# Patient Record
Sex: Female | Born: 1965 | Race: White | Hispanic: No | Marital: Married | Smoking: Current every day smoker
Health system: Southern US, Community
[De-identification: ages and names within clinical notes are randomized; demographics above are authoritative.]

## PROBLEM LIST (undated history)

## (undated) DIAGNOSIS — S42009A Fracture of unspecified part of unspecified clavicle, initial encounter for closed fracture: Secondary | ICD-10-CM

## (undated) DIAGNOSIS — I10 Essential (primary) hypertension: Secondary | ICD-10-CM

## (undated) DIAGNOSIS — Z8701 Personal history of pneumonia (recurrent): Secondary | ICD-10-CM

## (undated) DIAGNOSIS — F411 Generalized anxiety disorder: Secondary | ICD-10-CM

## (undated) DIAGNOSIS — F329 Major depressive disorder, single episode, unspecified: Secondary | ICD-10-CM

## (undated) DIAGNOSIS — F32A Depression, unspecified: Secondary | ICD-10-CM

## (undated) HISTORY — DX: Depression, unspecified: F32.A

## (undated) HISTORY — DX: Essential (primary) hypertension: I10

## (undated) HISTORY — DX: Major depressive disorder, single episode, unspecified: F32.9

## (undated) HISTORY — DX: Generalized anxiety disorder: F41.1

## (undated) HISTORY — DX: Personal history of pneumonia (recurrent): Z87.01

## (undated) HISTORY — PX: EYE SURGERY: SHX253

## (undated) HISTORY — PX: BREAST SURGERY: SHX581

---

## 2001-08-23 ENCOUNTER — Emergency Department (HOSPITAL_COMMUNITY): Admission: EM | Admit: 2001-08-23 | Discharge: 2001-08-23 | Payer: Self-pay | Admitting: *Deleted

## 2012-03-25 ENCOUNTER — Encounter: Payer: Self-pay | Admitting: Gastroenterology

## 2012-03-25 ENCOUNTER — Other Ambulatory Visit: Payer: Self-pay

## 2012-03-25 ENCOUNTER — Ambulatory Visit (INDEPENDENT_AMBULATORY_CARE_PROVIDER_SITE_OTHER): Payer: Self-pay | Admitting: Gastroenterology

## 2012-03-25 VITALS — BP 164/100 | HR 64 | Ht 65.0 in | Wt 189.4 lb

## 2012-03-25 DIAGNOSIS — I10 Essential (primary) hypertension: Secondary | ICD-10-CM

## 2012-03-25 DIAGNOSIS — R197 Diarrhea, unspecified: Secondary | ICD-10-CM

## 2012-03-25 DIAGNOSIS — R109 Unspecified abdominal pain: Secondary | ICD-10-CM

## 2012-03-25 MED ORDER — MOVIPREP 100 G PO SOLR: 1.0000 | ORAL | Status: DC

## 2012-03-25 NOTE — Patient Instructions (Addendum)
One of your biggest health concerns is your smoking.  This increases your risk for most cancers and serious cardiovascular diseases such as strokes, heart attacks.  You should try your best to stop.  If you need assistance, please contact your PCP or Smoking Cessation Class at [facility] [phone]) or [facility] Quit-Line (1-800-QUIT-NOW). WE will get records from Dr. Mellody Life office (labs recently). You will be set up for a colonoscopy (LEC) for diarrhea, minor rectal bleeding. You will have labs checked today in the basement lab.  Please head down after you check out with the front desk  (celiac panel), if this is positive then you will need EGD at same time as colonoscopy. Start taking imodium (1-2 pills) every morning shortly after waking up.  Don't wait for diarrhea to happen.

## 2012-03-25 NOTE — Progress Notes (Signed)
HPI: This is a   very pleasant 46 year old woman whom I am meeting for the first time today.  She has had contstant diarrhea, 8-10 times a day.  4-5 days in a week since xmas.   Prior to xmas she had diarrhea about once a week.  Never nocturnal symptoms.  Mornings are the worst.  She has been taking some of her mother pills (they were very tiny white pills, not imodium).  She takes imodium 4-5 times a week, 4 pills at that time.  She has seen blood in her stool.  Never colonoscopy.  Over a year since any antibiotics.  She had blood work earlier this week by her primary care office and tells me her potassium level was low, her thyroid was normal. I do not have those records.  Pains, post prandial pains.   Lower abd pains.  Not a cramping, but  A bloating.  Sort of a muscular soreness.   Not related to moving her bowels.  Gaining weight.  Her grandfather had colon cancer.  Drinks infrequent caffeine.  Drinks one glass of wine at dinner.      Review of systems: Pertinent positive and negative review of systems were noted in the above HPI section. Complete review of systems was performed and was otherwise normal.    Past Medical History  Diagnosis Date  . Anxiety state, unspecified   . Essential hypertension, benign   . Depression   . History of pneumonia     Past Surgical History  Procedure Date  . Eye surgery     left    Current Outpatient Prescriptions  Medication Sig Dispense Refill  . ALPRAZolam (XANAX) 0.5 MG tablet Take 0.5 mg by mouth at bedtime as needed.      Marland Kitchen atenolol (TENORMIN) 50 MG tablet Take by mouth daily.      . Loperamide HCl (IMODIUM A-D PO) Take by mouth as needed.      Marland Kitchen PARoxetine (PAXIL) 20 MG tablet Take 20 mg by mouth 2 (two) times daily.        Allergies as of 03/25/2012 - Review Complete 03/25/2012  Allergen Reaction Noted  . Codeine  03/25/2012    Family History  Problem Relation Age of Onset  . Breast cancer Paternal Grandmother   .  Breast cancer Mother   . Ovarian cancer Maternal Grandmother   . Prostate cancer Father   . Stomach cancer Cousin   . Colon cancer Paternal Grandfather   . Diabetes Mother     pre-diabetes  . Stroke Maternal Grandfather     History   Social History  . Marital Status: Divorced    Spouse Name: N/A    Number of Children: 0  . Years of Education: N/A   Occupational History  . student    Social History Main Topics  . Smoking status: Current Everyday Smoker    Types: Cigarettes  . Smokeless tobacco: Never Used  . Alcohol Use: Yes     1 per day  . Drug Use: No  . Sexually Active: Not on file   Other Topics Concern  . Not on file   Social History Narrative  . No narrative on file       Physical Exam: BP 164/100  Pulse 64  Ht 5\' 5"  (1.651 m)  Wt 189 lb 6 oz (85.9 kg)  BMI 31.51 kg/m2  LMP 08/26/2011 Constitutional: generally well-appearing Psychiatric: alert and oriented x3 Eyes: extraocular movements intact Mouth: oral pharynx moist, no  lesions Neck: supple no lymphadenopathy Cardiovascular: heart regular rate and rhythm Lungs: clear to auscultation bilaterally Abdomen: soft, nontender, nondistended, no obvious ascites, no peritoneal signs, normal bowel sounds Extremities: no lower extremity edema bilaterally Skin: no lesions on visible extremities    Assessment and plan: 47 y.o. female with  chronic diarrhea, bloating, abdominal pains  We are going to get records sent over from her primary care office. She needs a colonoscopy for her diarrhea, intermittent rectal bleeding. My testing for celiac sprue as well by blood work and if the blood work is positive then she will require EGD at the same time as her colonoscopy. I recommended she start taking Imodium on a daily schedule basis rather than waiting for the diarrhea to happen.

## 2012-03-28 ENCOUNTER — Other Ambulatory Visit: Payer: Self-pay | Admitting: Gastroenterology

## 2012-03-28 ENCOUNTER — Encounter: Payer: Self-pay | Admitting: Gastroenterology

## 2012-03-28 ENCOUNTER — Ambulatory Visit (AMBULATORY_SURGERY_CENTER): Payer: Self-pay | Admitting: Gastroenterology

## 2012-03-28 VITALS — BP 154/101 | HR 90 | Temp 99.0°F | Resp 20 | Ht 65.0 in | Wt 189.0 lb

## 2012-03-28 DIAGNOSIS — D126 Benign neoplasm of colon, unspecified: Secondary | ICD-10-CM

## 2012-03-28 DIAGNOSIS — R197 Diarrhea, unspecified: Secondary | ICD-10-CM

## 2012-03-28 DIAGNOSIS — R109 Unspecified abdominal pain: Secondary | ICD-10-CM

## 2012-03-28 LAB — CELIAC PANEL 10
Gliadin IgA: 8.2 U/mL (ref <20)
Gliadin IgG: 14.1 U/mL (ref <20)
IgA: 160 mg/dL (ref 69–380)

## 2012-03-28 MED ORDER — SODIUM CHLORIDE 0.9 % IV SOLN: 500.0000 mL | INTRAVENOUS | Status: DC

## 2012-03-28 NOTE — Op Note (Signed)
[facility] [address] [place], [place]  [address]  COLONOSCOPY PROCEDURE REPORT  PATIENT:  [name], Tara  MR#:  [id number] BIRTHDATE:  01/09/1966, 45 yrs. old  GENDER:  female ENDOSCOPIST:  Rachael Fee, MD REF. BY:  Marjory Lies, M.D. PROCEDURE DATE:  03/28/2012 PROCEDURE:  Colonoscopy with biopsy ASA CLASS:  Class II INDICATIONS:  chronic diarrhea MEDICATIONS:   Fentanyl 100 mcg IV, These medications were titrated to patient response per physician's verbal order, Versed 10 mg IV  DESCRIPTION OF PROCEDURE:   After the risks benefits and alternatives of the procedure were thoroughly explained, informed consent was obtained.  Digital rectal exam was performed and revealed no rectal masses.   The LB [id number] endoscope was introduced through the anus and advanced to the terminal ileum which was intubated for a short distance, without limitations. The quality of the prep was good..  The instrument was then slowly withdrawn as the colon was fully examined. <<PROCEDUREIMAGES>> FINDINGS:  The terminal ileum appeared normal (see image3).  A normal appearing cecum, ileocecal valve, and appendiceal orifice were identified. The ascending, hepatic flexure, transverse, splenic flexure, descending, sigmoid colon, and rectum appeared unremarkable. The colon was randomly biopsied to check for microsopic colitis, sent to pathology (jar 1) (see image2, image5, and image4).   Retroflexed views in the rectum revealed no abnormalities. COMPLICATIONS:  None  ENDOSCOPIC IMPRESSION: 1) Normal terminal ileum 2) Normal colon, randomly biopsied to check for microscopic colitis  RECOMMENDATIONS: Await final pathology and celiac sprue panel (drawn this past Friday)  ______________________________ Rachael Fee, MD  n. eSIGNED:   Rachael Fee at 03/28/2012 01:53 PM  [name], Tara, [id number]

## 2012-03-28 NOTE — Progress Notes (Signed)
Patient did not experience any of the following events: a burn prior to discharge; a fall within the facility; wrong site/side/patient/procedure/implant event; or a hospital transfer or hospital admission upon discharge from the facility. (G8907) Patient did not have preoperative order for IV antibiotic SSI prophylaxis. (G8918)  

## 2012-03-28 NOTE — Patient Instructions (Addendum)
One of your biggest health concerns is your smoking.  This increases your risk for most cancers and serious cardiovascular diseases such as strokes, heart attacks.  You should try your best to stop.  If you need assistance, please contact your PCP or Smoking Cessation Class at [place] ([phone]) or [place] Quit-Line (1-800-QUIT-NOW).  Discharge instructions given with verbal understanding. Biopsies taken. Resume previous medications. YOU HAD AN ENDOSCOPIC PROCEDURE TODAY AT THE [facility]: Refer to the procedure report that was given to you for any specific questions about what was found during the examination.  If the procedure report does not answer your questions, please call your gastroenterologist to clarify.  If you requested that your care partner not be given the details of your procedure findings, then the procedure report has been included in a sealed envelope for you to review at your convenience later.  YOU SHOULD EXPECT: Some feelings of bloating in the abdomen. Passage of more gas than usual.  Walking can help get rid of the air that was put into your GI tract during the procedure and reduce the bloating. If you had a lower endoscopy (such as a colonoscopy or flexible sigmoidoscopy) you may notice spotting of blood in your stool or on the toilet paper. If you underwent a bowel prep for your procedure, then you may not have a normal bowel movement for a few days.  DIET: Your first meal following the procedure should be a light meal and then it is ok to progress to your normal diet.  A half-sandwich or bowl of soup is an example of a good first meal.  Heavy or fried foods are harder to digest and may make you feel nauseous or bloated.  Likewise meals heavy in dairy and vegetables can cause extra gas to form and this can also increase the bloating.  Drink plenty of fluids but you should avoid alcoholic beverages for 24 hours.  ACTIVITY: Your care partner should take  you home directly after the procedure.  You should plan to take it easy, moving slowly for the rest of the day.  You can resume normal activity the day after the procedure however you should NOT DRIVE or use heavy machinery for 24 hours (because of the sedation medicines used during the test).    SYMPTOMS TO REPORT IMMEDIATELY: A gastroenterologist can be reached at any hour.  During normal business hours, 8:30 AM to 5:00 PM Monday through Friday, call [phone].  After hours and on weekends, please call the GI answering service at [phone] who will take a message and have the physician on call contact you.   Following lower endoscopy (colonoscopy or flexible sigmoidoscopy):  Excessive amounts of blood in the stool  Significant tenderness or worsening of abdominal pains  Swelling of the abdomen that is new, acute  Fever of 100F or higher  FOLLOW UP: If any biopsies were taken you will be contacted by phone or by letter within the next 1-3 weeks.  Call your gastroenterologist if you have not heard about the biopsies in 3 weeks.  Our staff will call the home number listed on your records the next business day following your procedure to check on you and address any questions or concerns that you may have at that time regarding the information given to you following your procedure. This is a courtesy call and so if there is no answer at the home number and we have not heard from you through the emergency   physician on call, we will assume that you have returned to your regular daily activities without incident.  SIGNATURES/CONFIDENTIALITY: You and/or your care partner have signed paperwork which will be entered into your electronic medical record.  These signatures attest to the fact that that the information above on your After Visit Summary has been reviewed and is understood.  Full responsibility of the confidentiality of this discharge information lies with you and/or your  care-partner. 

## 2012-03-29 ENCOUNTER — Telehealth: Payer: Self-pay

## 2012-03-29 NOTE — Telephone Encounter (Signed)
Left message

## 2012-04-01 ENCOUNTER — Telehealth: Payer: Self-pay | Admitting: Gastroenterology

## 2012-04-01 NOTE — Telephone Encounter (Signed)
Cbc, cmet from late June were normal except K 3.0 and alt was 41 (39 is ULN), alphos 128 (104 ULN)  Awaiting biopsies from last week colonoscopy

## 2012-04-05 ENCOUNTER — Encounter: Payer: Self-pay | Admitting: Gastroenterology

## 2012-07-04 ENCOUNTER — Other Ambulatory Visit: Payer: Self-pay | Admitting: Family Medicine

## 2012-07-07 ENCOUNTER — Telehealth (HOSPITAL_COMMUNITY): Payer: Self-pay | Admitting: *Deleted

## 2012-07-07 NOTE — Telephone Encounter (Signed)
Telephoned home # and left message to return call to [facility] 

## 2012-07-08 ENCOUNTER — Other Ambulatory Visit: Payer: Self-pay | Admitting: Family Medicine

## 2012-08-02 ENCOUNTER — Encounter (HOSPITAL_COMMUNITY): Payer: Self-pay | Admitting: *Deleted

## 2012-08-09 ENCOUNTER — Ambulatory Visit (HOSPITAL_COMMUNITY)
Admission: RE | Admit: 2012-08-09 | Discharge: 2012-08-09 | Disposition: A | Discharge status: Home / Self Care | Payer: Self-pay | Source: Ambulatory Visit | Attending: Obstetrics and Gynecology | Admitting: Obstetrics and Gynecology

## 2012-08-09 ENCOUNTER — Other Ambulatory Visit: Payer: Self-pay | Admitting: Obstetrics and Gynecology

## 2012-08-09 ENCOUNTER — Encounter (HOSPITAL_COMMUNITY): Payer: Self-pay

## 2012-08-09 ENCOUNTER — Other Ambulatory Visit: Payer: Self-pay | Admitting: *Deleted

## 2012-08-09 VITALS — BP 128/82 | Temp 98.2°F | Ht 65.0 in | Wt 186.8 lb

## 2012-08-09 DIAGNOSIS — N898 Other specified noninflammatory disorders of vagina: Secondary | ICD-10-CM

## 2012-08-09 DIAGNOSIS — Z01419 Encounter for gynecological examination (general) (routine) without abnormal findings: Secondary | ICD-10-CM

## 2012-08-09 DIAGNOSIS — N63 Unspecified lump in unspecified breast: Secondary | ICD-10-CM

## 2012-08-09 DIAGNOSIS — N6325 Unspecified lump in the left breast, overlapping quadrants: Secondary | ICD-10-CM | POA: Insufficient documentation

## 2012-08-09 DIAGNOSIS — N6311 Unspecified lump in the right breast, upper outer quadrant: Secondary | ICD-10-CM | POA: Insufficient documentation

## 2012-08-09 NOTE — Assessment & Plan Note (Addendum)
Left breast lump that is moveable at 3 o'clock 4 cm from the nipple. Patient referred to the [facility] for bilateral diagnostic mammogram and possible ultrasound. Appointment scheduled for Tuesday, August 16, 2012 at 1350.

## 2012-08-09 NOTE — Addendum Note (Signed)
Encounter addended by: Lurlean Horns, LPN on: 78/29/5621  2:41 PM<BR>     Documentation filed: Orders

## 2012-08-09 NOTE — Addendum Note (Signed)
Encounter addended by: Lurlean Horns, LPN on: 16/06/9603  2:44 PM<BR>     Documentation filed: Orders

## 2012-08-09 NOTE — Patient Instructions (Signed)
Taught patient how to perform BSE and gave educational materials to take home.  Let her know she will need a Pap smear in 1 year if today's Pap smear is normal since she has never had a Pap smear before. Let her know after 3 normal Pap smears [facility] will cover Pap smears every 3 years unless has a history of abnormal Pap smears. Let her know about the free cervical cancer screenings offered by the Cancer Center. Patient referred to the [facility] for bilateral diagnostic mammogram and possible ultrasound. Appointment scheduled for Tuesday, August 16, 2012 at 1350. Patient aware of appointment and will be there. Let patient know will follow up with her within the next couple weeks with results by letter or phone. Discussed smoking cessation with patient. Let her know about the free smoking cessation classes offered by the Cancer Center. Told patient about the Quit line and patient completed a referral form. Patient verbalized understanding.

## 2012-08-09 NOTE — Assessment & Plan Note (Signed)
Wet prep completed.

## 2012-08-09 NOTE — Progress Notes (Signed)
Complaints of left breast lump x 4 months. Per patient she has never had a mammogram.  Pap Smear:    Pap smear completed today. Per patient she has never had a Pap smear.   Physical exam: Breasts Breasts symmetrical. Bilateral breasts under nipple area yeast like appearance. No nipple retraction bilateral breasts. No nipple discharge bilateral breasts. No lymphadenopathy. Palpated moveable lump left breast at 3 o'clock 4 cm from the nipple. Palpated right breast lump at 10 o'clock 3 cm from the nipple. No complaints of pain or tenderness on exam. Patient referred to the [facility] of [facility] for bilateral diagnostic mammogram and possible ultrasound. Appointment scheduled for Tuesday, August 16, 2012 at 1350.          Pelvic/Bimanual   Ext Genitalia No lesions, no swelling and no discharge observed on external genitalia.         Vagina Vagina reddish colored and normal texture. No lesions and thick white discharge observed in vagina. Wet prep completed.          Cervix Cervix is present. Cervix pink and of normal texture. Thick white discharge observed on cervix.     Uterus Uterus is present and palpable. Uterus in normal position and normal size.        Adnexae Bilateral ovaries present and palpable. No tenderness on palpation.          Rectovaginal No rectal exam completed today since patient had no rectal complaints. Skin in buttocks and inner thighs had yeast like appearance.

## 2012-08-09 NOTE — Assessment & Plan Note (Signed)
Right breast lump at 10 o'clock 3 cm from the nipple. Patient referred to the [facility] for bilateral diagnostic mammogram and possible ultrasound. Appointment scheduled for Tuesday, August 16, 2012 at 1350.

## 2012-08-10 ENCOUNTER — Telehealth (HOSPITAL_COMMUNITY): Payer: Self-pay | Admitting: *Deleted

## 2012-08-10 ENCOUNTER — Other Ambulatory Visit: Payer: Self-pay | Admitting: Obstetrics and Gynecology

## 2012-08-10 MED ORDER — NYSTATIN 100000 UNIT/GM EX CREA: TOPICAL_CREAM | Freq: Two times a day (BID) | CUTANEOUS | Status: DC

## 2012-08-10 MED ORDER — FLUCONAZOLE 150 MG PO TABS: 150.0000 mg | ORAL_TABLET | Freq: Once | ORAL | Status: DC

## 2012-08-10 NOTE — Telephone Encounter (Signed)
Telephoned [facility] and called in Rx Nystatin cream and diflucan. Telephoned patient at home # and left message to return call to [facility]

## 2012-08-10 NOTE — Telephone Encounter (Signed)
Patient returned call to [facility] and advised meds were called in to [facility]. Also advised per physician patient would need to be tested for diabetes. Patient voiced understanding.

## 2012-08-11 LAB — WET PREP, GENITAL
Clue Cells Wet Prep HPF POC: NONE SEEN
Trich, Wet Prep: NONE SEEN
Yeast Wet Prep HPF POC: NONE SEEN

## 2012-08-16 ENCOUNTER — Ambulatory Visit
Admission: RE | Admit: 2012-08-16 | Discharge: 2012-08-16 | Disposition: A | Discharge status: Home / Self Care | Payer: No Typology Code available for payment source | Source: Ambulatory Visit | Attending: Obstetrics and Gynecology | Admitting: Obstetrics and Gynecology

## 2012-08-16 DIAGNOSIS — N63 Unspecified lump in unspecified breast: Secondary | ICD-10-CM

## 2012-08-16 IMAGING — MG MM DIGITAL DIAGNOSTIC BILAT
5 series · 5 of 5 positions shown · non-contrast
Comparison: None

CLINICAL DATA: 46-year-old female with palpable lump in the upper
outer left breast discovered on self examination and palpable
thickening in the upper outer right breast discovered on clinical
examination.

DIGITAL DIAGNOSTIC BILATERAL MAMMOGRAM WITH CAD AND BILATERAL
BREAST ULTRASOUND:

[R CC]
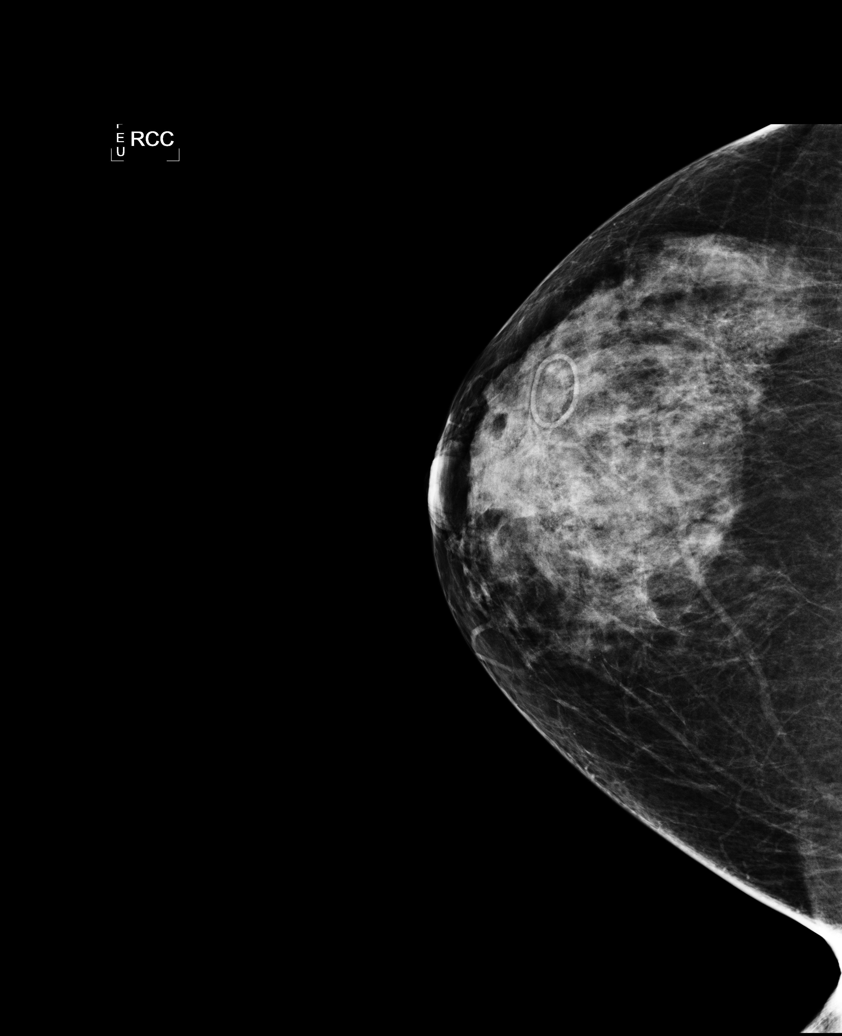

[L CC]
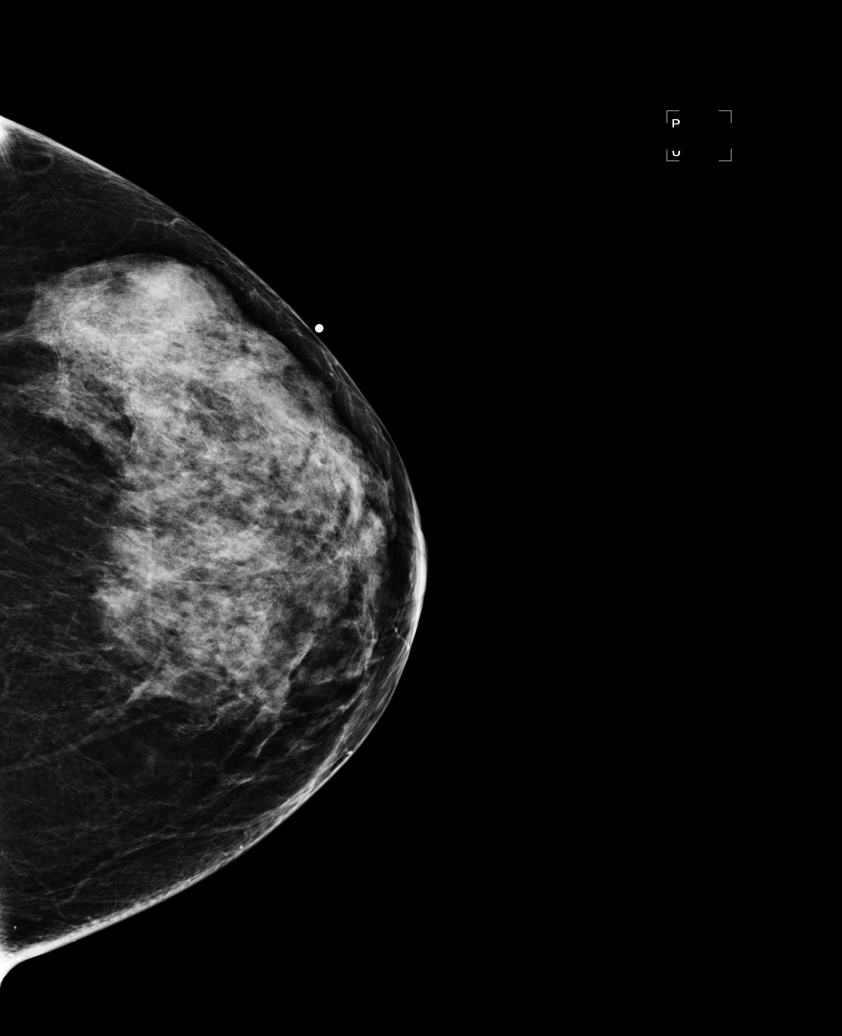

[L MLO]
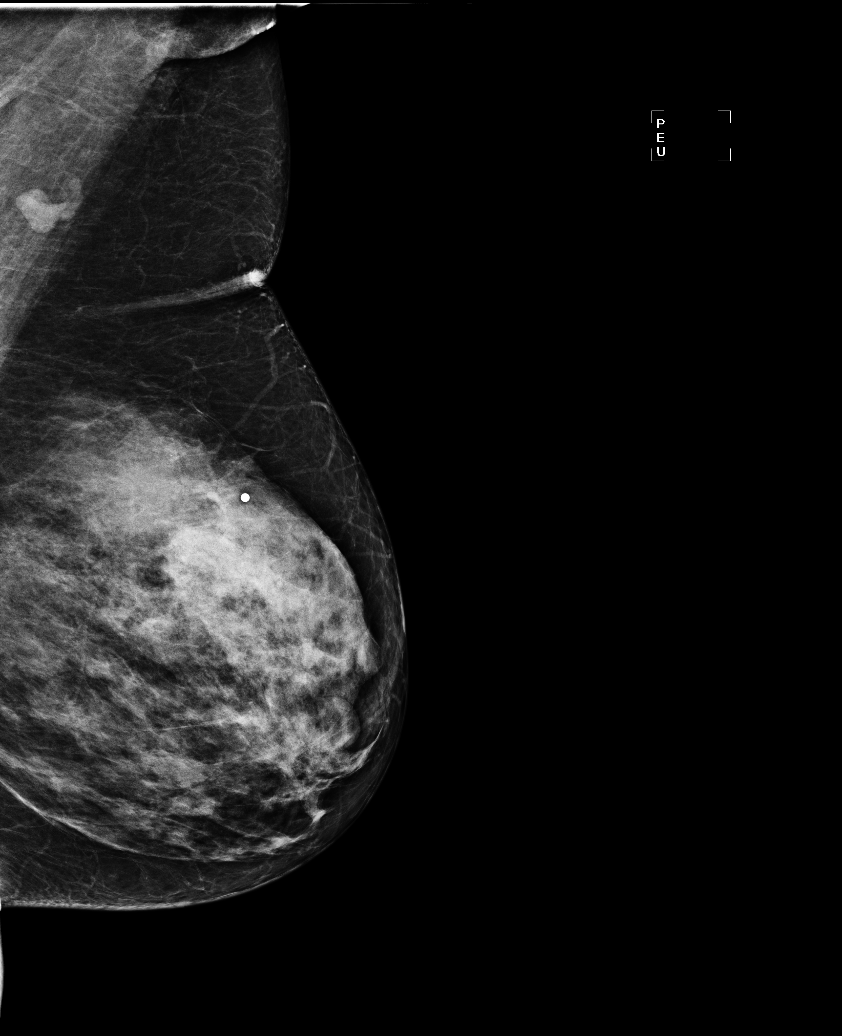

[R MLO]
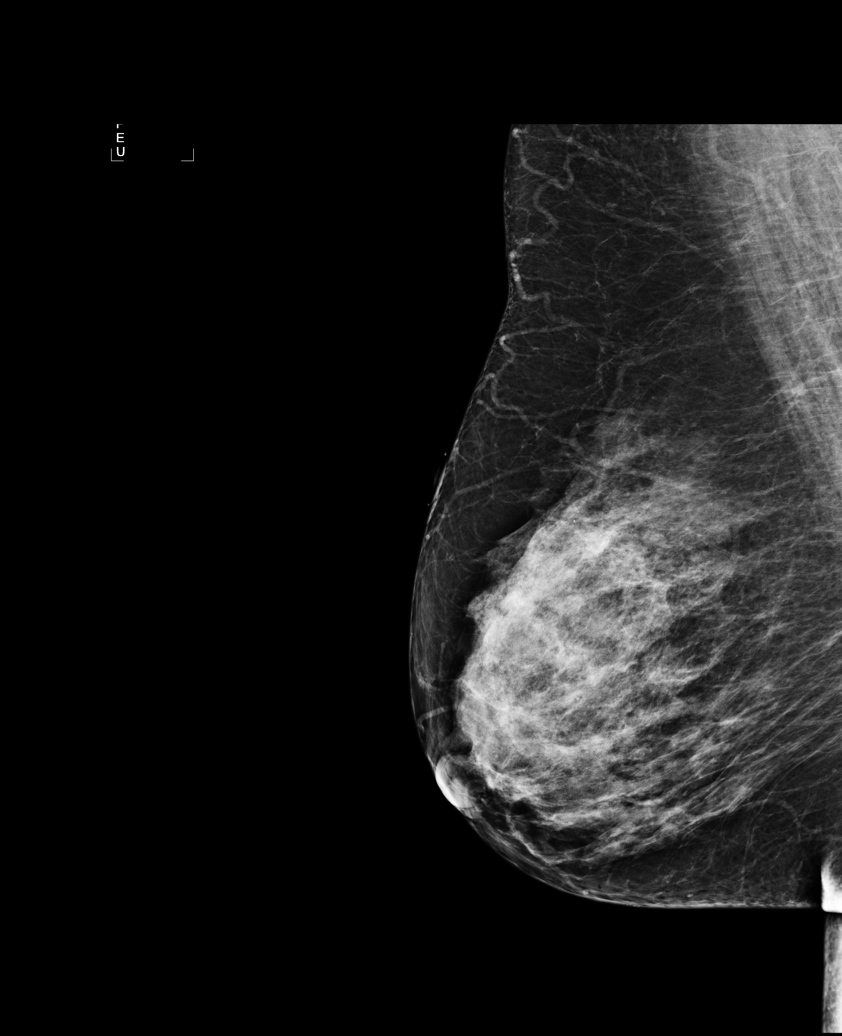

[L TAN]
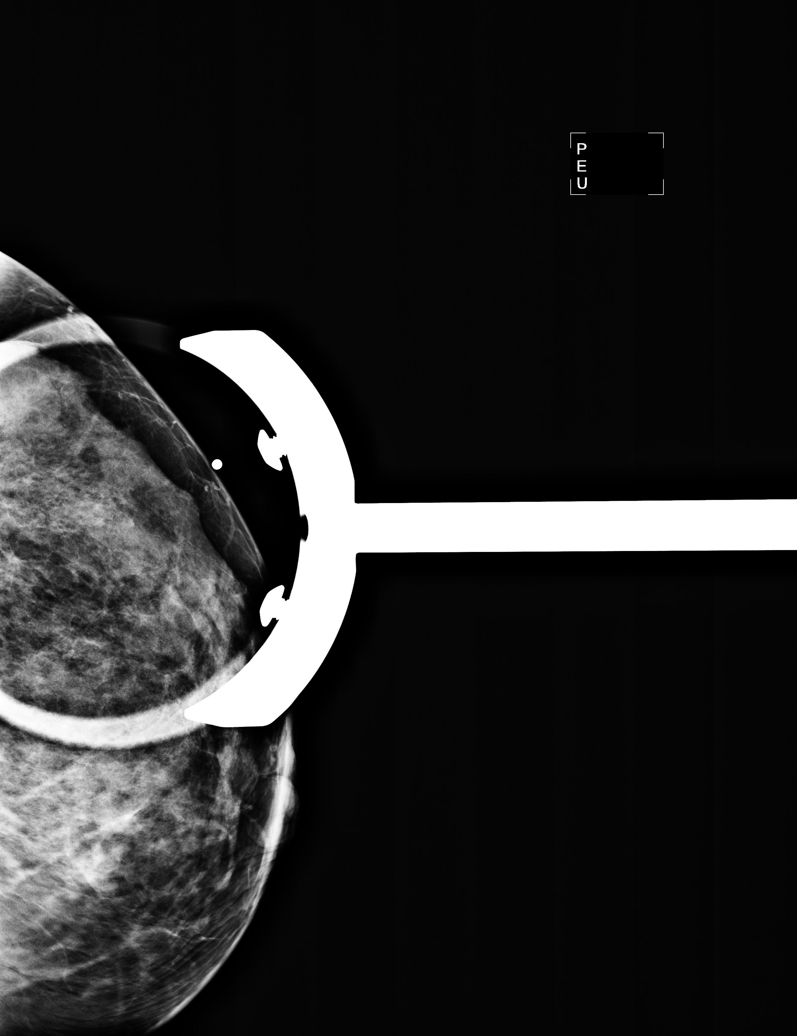

[5 of 5 positions shown; findings below may reference images not displayed]

FINDINGS: A spot compression view of the left breast and routine
views of both breasts demonstrate heterogeneously dense breast
tissue.
A faint circumscribed mass in the upper outer left breast is
present.
No other mass, distortion or suspicious calcifications are noted
bilaterally.
Mammographic images were processed with CAD.

On physical exam, a firm palpable mobile mass is identified at the
2 o'clock position of the left breast 5 cm from the nipple.  There
is no evidence of palpable abnormality within the upper outer right
breast.

Ultrasound is performed, showing a 1.3 x 1 x 1.7 cm complicated
cyst with mobile internal echoes at the 2 o'clock position of the
left breast 5 cm from the nipple.
There is no evidence of solid or cystic mass, distortion or
abnormal areas of shadowing within the upper outer right breast, in
the area of palpable concern.
IMPRESSION: 1.3 x 1 x 1.7 cm benign complicated cyst in the upper outer left
breast, corresponding to the patient's palpable abnormality.

No mammographic, palpable or sonographic abnormality identified
within the upper outer right breast, in the area of palpable
concern.

BI-RADS CATEGORY 2:  Benign finding(s).

RECOMMENDATION:
Bilateral screening mammograms in 1 year

I discussed the findings and recommendations with the patient and
her questions answered.  She was encouraged to begin/continue
monthly self exams and to contact her primary physician if any
changes noted. A written report was given to the patient.

## 2012-09-29 NOTE — Addendum Note (Signed)
Encounter addended by: Saintclair Halsted, RN on: 09/29/2012  4:22 PM<BR>     Documentation filed: Charges VN

## 2014-06-14 ENCOUNTER — Emergency Department (HOSPITAL_COMMUNITY): Payer: Self-pay

## 2014-06-14 ENCOUNTER — Inpatient Hospital Stay (HOSPITAL_COMMUNITY)
Admission: EM | Admit: 2014-06-14 | Discharge: 2014-06-16 | DRG: 641 | Discharge status: Against Medical Advice | Payer: Self-pay | Attending: Internal Medicine | Admitting: Internal Medicine

## 2014-06-14 ENCOUNTER — Encounter (HOSPITAL_COMMUNITY): Payer: Self-pay | Admitting: Emergency Medicine

## 2014-06-14 DIAGNOSIS — E876 Hypokalemia: Principal | ICD-10-CM

## 2014-06-14 DIAGNOSIS — I7389 Other specified peripheral vascular diseases: Secondary | ICD-10-CM | POA: Diagnosis present

## 2014-06-14 DIAGNOSIS — Z79899 Other long term (current) drug therapy: Secondary | ICD-10-CM

## 2014-06-14 DIAGNOSIS — D696 Thrombocytopenia, unspecified: Secondary | ICD-10-CM | POA: Diagnosis present

## 2014-06-14 DIAGNOSIS — I10 Essential (primary) hypertension: Secondary | ICD-10-CM

## 2014-06-14 DIAGNOSIS — F419 Anxiety disorder, unspecified: Secondary | ICD-10-CM

## 2014-06-14 DIAGNOSIS — F1020 Alcohol dependence, uncomplicated: Secondary | ICD-10-CM | POA: Diagnosis present

## 2014-06-14 DIAGNOSIS — R209 Unspecified disturbances of skin sensation: Secondary | ICD-10-CM

## 2014-06-14 DIAGNOSIS — R9431 Abnormal electrocardiogram [ECG] [EKG]: Secondary | ICD-10-CM

## 2014-06-14 DIAGNOSIS — F10239 Alcohol dependence with withdrawal, unspecified: Secondary | ICD-10-CM | POA: Diagnosis present

## 2014-06-14 DIAGNOSIS — F411 Generalized anxiety disorder: Secondary | ICD-10-CM | POA: Diagnosis present

## 2014-06-14 DIAGNOSIS — Z720 Tobacco use: Secondary | ICD-10-CM

## 2014-06-14 DIAGNOSIS — F17200 Nicotine dependence, unspecified, uncomplicated: Secondary | ICD-10-CM | POA: Diagnosis present

## 2014-06-14 DIAGNOSIS — F10939 Alcohol use, unspecified with withdrawal, unspecified: Secondary | ICD-10-CM

## 2014-06-14 LAB — CBC WITH DIFFERENTIAL/PLATELET
Basophils Absolute: 0 10*3/uL (ref 0.0–0.1)
Basophils Relative: 1 % (ref 0–1)
Eosinophils Absolute: 0 10*3/uL (ref 0.0–0.7)
Eosinophils Relative: 1 % (ref 0–5)
HEMATOCRIT: 46.9 % — AB (ref 36.0–46.0)
HEMOGLOBIN: 16.4 g/dL — AB (ref 12.0–15.0)
LYMPHS PCT: 28 % (ref 12–46)
Lymphs Abs: 1.9 10*3/uL (ref 0.7–4.0)
MCH: 37.1 pg — ABNORMAL HIGH (ref 26.0–34.0)
MCHC: 35 g/dL (ref 30.0–36.0)
MCV: 106.1 fL — AB (ref 78.0–100.0)
MONO ABS: 0.5 10*3/uL (ref 0.1–1.0)
MONOS PCT: 7 % (ref 3–12)
NEUTROS ABS: 4.5 10*3/uL (ref 1.7–7.7)
Neutrophils Relative %: 64 % (ref 43–77)
Platelets: 111 10*3/uL — ABNORMAL LOW (ref 150–400)
RBC: 4.42 MIL/uL (ref 3.87–5.11)
RDW: 14.7 % (ref 11.5–15.5)
WBC: 7 10*3/uL (ref 4.0–10.5)

## 2014-06-14 LAB — MAGNESIUM: Magnesium: 0.6 mg/dL — CL (ref 1.5–2.5)

## 2014-06-14 LAB — COMPREHENSIVE METABOLIC PANEL
ALBUMIN: 3.6 g/dL (ref 3.5–5.2)
ALK PHOS: 82 U/L (ref 39–117)
ALT: 13 U/L (ref 0–35)
AST: 24 U/L (ref 0–37)
Anion gap: 14 (ref 5–15)
BILIRUBIN TOTAL: 1.9 mg/dL — AB (ref 0.3–1.2)
BUN: 20 mg/dL (ref 6–23)
CHLORIDE: 97 meq/L (ref 96–112)
CO2: 29 meq/L (ref 19–32)
Calcium: 8.5 mg/dL (ref 8.4–10.5)
Creatinine, Ser: 0.95 mg/dL (ref 0.50–1.10)
GFR calc Af Amer: 83 mL/min — ABNORMAL LOW (ref >90)
GFR, EST NON AFRICAN AMERICAN: 71 mL/min — AB (ref >90)
Glucose, Bld: 82 mg/dL (ref 70–99)
POTASSIUM: 3 meq/L — AB (ref 3.7–5.3)
SODIUM: 140 meq/L (ref 137–147)
Total Protein: 6.3 g/dL (ref 6.0–8.3)

## 2014-06-14 LAB — URINE MICROSCOPIC-ADD ON

## 2014-06-14 LAB — RAPID URINE DRUG SCREEN, HOSP PERFORMED
AMPHETAMINES: NOT DETECTED
BARBITURATES: NOT DETECTED
BENZODIAZEPINES: NOT DETECTED
COCAINE: NOT DETECTED
Opiates: NOT DETECTED
TETRAHYDROCANNABINOL: NOT DETECTED

## 2014-06-14 LAB — URINALYSIS, ROUTINE W REFLEX MICROSCOPIC
GLUCOSE, UA: NEGATIVE mg/dL
HGB URINE DIPSTICK: NEGATIVE
KETONES UR: NEGATIVE mg/dL
Leukocytes, UA: NEGATIVE
Nitrite: NEGATIVE
PH: 5 (ref 5.0–8.0)
PROTEIN: 30 mg/dL — AB
Specific Gravity, Urine: >1.046 — ABNORMAL HIGH (ref 1.005–1.030)
Urobilinogen, UA: 1 mg/dL (ref 0.0–1.0)

## 2014-06-14 LAB — I-STAT CHEM 8, ED
BUN: 19 mg/dL (ref 6–23)
CALCIUM ION: 0.98 mmol/L — AB (ref 1.12–1.23)
CHLORIDE: 91 meq/L — AB (ref 96–112)
Creatinine, Ser: 0.9 mg/dL (ref 0.50–1.10)
GLUCOSE: 96 mg/dL (ref 70–99)
HCT: 54 % — ABNORMAL HIGH (ref 36.0–46.0)
Hemoglobin: 18.4 g/dL — ABNORMAL HIGH (ref 12.0–15.0)
Potassium: 2.5 mEq/L — CL (ref 3.7–5.3)
Sodium: 135 mEq/L — ABNORMAL LOW (ref 137–147)
TCO2: 32 mmol/L (ref 0–100)

## 2014-06-14 LAB — POTASSIUM: Potassium: 2.7 mEq/L — CL (ref 3.7–5.3)

## 2014-06-14 LAB — TSH: TSH: 1.05 u[IU]/mL (ref 0.350–4.500)

## 2014-06-14 LAB — TROPONIN I: Troponin I: <0.3 ng/mL (ref <0.30)

## 2014-06-14 LAB — ETHANOL: Alcohol, Ethyl (B): <11 mg/dL (ref 0–11)

## 2014-06-14 LAB — RPR

## 2014-06-14 IMAGING — CT CT ANGIO CHEST
2 of 6 series · 19 of 36 positions shown · IV contrast (OMNIPAQUE 350)
Comparison: Chest radiograph dated [DATE]

CLINICAL DATA: Shortness of breath, tachycardia

EXAM:
CT ANGIOGRAPHY CHEST WITH CONTRAST
TECHNIQUE: Multidetector CT imaging of the chest was performed using the
standard protocol during bolus administration of intravenous
contrast. Multiplanar CT image reconstructions and MIPs were
obtained to evaluate the vascular anatomy.
CONTRAST:  100mL OMNIPAQUE IOHEXOL 350 MG/ML SOLN

[Series 6: thins for pacs · axial · 0.72mm/px · z∈[-178,+30]mm · 18 of 231 slices shown]
[im 12/231  lung]
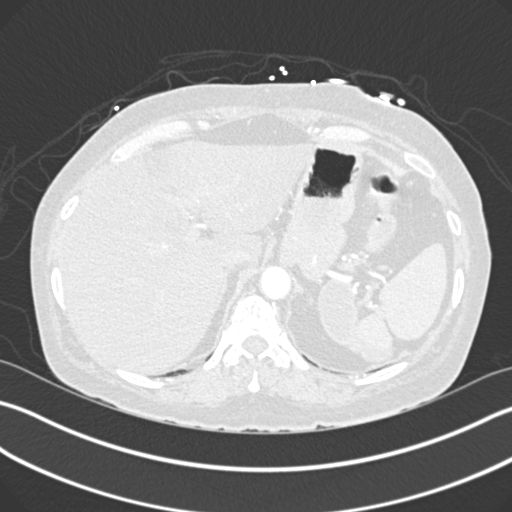
[im 24/231  mediastinal]
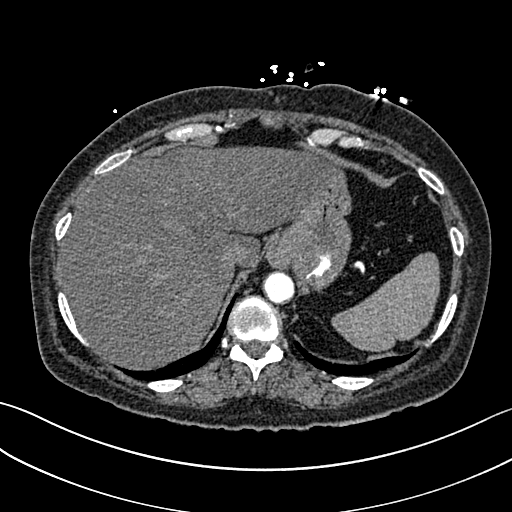
[im 35/231  lung]
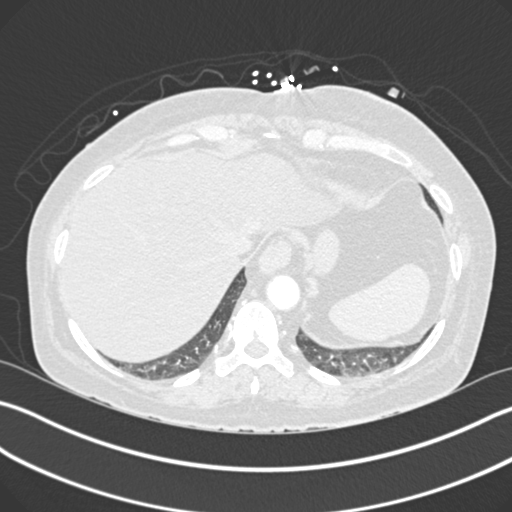
[im 47/231  mediastinal]
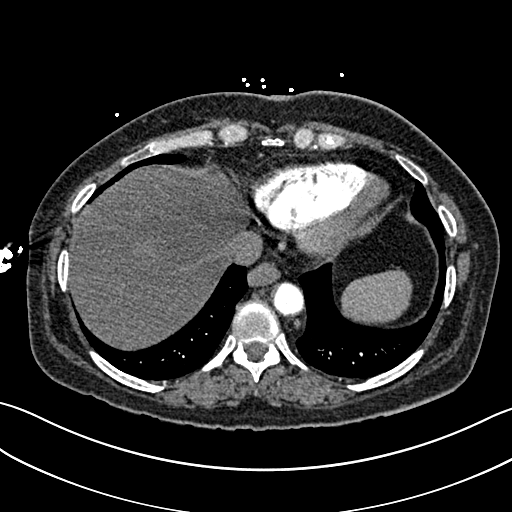
[im 58/231  lung]
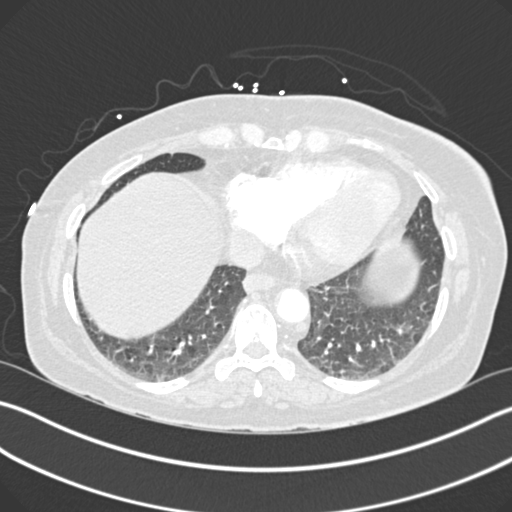
[im 70/231  mediastinal]
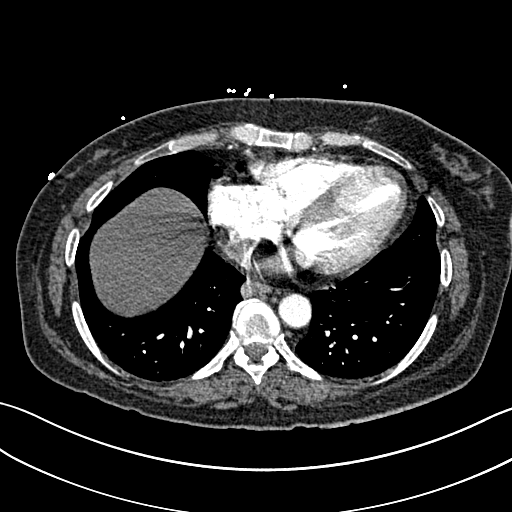
[im 81/231  lung]
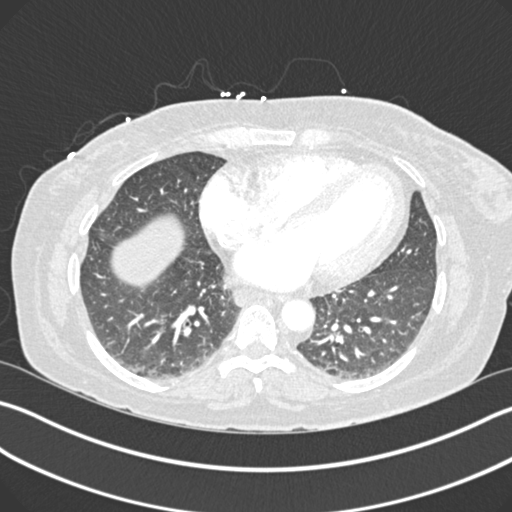
[im 93/231  mediastinal]
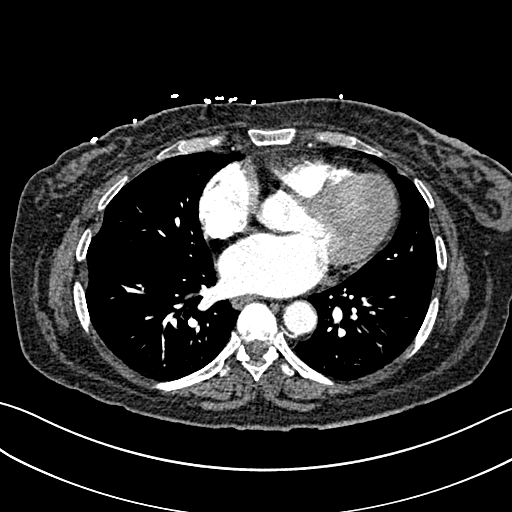
[im 104/231  lung]
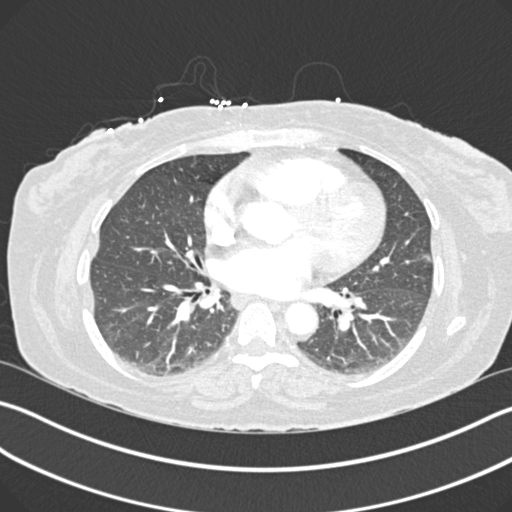
[im 127/231  mediastinal]
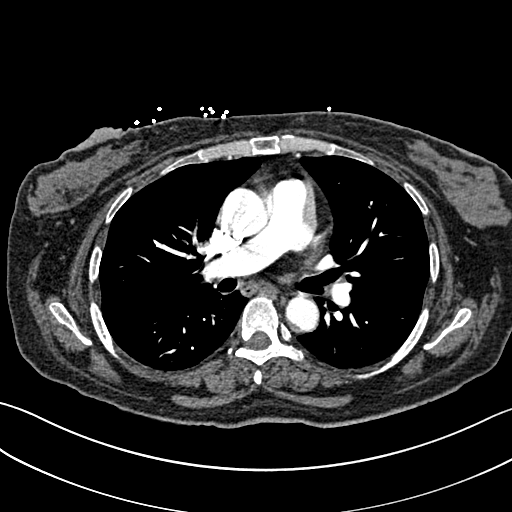
[im 139/231  lung]
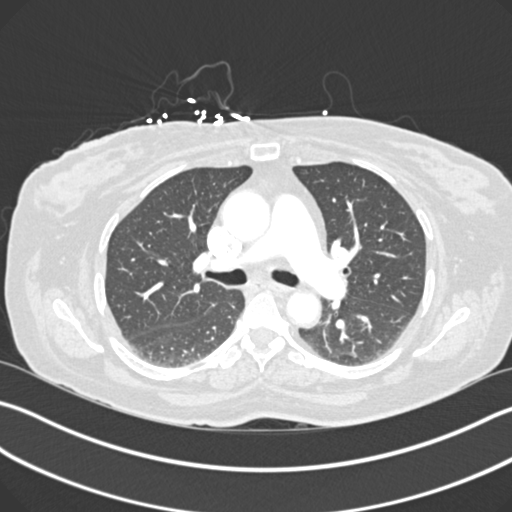
[im 150/231  mediastinal]
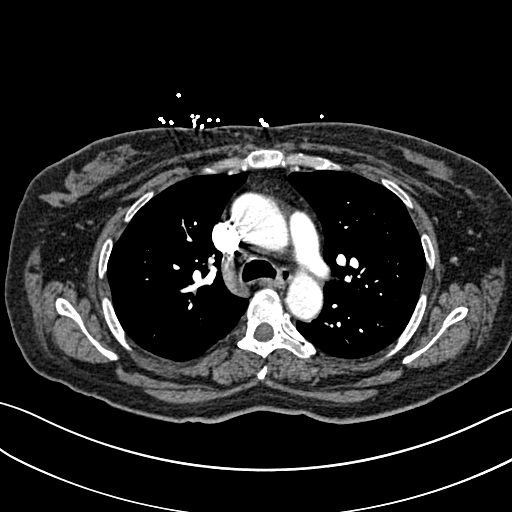
[im 162/231  lung]
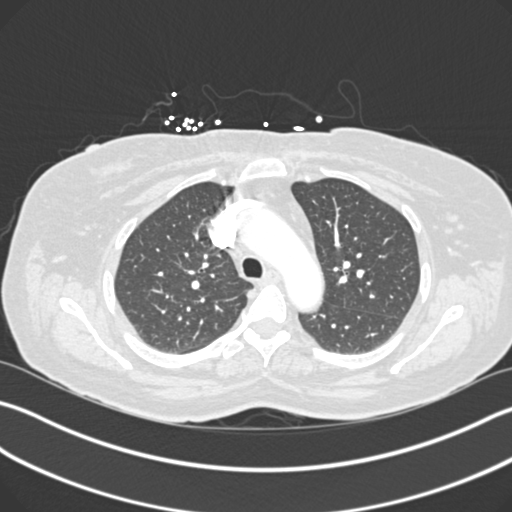
[im 173/231  mediastinal]
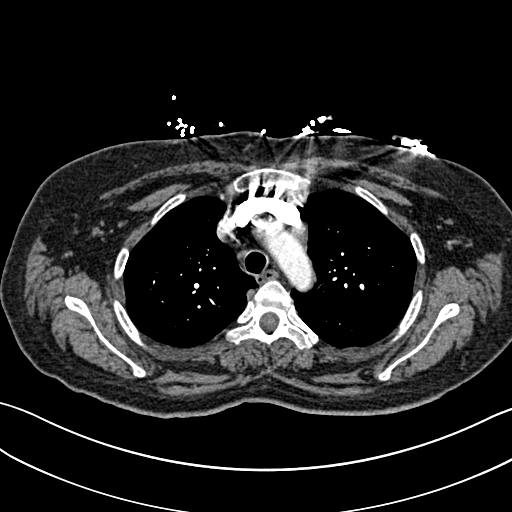
[im 185/231  lung]
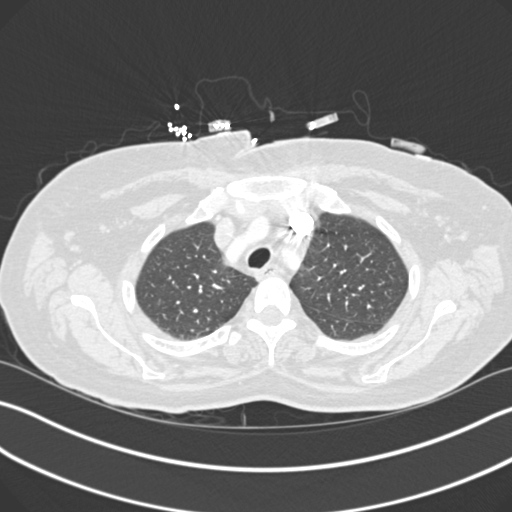
[im 196/231  mediastinal]
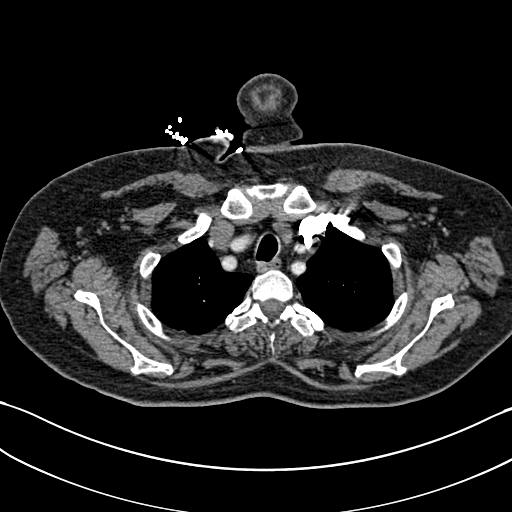
[im 208/231  lung]
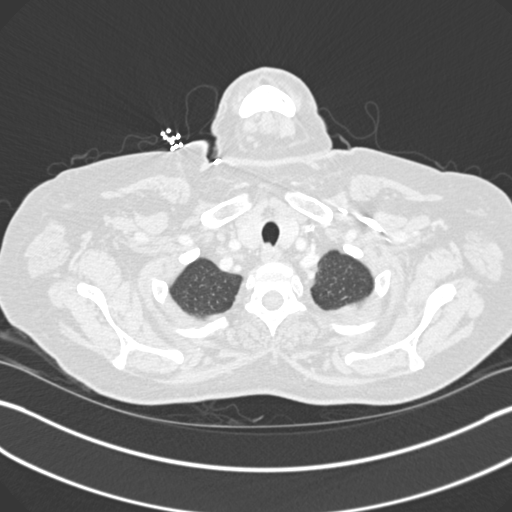
[im 219/231  mediastinal]
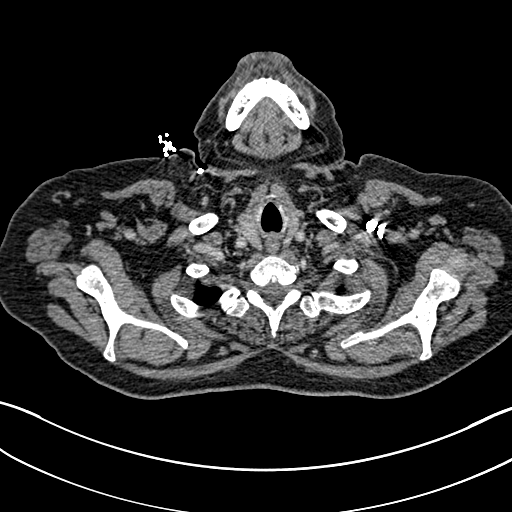

[Series 8: coronal mpr · coronal · 0.45mm/px · 1 of 140 slices shown]
[im 70/140  mediastinal]
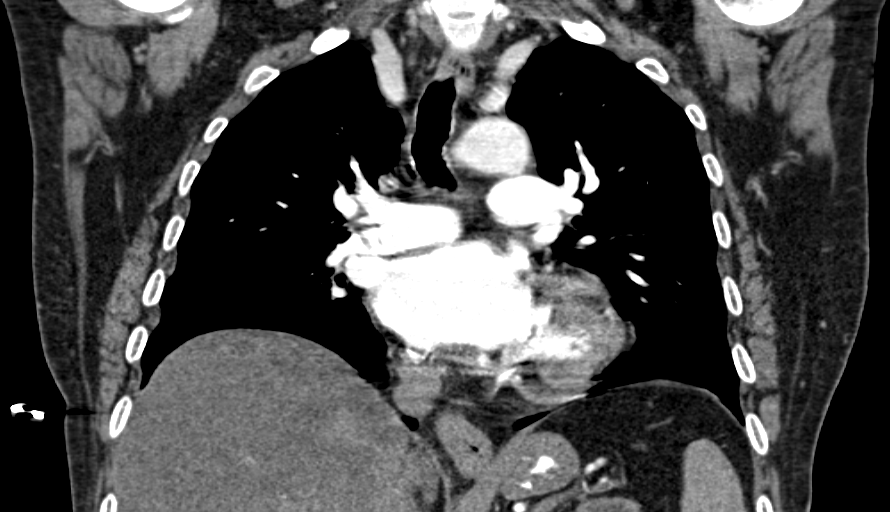

[19 of 36 positions shown; findings below may reference images not displayed]

FINDINGS: No evidence of pulmonary embolism.

Lungs are essentially clear. Minimal dependent atelectasis. No
suspicious pulmonary nodules. No pleural effusion or pneumothorax.

The heart is top-normal in size. No pericardial effusion. Minimal
Coronary atherosclerosis in the LAD.

No suspicious mediastinal, hilar, or axillary lymphadenopathy.

Visualized upper abdomen is unremarkable.

Mild degenerative changes of the mid/lower thoracic spine.

Review of the MIP images confirms the above findings.
IMPRESSION: No evidence of pulmonary embolism.

No evidence of acute cardiopulmonary disease.

## 2014-06-14 IMAGING — CR DG CHEST 1V
1 series · 1 of 1 positions shown · non-contrast
Comparison: None.

CLINICAL DATA: Shortness of breath, PAC intact, history of tobacco
use

EXAM:
CHEST - 1 VIEW

[w chest pa]
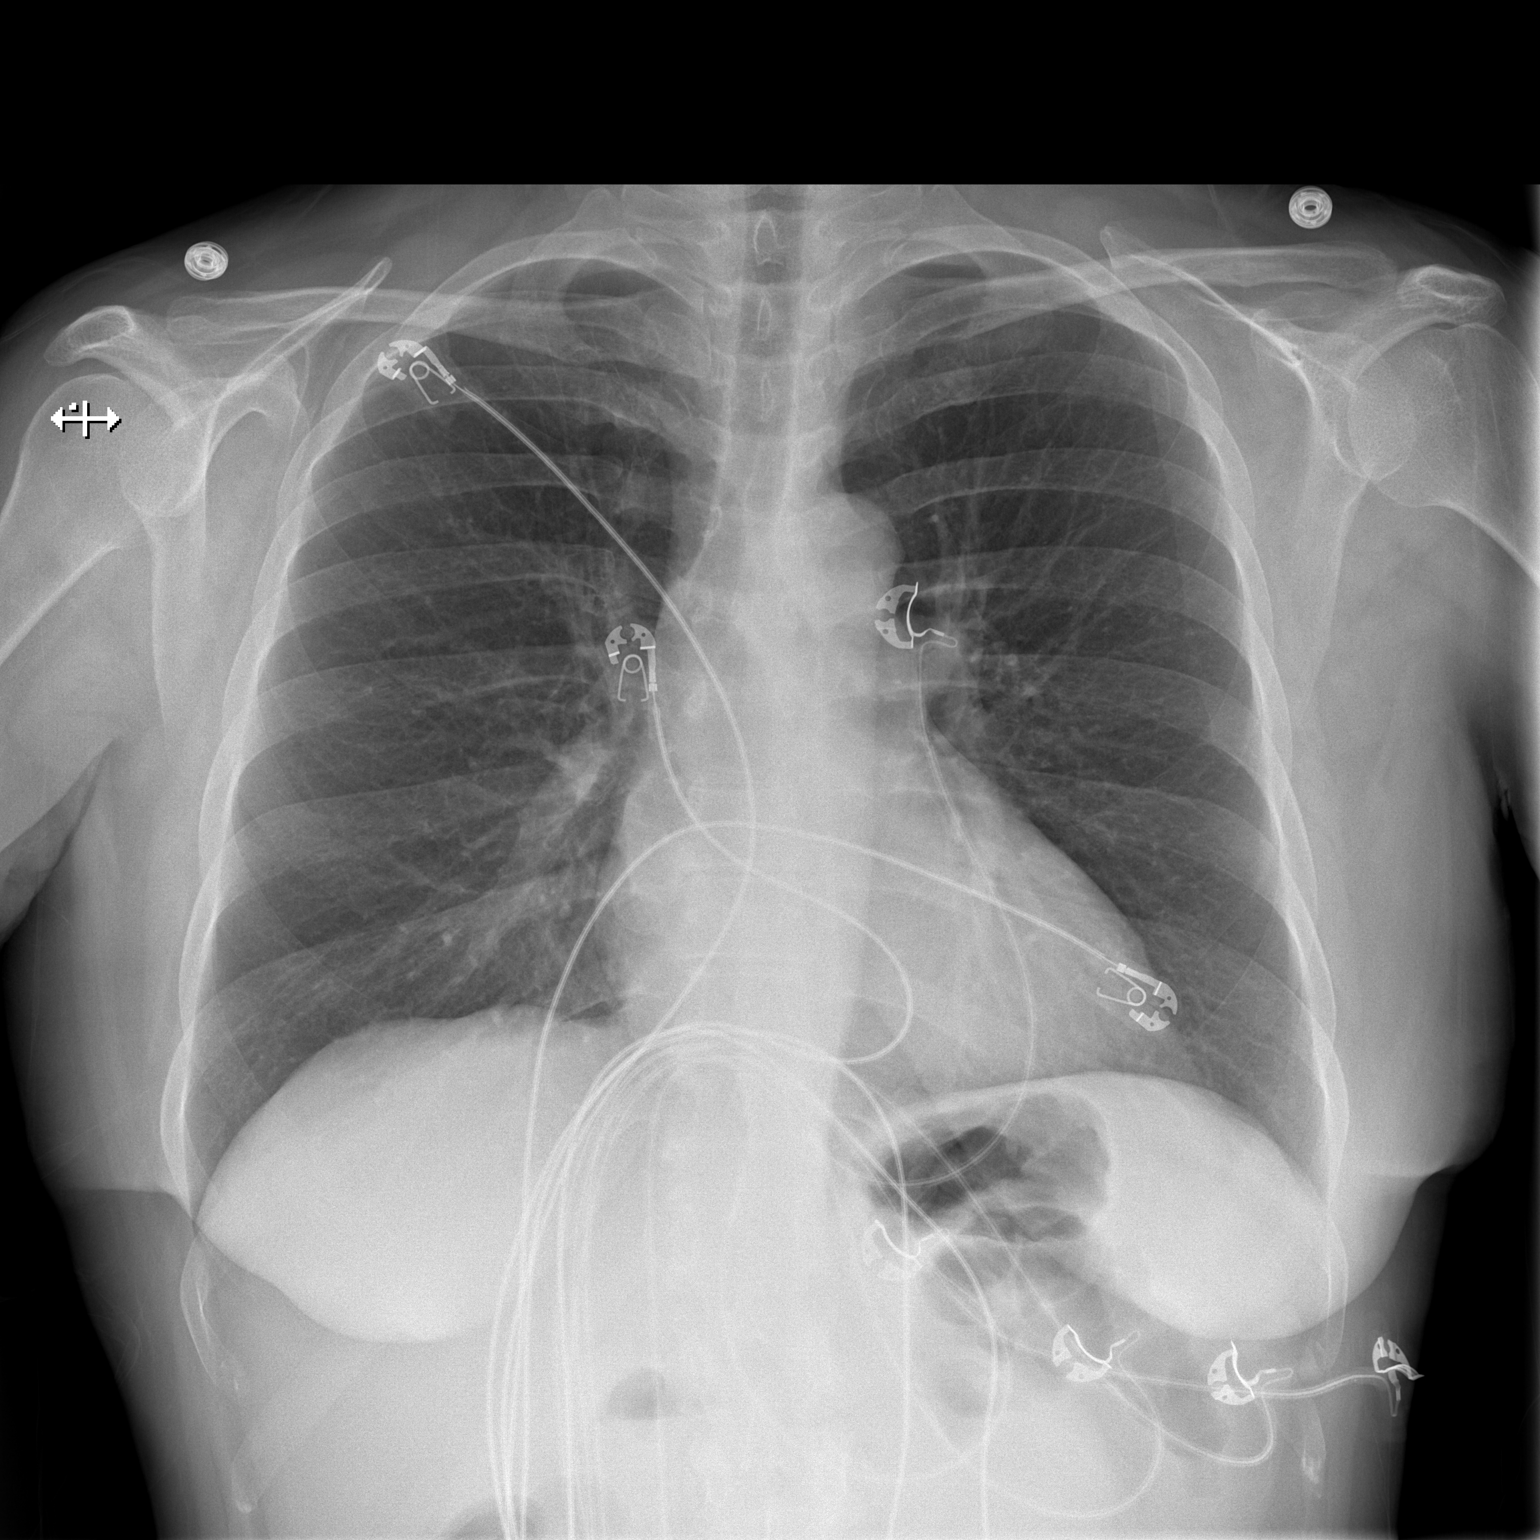

[1 of 1 positions shown; findings below may reference images not displayed]

FINDINGS: The lungs are well-expanded and clear. The heart and mediastinal
structures are normal. There is no pleural effusion or pneumothorax.
The bony thorax is unremarkable.
IMPRESSION: There is no acute cardiopulmonary abnormality.

## 2014-06-14 MED ORDER — ACETAMINOPHEN 325 MG PO TABS: 650.0000 mg | ORAL_TABLET | Freq: Four times a day (QID) | ORAL | Status: DC | PRN

## 2014-06-14 MED ORDER — LORAZEPAM 2 MG/ML IJ SOLN: 0.0000 mg | Freq: Two times a day (BID) | INTRAMUSCULAR | Status: DC

## 2014-06-14 MED ORDER — LORAZEPAM 2 MG/ML IJ SOLN: 0.0000 mg | Freq: Four times a day (QID) | INTRAMUSCULAR | Status: DC

## 2014-06-14 MED ORDER — LORAZEPAM 2 MG/ML IJ SOLN
0.0000 mg | Freq: Four times a day (QID) | INTRAMUSCULAR | Status: DC
  Administered 2014-06-15 (×2): 1 mg via INTRAVENOUS
  Administered 2014-06-16: 2 mg via INTRAVENOUS
  Filled 2014-06-14 (×3): qty 1

## 2014-06-14 MED ORDER — SODIUM CHLORIDE 0.9 % IJ SOLN
3.0000 mL | Freq: Two times a day (BID) | INTRAMUSCULAR | Status: DC
Start: 2014-06-14 — End: 2014-06-16
  Administered 2014-06-14: 3 mL via INTRAVENOUS

## 2014-06-14 MED ORDER — ONDANSETRON HCL 4 MG/2ML IJ SOLN
4.0000 mg | Freq: Four times a day (QID) | INTRAMUSCULAR | Status: DC | PRN
  Administered 2014-06-16: 4 mg via INTRAVENOUS
  Filled 2014-06-14: qty 2

## 2014-06-14 MED ORDER — SODIUM CHLORIDE 0.9 % IV BOLUS (SEPSIS)
500.0000 mL | Freq: Once | INTRAVENOUS | Status: AC
  Administered 2014-06-14: 500 mL via INTRAVENOUS

## 2014-06-14 MED ORDER — FOLIC ACID 1 MG PO TABS
1.0000 mg | ORAL_TABLET | Freq: Every day | ORAL | Status: DC
  Administered 2014-06-14 – 2014-06-16 (×3): 1 mg via ORAL
  Filled 2014-06-14 (×3): qty 1

## 2014-06-14 MED ORDER — SODIUM CHLORIDE 0.9 % IV SOLN
Freq: Once | INTRAVENOUS | Status: AC
  Administered 2014-06-14: 11:00:00 via INTRAVENOUS

## 2014-06-14 MED ORDER — ADULT MULTIVITAMIN W/MINERALS CH
1.0000 | ORAL_TABLET | Freq: Every day | ORAL | Status: DC
  Administered 2014-06-14 – 2014-06-16 (×3): 1 via ORAL
  Filled 2014-06-14 (×3): qty 1

## 2014-06-14 MED ORDER — LORAZEPAM 1 MG PO TABS: 0.0000 mg | ORAL_TABLET | Freq: Two times a day (BID) | ORAL | Status: DC

## 2014-06-14 MED ORDER — LORAZEPAM 2 MG/ML IJ SOLN
INTRAMUSCULAR | Status: AC
  Filled 2014-06-14: qty 1

## 2014-06-14 MED ORDER — SODIUM CHLORIDE 0.9 % IV SOLN
INTRAVENOUS | Status: DC
  Administered 2014-06-14: 08:00:00 via INTRAVENOUS

## 2014-06-14 MED ORDER — THIAMINE HCL 100 MG/ML IJ SOLN
100.0000 mg | Freq: Every day | INTRAMUSCULAR | Status: DC
Start: 2014-06-14 — End: 2014-06-14
  Administered 2014-06-14: 100 mg via INTRAVENOUS
  Filled 2014-06-14: qty 2

## 2014-06-14 MED ORDER — PAROXETINE HCL 20 MG PO TABS
20.0000 mg | ORAL_TABLET | Freq: Two times a day (BID) | ORAL | Status: DC
  Administered 2014-06-14 – 2014-06-16 (×4): 20 mg via ORAL
  Filled 2014-06-14 (×5): qty 1

## 2014-06-14 MED ORDER — VITAMIN B-1 100 MG PO TABS: 100.0000 mg | ORAL_TABLET | Freq: Every day | ORAL | Status: DC

## 2014-06-14 MED ORDER — IOHEXOL 350 MG/ML SOLN
100.0000 mL | Freq: Once | INTRAVENOUS | Status: AC | PRN
  Administered 2014-06-14: 100 mL via INTRAVENOUS

## 2014-06-14 MED ORDER — LORAZEPAM 2 MG/ML IJ SOLN
1.0000 mg | Freq: Once | INTRAMUSCULAR | Status: AC
  Administered 2014-06-14: 1 mg via INTRAVENOUS

## 2014-06-14 MED ORDER — LORAZEPAM 2 MG/ML IJ SOLN: 1.0000 mg | Freq: Four times a day (QID) | INTRAMUSCULAR | Status: DC | PRN

## 2014-06-14 MED ORDER — MAGNESIUM SULFATE 40 MG/ML IJ SOLN
2.0000 g | Freq: Once | INTRAMUSCULAR | Status: AC
  Filled 2014-06-14: qty 50

## 2014-06-14 MED ORDER — ENOXAPARIN SODIUM 40 MG/0.4ML ~~LOC~~ SOLN
40.0000 mg | SUBCUTANEOUS | Status: DC
  Administered 2014-06-14 – 2014-06-15 (×2): 40 mg via SUBCUTANEOUS
  Filled 2014-06-14 (×3): qty 0.4

## 2014-06-14 MED ORDER — MAGNESIUM SULFATE 40 MG/ML IJ SOLN
2.0000 g | Freq: Once | INTRAMUSCULAR | Status: AC
  Administered 2014-06-14: 2 g via INTRAVENOUS
  Filled 2014-06-14: qty 50

## 2014-06-14 MED ORDER — LORAZEPAM 1 MG PO TABS: 1.0000 mg | ORAL_TABLET | Freq: Four times a day (QID) | ORAL | Status: DC | PRN

## 2014-06-14 MED ORDER — ACETAMINOPHEN 650 MG RE SUPP: 650.0000 mg | Freq: Four times a day (QID) | RECTAL | Status: DC | PRN

## 2014-06-14 MED ORDER — VITAMIN B-1 100 MG PO TABS
100.0000 mg | ORAL_TABLET | Freq: Every day | ORAL | Status: DC
  Administered 2014-06-15: 100 mg via ORAL
  Filled 2014-06-14 (×2): qty 1

## 2014-06-14 MED ORDER — POTASSIUM CHLORIDE CRYS ER 20 MEQ PO TBCR
40.0000 meq | EXTENDED_RELEASE_TABLET | Freq: Once | ORAL | Status: AC
  Administered 2014-06-14: 40 meq via ORAL
  Filled 2014-06-14: qty 2

## 2014-06-14 MED ORDER — SODIUM CHLORIDE 0.9 % IV SOLN
INTRAVENOUS | Status: DC
  Administered 2014-06-14 – 2014-06-16 (×4): via INTRAVENOUS
  Filled 2014-06-14 (×7): qty 1000

## 2014-06-14 MED ORDER — ONDANSETRON HCL 4 MG PO TABS: 4.0000 mg | ORAL_TABLET | Freq: Four times a day (QID) | ORAL | Status: DC | PRN

## 2014-06-14 MED ORDER — LORAZEPAM 2 MG/ML IJ SOLN
1.0000 mg | INTRAMUSCULAR | Status: DC | PRN
  Administered 2014-06-14 – 2014-06-16 (×5): 1 mg via INTRAVENOUS
  Filled 2014-06-14 (×5): qty 1

## 2014-06-14 MED ORDER — SODIUM CHLORIDE 0.9 % IV BOLUS (SEPSIS)
1000.0000 mL | Freq: Once | INTRAVENOUS | Status: AC
  Administered 2014-06-14: 1000 mL via INTRAVENOUS

## 2014-06-14 MED ORDER — MAGNESIUM SULFATE 40 MG/ML IJ SOLN
2.0000 g | Freq: Once | INTRAMUSCULAR | Status: AC
  Administered 2014-06-14: 2 g via INTRAVENOUS
  Filled 2014-06-14 (×2): qty 50

## 2014-06-14 MED ORDER — MAGNESIUM CHLORIDE 64 MG PO TBEC
1.0000 | DELAYED_RELEASE_TABLET | Freq: Two times a day (BID) | ORAL | Status: DC
  Administered 2014-06-14 – 2014-06-16 (×4): 64 mg via ORAL
  Filled 2014-06-14 (×5): qty 1

## 2014-06-14 MED ORDER — POTASSIUM CHLORIDE 10 MEQ/100ML IV SOLN
10.0000 meq | INTRAVENOUS | Status: AC
  Administered 2014-06-14 (×4): 10 meq via INTRAVENOUS
  Filled 2014-06-14 (×4): qty 100

## 2014-06-14 MED ORDER — THIAMINE HCL 100 MG/ML IJ SOLN
100.0000 mg | Freq: Every day | INTRAMUSCULAR | Status: DC
  Administered 2014-06-16: 100 mg via INTRAVENOUS
  Filled 2014-06-14 (×2): qty 1

## 2014-06-14 MED ORDER — LORAZEPAM 1 MG PO TABS
0.0000 mg | ORAL_TABLET | Freq: Four times a day (QID) | ORAL | Status: DC
  Administered 2014-06-14: 2 mg via ORAL
  Administered 2014-06-14: 4 mg via ORAL
  Filled 2014-06-14: qty 2
  Filled 2014-06-14: qty 4

## 2014-06-14 NOTE — ED Notes (Signed)
Report given to Esther, RN.

## 2014-06-14 NOTE — ED Notes (Signed)
Pt given a cup of ice water

## 2014-06-14 NOTE — ED Notes (Signed)
Pt back from CT  Pt alert and oriented x4. Respirations even and unlabored, bilateral symmetrical rise and fall of chest. Skin warm and dry. In no acute distress. Denies needs.

## 2014-06-14 NOTE — ED Notes (Signed)
Pt to CT

## 2014-06-14 NOTE — H&P (Signed)
History and Physical  Dalayna [place] XAJ:[id number] DOB: 1966-06-02 DOA: 06/14/2014   PCP: Stephens Shire, MD   Chief Complaint: Leg weakness and numbness  HPI:  48 year old female with a history of hypertension, anxiety, and alcohol dependence presents with two-day history of bilateral lower extremity weakness and numbness. She also describes a burning sensation in her bilateral feet during this period of time. The patient initially attributed this to panic attacks and anxiety due to her recent trip to the mountains of [place], but when her symptoms persisted, the patient was brought to the emergency department by her fianc. Her symptoms progressed to the point where she has difficulty getting up to walk this morning. The patient denied any headache, visual loss, dysarthria, focal upper extremity weakness, back pain, dysuria, bowel or bladder incontinence. She presently denies any chest discomfort but complains of some dyspnea. She denies any fevers, chills, coughing, hemoptysis, vomiting, abdominal pain, dysuria, hematochezia, melena. She has some nausea. The patient states that she drinks one glass of wine 3 times per week. However the patient's mother stated that the patient drinks on a daily basis although it is unclear how much she drinks. According to the patient, her last drink was 8 days ago. The patient states that she used to drink more heavily, and has been drinking approximately 20 years. She denies any illegal drug use. The patient smokes 3/4 ppd x10 years.  In the emergency department, point of care electrolytes revealed potassium 2.5, magnesium 0.6. CBC was 11.0 with hemoglobin 16.4 and platelets 111,000. The patient was given 2 g of magnesium sulfate IV, 40 mEq of potassium chloride po, 40 mEq of potassium chloride IV. EKG shows sinus rhythm with nonspecific T-wave changes. Chest x-ray was negative for infiltrates or pulmonary edema. CT angiogram of the chest was negative for  pulmonary embolus. Assessment/Plan: Sensory disturbance/lower extremity weakness -Primarily attributable to the patient's hypokalemia and hypomagnesemia -Symptomatically improving but not resolved after some replacement in ED -Cannot rule out degree of alcohol peripheral neuropathy -CT brain without contrast -Urine drug screen -Serum B12, RBC folate, HIV, RPR, TSH Alcohol withdrawal -This is likely the cause of the patient's tremulousness -Mother revealed that pt has been drinking daily -pt only endorsed drinking 1 glass wine 3X/week -Alcohol withdrawal protocol Hypokalemia/hypomagnesemia -Secondary to poor oral intake -Replete Thrombocytopenia -Likely due to chronic alcohol use -Serum B12, RBC folate, HIV Anxiety -Continue Paxil Hypertension -Hold atenolol as the patient's blood pressure is soft -IV fluids Acrocyanosis of toes -DP and PT pulses palapable -improved with socks Dyspnea -Suspect anxiety and alcohol withdrawal -CT angiogram chest negative for pulmonary embolus -Stable on room air -EKG with nonspecific T-wave changes -Cycle troponins      Past Medical History  Diagnosis Date  . Anxiety state, unspecified   . Essential hypertension, benign   . Depression   . History of pneumonia    Past Surgical History  Procedure Laterality Date  . Eye surgery      left  . Breast lumpectomy     Social History:  reports that she has been smoking Cigarettes.  She has a 20 pack-year smoking history. She has never used smokeless tobacco. She reports that she drinks about 4.2 ounces of alcohol per week. She reports that she does not use illicit drugs.   Family History  Problem Relation Age of Onset  . Breast cancer Paternal Grandmother   . Breast cancer Mother   . Diabetes Mother     pre-diabetes  .  Ovarian cancer Maternal Grandmother   . Prostate cancer Father   . Hypertension Father   . Stomach cancer Cousin   . Colon cancer Paternal Grandfather   . Stroke  Maternal Grandfather      Allergies  Allergen Reactions  . Codeine Hives  . Sulfa Antibiotics Hives      Prior to Admission medications   Medication Sig Start Date End Date Taking? Authorizing Provider  atenolol (TENORMIN) 50 MG tablet Take 100 mg by mouth daily.    Yes Historical Provider, MD  Cyanocobalamin (VITAMIN B-12 PO) Take 1 tablet by mouth daily.   Yes Historical Provider, MD  PARoxetine (PAXIL) 20 MG tablet Take 20 mg by mouth 2 (two) times daily.   Yes Historical Provider, MD  VITAMIN D, ERGOCALCIFEROL, PO Take 1 tablet by mouth daily.   Yes Historical Provider, MD    Review of Systems:  Constitutional:  No weight loss, night sweats, Fevers,  fatigue.  Head&Eyes: No headache.  No vision loss.  No eye pain or scotoma ENT:  No Difficulty swallowing,Tooth/dental problems,Sore throat,  No ear ache, post nasal drip,  Cardio-vascular:  No chest pain, Orthopnea, PND, swelling in lower extremities,  dizziness, palpitations  GI:  No  abdominal pain,  vomiting, diarrhea, loss of appetite, hematochezia, melena, heartburn, indigestion, Resp:  . No cough. No coughing up of blood .No wheezing.No chest wall deformity  Skin:  no rash or lesions.  GU:  no dysuria, change in color of urine, no urgency or frequency. No flank pain.  Musculoskeletal:  No joint pain or swelling. No decreased range of motion. No back pain.  Psych:  No change in mood or affect.  Neurologic: No headache, no dysesthesia, no focal weakness, no vision loss. No syncope  Physical Exam: Filed Vitals:   06/14/14 0915 06/14/14 0925 06/14/14 0930 06/14/14 0940  BP: 112/68  118/71   Pulse: 69 70 74 70  Temp:  98.7 F (37.1 C)    TempSrc:  Oral    Resp: 16 18  17   Height:      Weight:      SpO2: 96%   100%   General:  A&O x 3, NAD, pleasant/cooperative, appears tremulous and anxious Head/Eye: No conjunctival hemorrhage, no icterus, Placer/AT, No nystagmus ENT:  No icterus,  No thrush, good dentition,  no pharyngeal exudate Neck:  No masses, no lymphadenpathy, no bruits CV:  RRR, no rub, no gallop, no S3 Lung:  CTAB, good air movement, no wheeze, no rhonchi Abdomen: soft/NT, +BS, nondistended, no peritoneal signs Ext: No cyanosis, No rashes, No petechiae, No lymphangitis, No edema Neuro: CNII-XII intact, strength 4/5 in bilateral upper and lower extremities, no dysmetria  Labs on Admission:  Basic Metabolic Panel:  Recent Labs Lab 06/14/14 0541 06/14/14 0551  NA  --  135*  K 2.7* 2.5*  CL  --  91*  GLUCOSE  --  96  BUN  --  19  CREATININE  --  0.90  MG 0.6*  --    Liver Function Tests: No results found for this basename: AST, ALT, ALKPHOS, BILITOT, PROT, ALBUMIN,  in the last 168 hours No results found for this basename: LIPASE, AMYLASE,  in the last 168 hours No results found for this basename: AMMONIA,  in the last 168 hours CBC:  Recent Labs Lab 06/14/14 0541 06/14/14 0551  WBC 7.0  --   NEUTROABS 4.5  --   HGB 16.4* 18.4*  HCT 46.9* 54.0*  MCV 106.1*  --  PLT 111*  --    Cardiac Enzymes: No results found for this basename: CKTOTAL, CKMB, CKMBINDEX, TROPONINI,  in the last 168 hours BNP: No components found with this basename: POCBNP,  CBG: No results found for this basename: GLUCAP,  in the last 168 hours  Radiological Exams on Admission: Dg Chest 1 View  06/14/2014   CLINICAL DATA:  Shortness of breath, PAC intact, history of tobacco use  EXAM: CHEST - 1 VIEW  COMPARISON:  None.  FINDINGS: The lungs are well-expanded and clear. The heart and mediastinal structures are normal. There is no pleural effusion or pneumothorax. The bony thorax is unremarkable.  IMPRESSION: There is no acute cardiopulmonary abnormality.   Electronically Signed   By: Frankey Botting  [place]   On: 06/14/2014 08:46   Ct Angio Chest Pe W/cm &/or Wo Cm  06/14/2014   CLINICAL DATA:  Shortness of breath, tachycardia  EXAM: CT ANGIOGRAPHY CHEST WITH CONTRAST  TECHNIQUE: Multidetector CT imaging of  the chest was performed using the standard protocol during bolus administration of intravenous contrast. Multiplanar CT image reconstructions and MIPs were obtained to evaluate the vascular anatomy.  CONTRAST:  139mL OMNIPAQUE IOHEXOL 350 MG/ML SOLN  COMPARISON:  Chest radiograph dated 06/14/2014  FINDINGS: No evidence of pulmonary embolism.  Lungs are essentially clear. Minimal dependent atelectasis. No suspicious pulmonary nodules. No pleural effusion or pneumothorax.  The heart is top-normal in size. No pericardial effusion. Minimal Coronary atherosclerosis in the LAD.  No suspicious mediastinal, hilar, or axillary lymphadenopathy.  Visualized upper abdomen is unremarkable.  Mild degenerative changes of the mid/lower thoracic spine.  Review of the MIP images confirms the above findings.  IMPRESSION: No evidence of pulmonary embolism.  No evidence of acute cardiopulmonary disease.   Electronically Signed   By: Julian Hy M.D.   On: 06/14/2014 10:04    EKG: Independently reviewed. Sinus rhythm with nonspecific T-wave change    Time spent:70 minutes Code Status:   FULL Family Communication:   Mother at bedside   Jerimie Mancuso, DO  [facility] Pager [phone]  If 7PM-7AM, please contact night-coverage [email or web address] Password [facility] 06/14/2014, 11:46 AM

## 2014-06-14 NOTE — ED Notes (Signed)
Mother came out and spoke with rn stating that according to pts fiance pt drinks heavily on a daily basis. rn asked pt about ETOH use, she states she only drinks 3-4 glasses of wine per week. Pt reports that 2 years ago pt drank heavily after her father died, but stopped drinking heavily in Feb 2014. Pt reports as a child she had mild tremors.

## 2014-06-14 NOTE — Progress Notes (Signed)
UR completed 

## 2014-06-14 NOTE — ED Provider Notes (Signed)
CSN: [id number]     Arrival date & time 06/14/14  0426 History   First MD Initiated Contact with Patient 06/14/14 [phone]     Chief Complaint  Patient presents with  . Panic Attack   (Consider location/radiation/quality/duration/timing/severity/associated sxs/prior Treatment) HPI Tara [name] is a 48 yo with PMH: presenting to the ED with c/o feeling anxious x 5 days.  Reports was riding in the car and had episode of feeling short of breath, and very nervous.  The next day she felt more tired and would feel very hot and then diaphoretic. That night she felt like her skin was on her legs was burning and her arms and legs would jump involuntarily. This has progressively gotten worse until today when the involuntary muscle jerks became so bothersome she could not sleep and was painful.  She is currently pain free but still has the muscle jerking.  She has had one episode of vomiting 5 days ago, but reports irregular bowel habits, with days of normal bowel movements then several days of diarrhea, with sometimes 6-8x/day.  The last day of diarrhea was last week.  She denies fever, abd pain, and no melena or hematachezia.   Past Medical History  Diagnosis Date  . Anxiety state, unspecified   . Essential hypertension, benign   . Depression   . History of pneumonia    Past Surgical History  Procedure Laterality Date  . Eye surgery      left  . Breast lumpectomy     Family History  Problem Relation Age of Onset  . Breast cancer Paternal Grandmother   . Breast cancer Mother   . Diabetes Mother     pre-diabetes  . Ovarian cancer Maternal Grandmother   . Prostate cancer Father   . Hypertension Father   . Stomach cancer Cousin   . Colon cancer Paternal Grandfather   . Stroke Maternal Grandfather    History  Substance Use Topics  . Smoking status: Current Every Day Smoker -- 1.00 packs/day for 20 years    Types: Cigarettes  . Smokeless tobacco: Never Used  . Alcohol Use: 4.2 oz/week    7  Glasses of wine per week     Comment: 1 per day   OB History   Grav Para Term Preterm Abortions TAB SAB Ect Mult Living   1    1 1     0     Review of Systems  Constitutional: Negative for fever and chills.  HENT: Negative for sore throat.   Eyes: Negative for visual disturbance.  Respiratory: Negative for cough and shortness of breath.   Cardiovascular: Negative for chest pain and leg swelling.  Gastrointestinal: Positive for vomiting and diarrhea. Negative for nausea.  Genitourinary: Negative for dysuria.  Musculoskeletal: Positive for myalgias.  Skin: Negative for rash.  Neurological: Positive for tremors. Negative for weakness, numbness and headaches.  Psychiatric/Behavioral: The patient is nervous/anxious.     Allergies  Codeine and Sulfa antibiotics  Home Medications   Prior to Admission medications   Medication Sig Start Date End Date Taking? Authorizing Provider  atenolol (TENORMIN) 50 MG tablet Take 100 mg by mouth daily.    Yes Historical Provider, MD  Cyanocobalamin (VITAMIN B-12 PO) Take 1 tablet by mouth daily.   Yes Historical Provider, MD  PARoxetine (PAXIL) 20 MG tablet Take 20 mg by mouth 2 (two) times daily.   Yes Historical Provider, MD  VITAMIN D, ERGOCALCIFEROL, PO Take 1 tablet by mouth daily.   Yes  Historical Provider, MD   BP 151/135  Pulse 85  Temp(Src) 97.5 F (36.4 C) (Oral)  Resp 18  Ht 5\' 5"  (1.651 m)  Wt 175 lb (79.379 kg)  BMI 29.12 kg/m2  SpO2 100%  LMP 08/26/2011 Physical Exam  Nursing note and vitals reviewed. Constitutional: She is oriented to person, place, and time. She appears well-developed and well-nourished. No distress.  HENT:  Head: Normocephalic and atraumatic.  Mouth/Throat: Oropharynx is clear and moist. No oropharyngeal exudate.  Eyes: Conjunctivae are normal.  Neck: Neck supple. No thyromegaly present.  Cardiovascular: Normal rate, regular rhythm and intact distal pulses.   Pulmonary/Chest: Effort normal and breath  sounds normal. No respiratory distress. She has no wheezes. She has no rales. She exhibits no tenderness.  Abdominal: Soft. Bowel sounds are normal. There is no tenderness.  Musculoskeletal: She exhibits no tenderness.  Lymphadenopathy:    She has no cervical adenopathy.  Neurological: She is alert and oriented to person, place, and time. She displays tremor. No cranial nerve deficit or sensory deficit. She exhibits normal muscle tone. GCS eye subscore is 4. GCS verbal subscore is 5. GCS motor subscore is 6.  Reflex Scores:      Patellar reflexes are 3+ on the right side and 3+ on the left side. Pt has 5/5 strength in upper extremities, despite muscle tremors.  Pt has 5/5 strength with straight leg raise but having difficulty with dorsi/plantar flexion.  Skin: Skin is warm and dry. No rash noted. She is not diaphoretic.  Psychiatric: Her mood appears anxious.    ED Course  Procedures (including critical care time) Labs Review Labs Reviewed  CBC WITH DIFFERENTIAL - Abnormal; Notable for the following:    Hemoglobin 16.4 (*)    HCT 46.9 (*)    MCV 106.1 (*)    MCH 37.1 (*)    Platelets 111 (*)    All other components within normal limits  POTASSIUM - Abnormal; Notable for the following:    Potassium 2.7 (*)    All other components within normal limits  MAGNESIUM - Abnormal; Notable for the following:    Magnesium 0.6 (*)    All other components within normal limits  I-STAT CHEM 8, ED - Abnormal; Notable for the following:    Sodium 135 (*)    Potassium 2.5 (*)    Chloride 91 (*)    Calcium, Ion 0.98 (*)    Hemoglobin 18.4 (*)    HCT 54.0 (*)    All other components within normal limits  ETHANOL  URINALYSIS, ROUTINE W REFLEX MICROSCOPIC  URINE RAPID DRUG SCREEN (HOSP PERFORMED)    Imaging Review CT Angio Chest PE W/Cm &/Or Wo Cm (Final result)  Result time: 06/14/14 10:04:04    Final result by Rad Results In Interface (06/14/14 10:04:04)    Narrative:   CLINICAL DATA:  Shortness of breath, tachycardia  EXAM: CT ANGIOGRAPHY CHEST WITH CONTRAST  TECHNIQUE: Multidetector CT imaging of the chest was performed using the standard protocol during bolus administration of intravenous contrast. Multiplanar CT image reconstructions and MIPs were obtained to evaluate the vascular anatomy.  CONTRAST: 162mL OMNIPAQUE IOHEXOL 350 MG/ML SOLN  COMPARISON: Chest radiograph dated 06/14/2014  FINDINGS: No evidence of pulmonary embolism.  Lungs are essentially clear. Minimal dependent atelectasis. No suspicious pulmonary nodules. No pleural effusion or pneumothorax.  The heart is top-normal in size. No pericardial effusion. Minimal Coronary atherosclerosis in the LAD.  No suspicious mediastinal, hilar, or axillary lymphadenopathy.  Visualized upper abdomen is  unremarkable.  Mild degenerative changes of the mid/lower thoracic spine.  Review of the MIP images confirms the above findings.  IMPRESSION: No evidence of pulmonary embolism.  No evidence of acute cardiopulmonary disease.   Electronically Signed By: Julian Hy M.D. On: 06/14/2014 10:04             DG Chest 1 View (Final result)  Result time: 06/14/14 08:46:51    Final result by Rad Results In Interface (06/14/14 08:46:51)    Narrative:   CLINICAL DATA: Shortness of breath, PAC intact, history of tobacco use  EXAM: CHEST - 1 VIEW  COMPARISON: None.  FINDINGS: The lungs are well-expanded and clear. The heart and mediastinal structures are normal. There is no pleural effusion or pneumothorax. The bony thorax is unremarkable.  IMPRESSION: There is no acute cardiopulmonary abnormality.      EKG Interpretation   Date/Time:  Thursday June 14 2014 08:59:30 EDT Ventricular Rate:  69 PR Interval:  150 QRS Duration: 105 QT Interval:  474 QTC Calculation: 508 R Axis:   -26 Text Interpretation:  Sinus rhythm Probable left atrial enlargement RSR'  in V1 or V2,  probably normal variant Left ventricular hypertrophy Anterior  Q waves, possibly due to LVH Borderline T abnormalities, inferior leads  Prolonged QT Since last tracing of earlier today no change Confirmed by  [employer]  MD, ELLIOTT ([id number]) on 06/14/2014 12:58:08 PM      MDM   Final diagnoses:  Hypokalemia  Hypomagnesemia  Prolonged Q-T interval on ECG  Anxiety  Tobacco abuse  Alcohol withdrawal, with unspecified complication   48 yo female with tremors, pt denies alcoholism, but mother reports frequent alcohol use.   CBC: shows elevated hgb: possibly hemoconcentration from dehydration, Chem 8: hypokalemia, CXR, ETOH, and UA without significant abnormality. EKG: prolonged QT wave  IV fluid bolus, PO and IV potassium, IV magnesium.  7:51 AM Called to bedside, pt reportedly having panic attack.  Pt laying at bottom stretcher, tachypneic, diaphoretic, tachycardic on monitor, hypertensive, responsive.  Pt repositioned in bed and heart rate decreased from 140s to 70s to 54-58 bpm.  Pt remains alert, respirations slowing and normalizing.  Ativan 1 mg ordered.  CTA chest: neg for PE.  CIWA protocol initiated.  On re-exam, pt reports feeling less tremors and has not had a recurrence of previous episode, continues to be alert and oriented, without distress. Pt will be admitted to hospitalist.  Case discussed with Dr. Eulis Foster.   Filed Vitals:    06/14/14 0615  06/14/14 0750  06/14/14 0925  06/14/14 1200   BP:  175/82  210/150  112/68  131/84   Pulse:  69  140  68  70   Temp:  97.5   98.7  97.2   TempSrc:  oral   oral  oral   Resp:  19  36  20  14   Height:       Weight:       SpO2:  98%  99%  99%  100%    Meds given in ED:  Medications  potassium chloride 10 mEq in 100 mL IVPB (10 mEq Intravenous New Bag/Given 06/14/14 1030)  0.9 %  sodium chloride infusion ( Intravenous New Bag/Given 06/14/14 0755)  LORazepam (ATIVAN) 2 MG/ML injection (  Not Given 06/14/14 1054)  LORazepam (ATIVAN) tablet  0-4 mg (0 mg Oral Not Given 06/14/14 0815)  LORazepam (ATIVAN) tablet 0-4 mg (not administered)  thiamine (VITAMIN B-1) tablet 100 mg (100 mg Oral Not Given  06/14/14 1033)  thiamine (B-1) injection 100 mg (100 mg Intravenous Given 06/14/14 1029)  magnesium sulfate IVPB 2 g 50 mL (not administered)  magnesium sulfate IVPB 2 g 50 mL (0 g Intravenous Stopped 06/14/14 1023)  potassium chloride SA (K-DUR,KLOR-CON) CR tablet 40 mEq (40 mEq Oral Given 06/14/14 0754)  sodium chloride 0.9 % bolus 500 mL (0 mLs Intravenous Stopped 06/14/14 1023)  iohexol (OMNIPAQUE) 350 MG/ML injection 100 mL (100 mLs Intravenous Contrast Given 06/14/14 0949)  LORazepam (ATIVAN) injection 1 mg (1 mg Intravenous Given 06/14/14 0759)  sodium chloride 0.9 % bolus 1,000 mL (1,000 mLs Intravenous New Bag/Given 06/14/14 1128)  0.9 %  sodium chloride infusion ( Intravenous New Bag/Given 06/14/14 1128)    New Prescriptions   No medications on file      Britt Bottom, NP 06/17/14 2228

## 2014-06-14 NOTE — ED Notes (Signed)
Pt states that she hasn't slept in weeks, just an hour here and there

## 2014-06-14 NOTE — ED Notes (Signed)
Upon pt going up to the floor. 4th run of potassium is infusing, 2nd run of magnesium is infusing. Pt was given 1mg  ativan at 0800 and 2mg  ativan at 1200 based on CWA scale of 12. Pt has had 1593ml NS infused. Now has 2 L hung and infusing with medications.

## 2014-06-14 NOTE — Progress Notes (Signed)
[place],   Provided pt with a [employer] application to help patient establish primary care. Also, provided pt with information on [facility] [place].

## 2014-06-14 NOTE — ED Provider Notes (Signed)
Face-to-face evaluation   History: Presents for evaluation of sensation of numbness in his feet, legs, and shaking for several days. She has occasional diarrhea. She denies alcoholism. She smokes cigarettes.    Physical exam: Alert, anxious, tremulous. Toes are cyanotic bilaterally. She does not cyanotic. Heart tachycardic, no murmur. Lungs - decreased air movement bilaterally without rhonchi, scattered wheezes.  07:45- Pt with sudden "panic attack, 1/2 off stretcher, and diaphoretic. HR 140, rapidly dropped to sinus brady at 56, BP elevated, 210/150. Pt alert and lucid. Ordered Ativan, CXR, CTA Chest Chest, and repeat EKG.  10:35- Toes not cyanotic now. Normal palpable pulses in both DP. Pt more calm now.  10:20 AM-Consult complete with Dr. Carles Collet. Patient case explained and discussed. He agrees to admit patient for further evaluation and treatment. Call ended at [place] Performed by: Richarda Blade Total critical care time: 40 minutes Critical care time was exclusive of separately billable procedures and treating other patients. Critical care was necessary to treat or prevent imminent or life-threatening deterioration. Critical care was time spent personally by me on the following activities: development of treatment plan with patient and/or surrogate as well as nursing, discussions with consultants, evaluation of patient's response to treatment, examination of patient, obtaining history from patient or surrogate, ordering and performing treatments and interventions, ordering and review of laboratory studies, ordering and review of radiographic studies, pulse oximetry and re-evaluation of patient's condition.  Results for orders placed during the hospital encounter of 06/14/14  CBC WITH DIFFERENTIAL      Result Value Ref Range   WBC 7.0  4.0 - 10.5 K/uL   RBC 4.42  3.87 - 5.11 MIL/uL   Hemoglobin 16.4 (*) 12.0 - 15.0 g/dL   HCT 46.9 (*) 36.0 - 46.0 %   MCV 106.1 (*) 78.0  - 100.0 fL   MCH 37.1 (*) 26.0 - 34.0 pg   MCHC 35.0  30.0 - 36.0 g/dL   RDW 14.7  11.5 - 15.5 %   Platelets 111 (*) 150 - 400 K/uL   Neutrophils Relative % 64  43 - 77 %   Neutro Abs 4.5  1.7 - 7.7 K/uL   Lymphocytes Relative 28  12 - 46 %   Lymphs Abs 1.9  0.7 - 4.0 K/uL   Monocytes Relative 7  3 - 12 %   Monocytes Absolute 0.5  0.1 - 1.0 K/uL   Eosinophils Relative 1  0 - 5 %   Eosinophils Absolute 0.0  0.0 - 0.7 K/uL   Basophils Relative 1  0 - 1 %   Basophils Absolute 0.0  0.0 - 0.1 K/uL  ETHANOL      Result Value Ref Range   Alcohol, Ethyl (B) <11  0 - 11 mg/dL  POTASSIUM      Result Value Ref Range   Potassium 2.7 (*) 3.7 - 5.3 mEq/L  MAGNESIUM      Result Value Ref Range   Magnesium 0.6 (*) 1.5 - 2.5 mg/dL  I-STAT CHEM 8, ED      Result Value Ref Range   Sodium 135 (*) 137 - 147 mEq/L   Potassium 2.5 (*) 3.7 - 5.3 mEq/L   Chloride 91 (*) 96 - 112 mEq/L   BUN 19  6 - 23 mg/dL   Creatinine, Ser 0.90  0.50 - 1.10 mg/dL   Glucose, Bld 96  70 - 99 mg/dL   Calcium, Ion 0.98 (*) 1.12 - 1.23 mmol/L   TCO2 32  0 - 100 mmol/L   Hemoglobin 18.4 (*) 12.0 - 15.0 g/dL   HCT 54.0 (*) 36.0 - 46.0 %   Comment NOTIFIED PHYSICIAN      EKG Interpretation  Date/Time:  Thursday June 14 2014 06:16:10 EDT Ventricular Rate:  58 PR Interval:  140 QRS Duration: 102 QT Interval:  509 QTC Calculation: 500 R Axis:   -33 Text Interpretation:  Sinus rhythm RSR' in V1 or V2, probably normal variant Left ventricular hypertrophy Borderline T abnormalities, inferior leads Prolonged QT No old tracing to compare Reconfirmed by [facility]  MD, Kenton Fortin [phone]) on 06/14/2014 7:28:08 AM         ICD-9-CM ICD-10-CM  1. Hypokalemia 276.8 E87.6  2. Hypomagnesemia 275.2 E83.42  3. Prolonged Q-T interval on ECG 794.31 I45.81  4. Anxiety 300.00 F41.9  5. Tobacco abuse 305.1 Z72.0  6. Alcohol withdrawal, with unspecified complication 233.61 Q24.497   Nursing Notes Reviewed/ Care  Coordinated Applicable Imaging Reviewed Interpretation of Laboratory Data incorporated into ED treatment  Assessment: Apparent alcohol withdrawal syndrome operative by electrolyte abnormalities, diarrhea, EKG abnormality, and disease denial. She'll need to be admitted for management.  Medical screening examination/treatment/procedure(s) were conducted as a shared visit with non-physician practitioner(s) and myself.  I personally evaluated the patient during the encounter  Richarda Blade, MD 06/14/14 1104

## 2014-06-14 NOTE — ED Notes (Signed)
Pt being assisted to bathroom.

## 2014-06-14 NOTE — ED Notes (Signed)
Pt states that this panic attack started on Tuesday and now her legs are numb and she's shaking all over, nothing specific happened to provoke this attack

## 2014-06-15 ENCOUNTER — Inpatient Hospital Stay (HOSPITAL_COMMUNITY): Payer: Self-pay

## 2014-06-15 LAB — CBC
HCT: 36.3 % (ref 36.0–46.0)
Hemoglobin: 12.6 g/dL (ref 12.0–15.0)
MCH: 36.5 pg — ABNORMAL HIGH (ref 26.0–34.0)
MCHC: 34.7 g/dL (ref 30.0–36.0)
MCV: 105.2 fL — ABNORMAL HIGH (ref 78.0–100.0)
PLATELETS: 93 10*3/uL — AB (ref 150–400)
RBC: 3.45 MIL/uL — AB (ref 3.87–5.11)
RDW: 14.5 % (ref 11.5–15.5)
WBC: 4.2 10*3/uL (ref 4.0–10.5)

## 2014-06-15 LAB — COMPREHENSIVE METABOLIC PANEL
ALT: 12 U/L (ref 0–35)
ANION GAP: 12 (ref 5–15)
AST: 22 U/L (ref 0–37)
Albumin: 3.2 g/dL — ABNORMAL LOW (ref 3.5–5.2)
Alkaline Phosphatase: 74 U/L (ref 39–117)
BUN: 18 mg/dL (ref 6–23)
CALCIUM: 8 mg/dL — AB (ref 8.4–10.5)
CO2: 26 meq/L (ref 19–32)
CREATININE: 0.87 mg/dL (ref 0.50–1.10)
Chloride: 102 mEq/L (ref 96–112)
GFR, EST NON AFRICAN AMERICAN: 79 mL/min — AB (ref >90)
GLUCOSE: 84 mg/dL (ref 70–99)
Potassium: 3.4 mEq/L — ABNORMAL LOW (ref 3.7–5.3)
Sodium: 140 mEq/L (ref 137–147)
TOTAL PROTEIN: 5.7 g/dL — AB (ref 6.0–8.3)
Total Bilirubin: 1.1 mg/dL (ref 0.3–1.2)

## 2014-06-15 LAB — RAPID URINE DRUG SCREEN, HOSP PERFORMED
Amphetamines: NOT DETECTED
BENZODIAZEPINES: NOT DETECTED
Barbiturates: NOT DETECTED
Cocaine: NOT DETECTED
OPIATES: NOT DETECTED
TETRAHYDROCANNABINOL: NOT DETECTED

## 2014-06-15 LAB — VITAMIN B12: Vitamin B-12: 929 pg/mL — ABNORMAL HIGH (ref 211–911)

## 2014-06-15 LAB — TROPONIN I

## 2014-06-15 LAB — GLUCOSE, CAPILLARY: GLUCOSE-CAPILLARY: 83 mg/dL (ref 70–99)

## 2014-06-15 LAB — HIV ANTIBODY (ROUTINE TESTING W REFLEX): HIV: NONREACTIVE

## 2014-06-15 LAB — MAGNESIUM: MAGNESIUM: 2 mg/dL (ref 1.5–2.5)

## 2014-06-15 IMAGING — CT CT HEAD W/O CM
1 of 2 series · 16 of 30 positions shown, 20 images · non-contrast
Comparison: None.

CLINICAL DATA: Alcohol withdrawal, sensory disturbance

EXAM:
CT HEAD WITHOUT CONTRAST
TECHNIQUE: Contiguous axial images were obtained from the base of the skull
through the vertex without intravenous contrast.

[Series 3: headseq 4.8 h45s · axial · 0.50mm/px · z∈[-134,+18]mm · 16 of 36 slices shown, 20 images]
[im 2/36  brain]
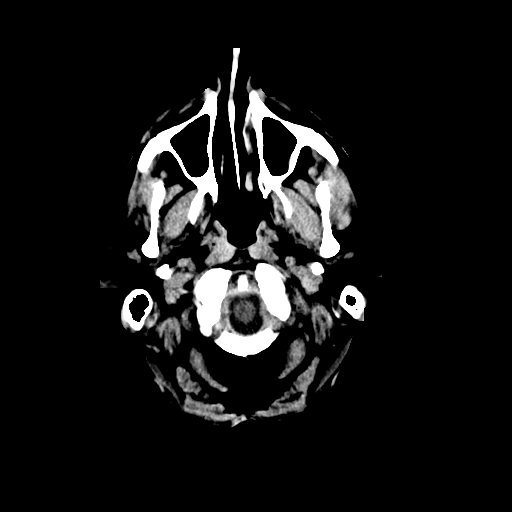
[im 2/36  bone]
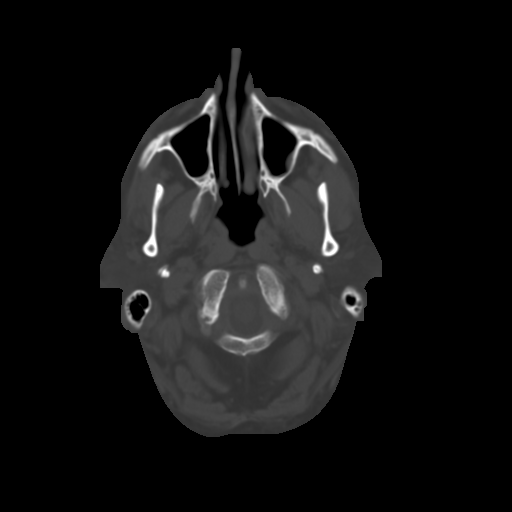
[im 4/36  brain]
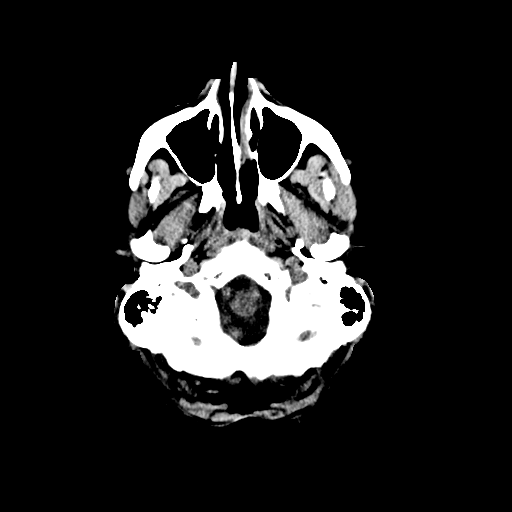
[im 7/36  brain]
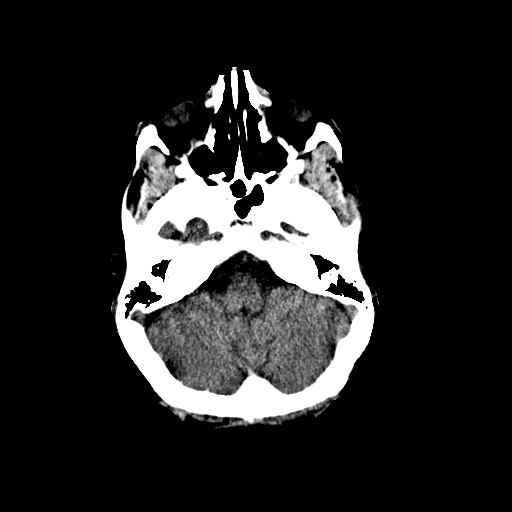
[im 9/36  brain]
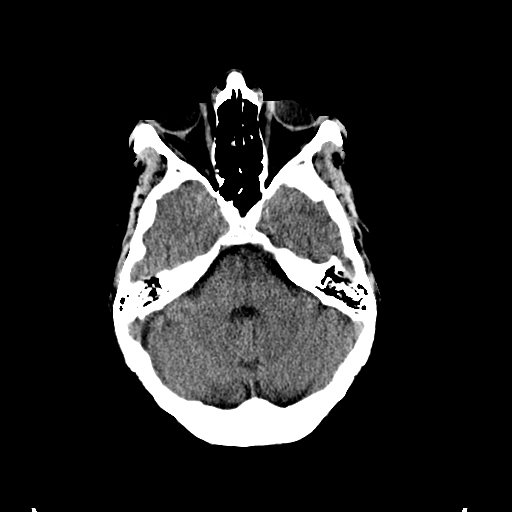
[im 11/36  brain]
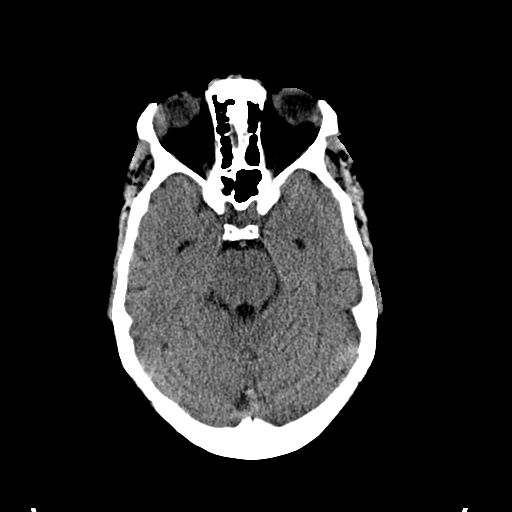
[im 11/36  bone]
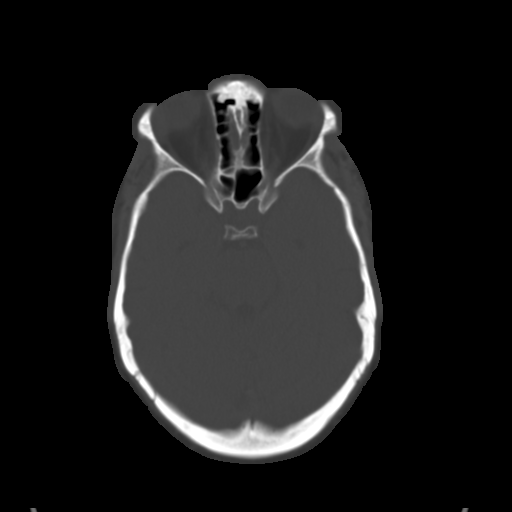
[im 12/36  brain]
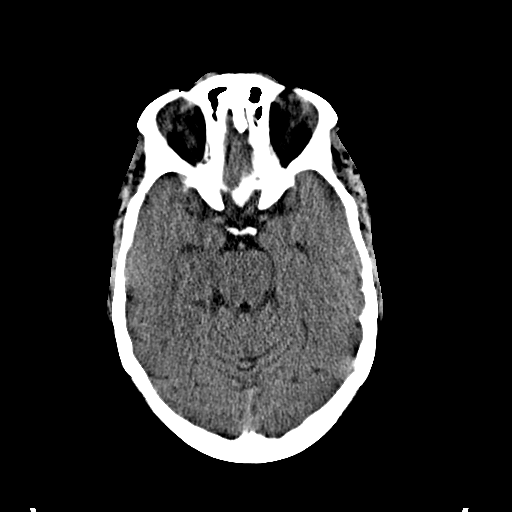
[im 16/36  brain]
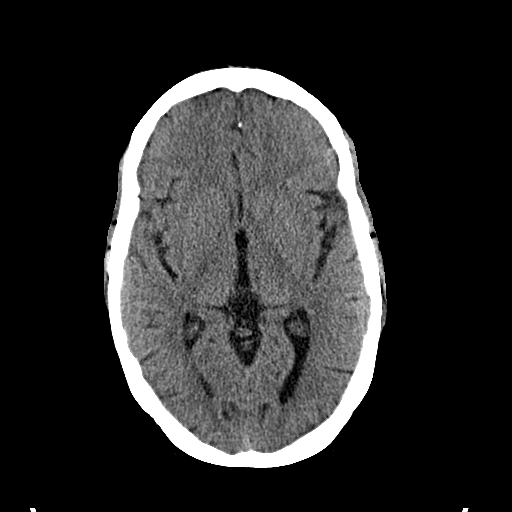
[im 17/36  brain]
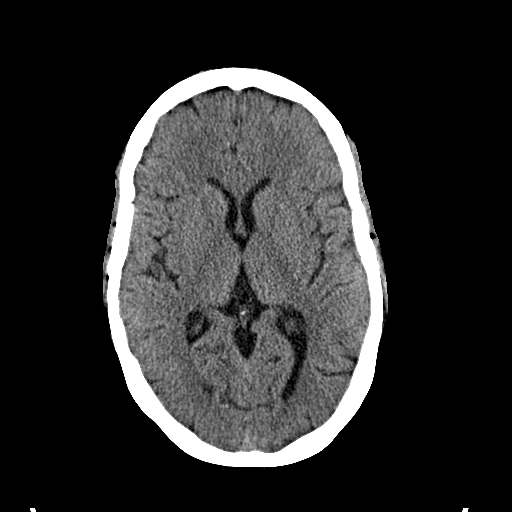
[im 19/36  brain]
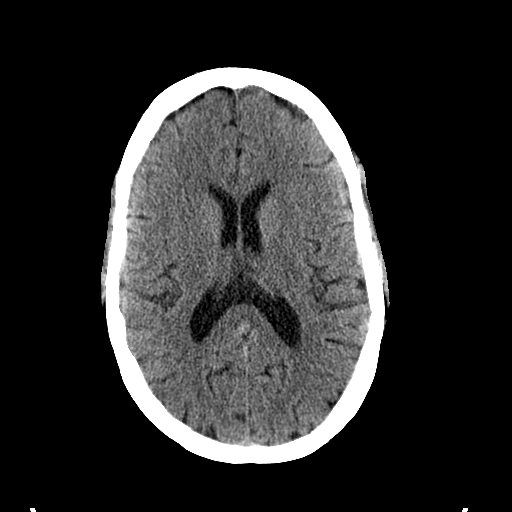
[im 19/36  bone]
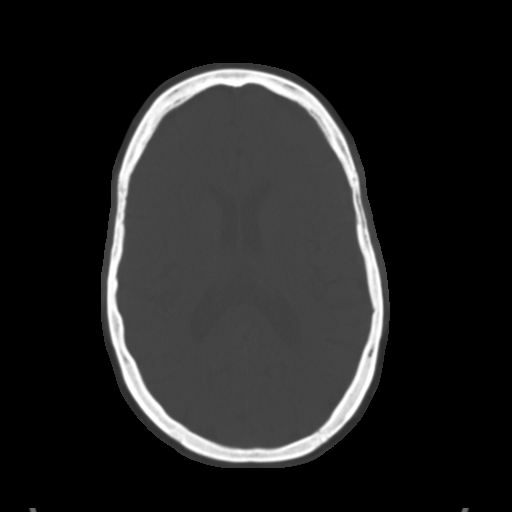
[im 21/36  brain]
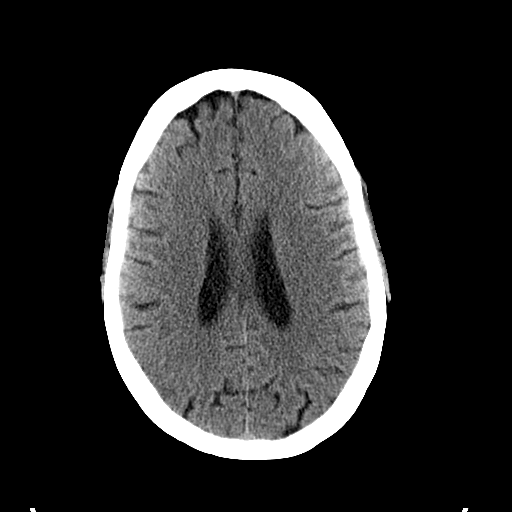
[im 24/36  brain]
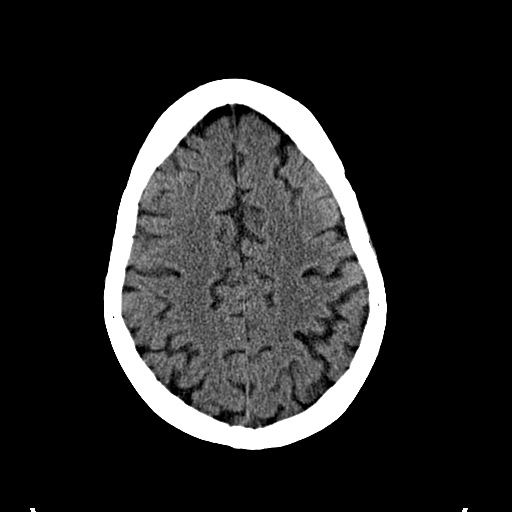
[im 26/36  brain]
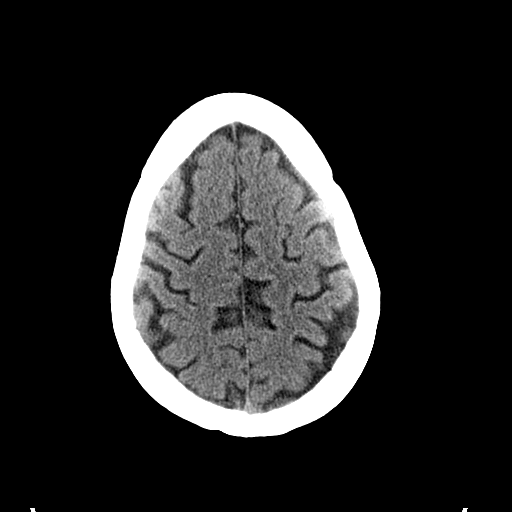
[im 27/36  brain]
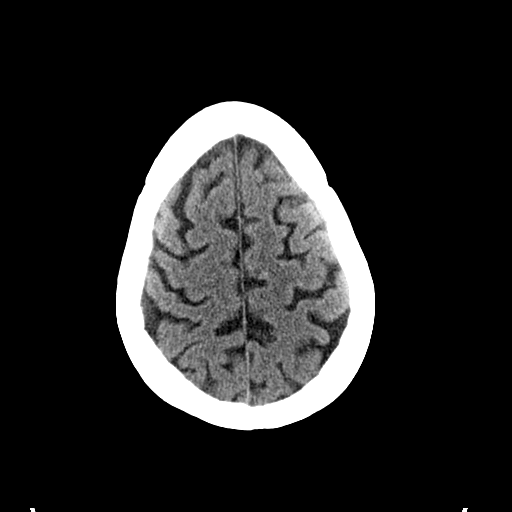
[im 27/36  bone]
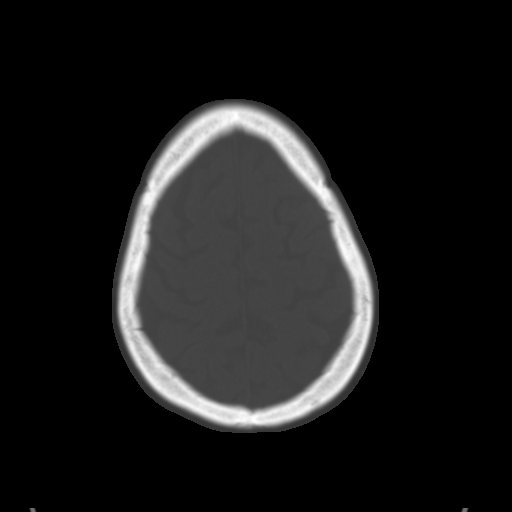
[im 29/36  brain]
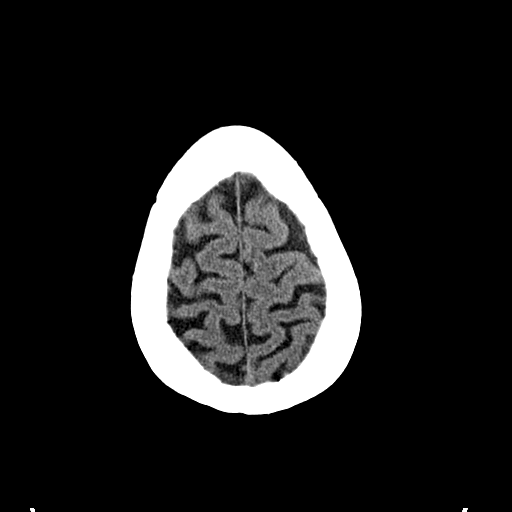
[im 32/36  brain]
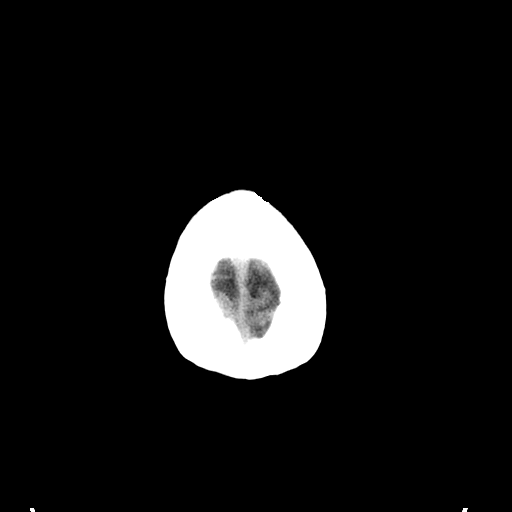
[im 34/36  brain]
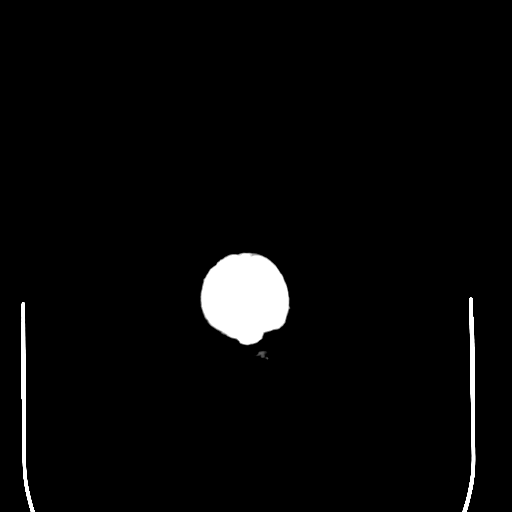

[16 of 30 positions shown; findings below may reference images not displayed]

FINDINGS: Motion artifact is noted at the skullbase. Images were repeated. No
acute hemorrhage, infarct, or mass lesion is identified. No midline
shift. Ventricles are normal in size. Orbits are unremarkable. No
skull fracture. Mild maxillary sinus mucoperiosteal thickening.
IMPRESSION: No acute intracranial finding.

## 2014-06-15 MED ORDER — ATENOLOL 100 MG PO TABS
100.0000 mg | ORAL_TABLET | Freq: Every day | ORAL | Status: DC
  Administered 2014-06-15 – 2014-06-16 (×2): 100 mg via ORAL
  Filled 2014-06-15 (×2): qty 1

## 2014-06-15 MED ORDER — HYDRALAZINE HCL 10 MG PO TABS
10.0000 mg | ORAL_TABLET | Freq: Three times a day (TID) | ORAL | Status: DC
  Administered 2014-06-15 – 2014-06-16 (×3): 10 mg via ORAL
  Filled 2014-06-15 (×6): qty 1

## 2014-06-15 MED ORDER — POTASSIUM CHLORIDE CRYS ER 20 MEQ PO TBCR
40.0000 meq | EXTENDED_RELEASE_TABLET | Freq: Once | ORAL | Status: AC
  Administered 2014-06-15: 40 meq via ORAL
  Filled 2014-06-15: qty 2

## 2014-06-15 NOTE — Progress Notes (Signed)
Patient ID: Tara [name], female   DOB: 03-25-1966, 48 y.o.   MRN: [id number]  [facility] PROGRESS NOTE  Tara [name] DVV:[id number] DOB: 1966/08/05 DOA: 06/14/2014 PCP: Stephens Shire, MD  Brief narrative; 48 year old female with a history of hypertension, anxiety, and alcohol dependence presents with two-day history of bilateral lower extremity weakness and numbness. She also describes a burning sensation in her bilateral feet during this period of time. The patient initially attributed this to panic attacks and anxiety due to her recent trip to the mountains of [place], but when her symptoms persisted, the patient was brought to the emergency department by her fianc. Her symptoms progressed to the point where she has difficulty getting up to walk this morning. The patient denied any headache, visual loss, dysarthria, focal upper extremity weakness, back pain, dysuria, bowel or bladder incontinence. She presently denies any chest discomfort but complains of some dyspnea. She denies any fevers, chills, coughing, hemoptysis, vomiting, abdominal pain, dysuria, hematochezia, melena. She has some nausea. The patient states that she drinks one glass of wine 3 times per week. However the patient's mother stated that the patient drinks on a daily basis although it is unclear how much she drinks. According to the patient, her last drink was 8 days ago. The patient states that she used to drink more heavily, and has been drinking approximately 20 years. She denies any illegal drug use. The patient smokes 3/4 ppd x10 years.   In the emergency department, point of care electrolytes revealed potassium 2.5, magnesium 0.6. CBC was 11.0 with hemoglobin 16.4 and platelets 111,000. The patient was given 2 g of magnesium sulfate IV, 40 mEq of potassium chloride po, 40 mEq of potassium chloride IV. EKG shows sinus rhythm with nonspecific T-wave changes. Chest x-ray was negative for infiltrates or pulmonary  edema. CT angiogram of the chest was negative for pulmonary embolus.   Assessment/Plan:  Sensory disturbance/lower extremity weakness  -Primarily attributable to the patient's hypokalemia and hypomagnesemia  -Symptomatically improving  -Cannot rule out degree of alcohol peripheral neuropathy  -CT brain without contrast pending  -Urine drug screen pending  -Serum B12, RBC folate, HIV, RPR, TSH still pending  Alcohol withdrawal  -This is likely the cause of the patient's tremulousness  -Mother revealed that pt has been drinking daily  -pt only endorsed drinking 1 glass wine 3X/week  -CIWA protocol  Hypokalemia/hypomagnesemia  -Secondary to poor oral intake  -Continue to supplement  Thrombocytopenia  -Likely due to chronic alcohol use  -Serum B12, RBC folate, HIV  Requested  Anxiety  -Continue Paxil  Hypertension  -resume home medical regimen  Acrocyanosis of toes  -DP and PT pulses palapable  -improved with socks  Dyspnea  -Suspect anxiety and alcohol withdrawal  -CT angiogram chest negative for pulmonary embolus  -Stable on room air  -EKG with nonspecific T-wave changes   DVT prophylaxis  Lovenox SQ while pt is in hospital  Code Status: Full Family Communication: Pt at bedside Disposition Plan: Home when medically stable  IV Access:   Peripheral IV Procedures and diagnostic studies:   Dg Chest 1 View  06/14/2014  There is no acute cardiopulmonary abnormality.   Ct Head Wo Contrast  06/15/2014   No acute intracranial finding.   \ Ct Angio Chest Pe W/cm &/or Wo Cm  06/14/2014   No evidence of pulmonary embolism.  No evidence of acute cardiopulmonary disease.     Medical Consultants:   None  Other Consultants:   Physical therapy  Anti-Infectives:  None   Faye Ramsay, MD  [facility] Pager [phone]  If 7PM-7AM, please contact night-coverage [email or web address] Password [facility] 06/15/2014, 7:13 PM   LOS: 1 day   HPI/Subjective: No events overnight.    Objective: Filed Vitals:   06/15/14 0750 06/15/14 1212 06/15/14 1413 06/15/14 1601  BP: 159/108 164/88 157/100 163/87  Pulse: 65 67 62 63  Temp: 97.5 F (36.4 C)  98 F (36.7 C)   TempSrc: Oral  Oral   Resp: 18  18   Height:      Weight:      SpO2: 94%  92%     Intake/Output Summary (Last 24 hours) at 06/15/14 1913 Last data filed at 06/15/14 1812  Gross per 24 hour  Intake   2731 ml  Output      0 ml  Net   2731 ml    Exam:   General:  Pt is alert, follows commands appropriately, not in acute distress  Cardiovascular: Regular rate and rhythm, S1/S2, no murmurs, no rubs, no gallops  Respiratory: Clear to auscultation bilaterally, no wheezing, no crackles, no rhonchi  Abdomen: Soft, non tender, non distended, bowel sounds present, no guarding  Extremities: No edema, pulses DP and PT palpable bilaterally  Data Reviewed: Basic Metabolic Panel:  Recent Labs Lab 06/14/14 0541 06/14/14 0551 06/14/14 1405 06/15/14 0145  NA  --  135* 140 140  K 2.7* 2.5* 3.0* 3.4*  CL  --  91* 97 102  CO2  --   --  29 26  GLUCOSE  --  96 82 84  BUN  --  19 20 18   CREATININE  --  0.90 0.95 0.87  CALCIUM  --   --  8.5 8.0*  MG 0.6*  --   --  2.0   Liver Function Tests:  Recent Labs Lab 06/14/14 1405 06/15/14 0145  AST 24 22  ALT 13 12  ALKPHOS 82 74  BILITOT 1.9* 1.1  PROT 6.3 5.7*  ALBUMIN 3.6 3.2*   CBC:  Recent Labs Lab 06/14/14 0541 06/14/14 0551 06/15/14 0145  WBC 7.0  --  4.2  NEUTROABS 4.5  --   --   HGB 16.4* 18.4* 12.6  HCT 46.9* 54.0* 36.3  MCV 106.1*  --  105.2*  PLT 111*  --  93*   Cardiac Enzymes:  Recent Labs Lab 06/14/14 1405 06/14/14 2007 06/15/14 0145  TROPONINI <0.30 <0.30 <0.30   CBG:  Recent Labs Lab 06/15/14 0739  GLUCAP 83    Scheduled Meds: . atenolol  100 mg Oral Daily  . enoxaparin (LOVENOX) injection  40 mg Subcutaneous Q24H  . folic acid  1 mg Oral Daily  . hydrALAZINE  10 mg Oral 3 times per day  . LORazepam   0-4 mg Intravenous 4 times per day   Followed by  . [START ON 06/17/2014] LORazepam  0-4 mg Intravenous Q12H  . magnesium chloride  1 tablet Oral BID  . multivitamin with minerals  1 tablet Oral Daily  . PARoxetine  20 mg Oral BID  . sodium chloride  3 mL Intravenous Q12H  . thiamine  100 mg Oral Daily   Or  . thiamine  100 mg Intravenous Daily   Continuous Infusions: . sodium chloride 0.9 % 1,000 mL with potassium chloride 40 mEq infusion 100 mL/hr at 06/15/14 1248

## 2014-06-15 NOTE — Progress Notes (Signed)
Spoke with pt and mother together. Both seemed emotional. Offered prayer. PT was very tired and seemed to need alone time, while mother seemed in need of further emotional support. I went to a consult room with mother for further conversation. There are complex family relationships and stresses at play, indicating that pt may find stresses within family dynamics; Further assessment toward this end may be useful. Will follow up on Monday if pt still in hospital.    06/15/14 1500  Clinical Encounter Type  Visited With Family;Patient and family together  Visit Type Spiritual support;Initial  Referral From Nurse  Spiritual Encounters  Spiritual Needs Emotional;Prayer  Stress Factors  Patient Stress Factors Exhausted;Other (Comment) (Possibly: family relationships stress)  Family Stress Factors Health changes;Loss;Major life changes;Family relationships    Vanetta Mulders 06/15/2014 3:57 PM

## 2014-06-16 LAB — COMPREHENSIVE METABOLIC PANEL
ALT: 16 U/L (ref 0–35)
ANION GAP: 12 (ref 5–15)
AST: 43 U/L — ABNORMAL HIGH (ref 0–37)
Albumin: 3.3 g/dL — ABNORMAL LOW (ref 3.5–5.2)
Alkaline Phosphatase: 70 U/L (ref 39–117)
BUN: 13 mg/dL (ref 6–23)
CO2: 23 mEq/L (ref 19–32)
CREATININE: 0.54 mg/dL (ref 0.50–1.10)
Calcium: 7.7 mg/dL — ABNORMAL LOW (ref 8.4–10.5)
Chloride: 106 mEq/L (ref 96–112)
GFR calc Af Amer: >90 mL/min (ref >90)
GFR calc non Af Amer: >90 mL/min (ref >90)
Glucose, Bld: 92 mg/dL (ref 70–99)
Potassium: 4.3 mEq/L (ref 3.7–5.3)
Sodium: 141 mEq/L (ref 137–147)
TOTAL PROTEIN: 5.7 g/dL — AB (ref 6.0–8.3)
Total Bilirubin: 0.7 mg/dL (ref 0.3–1.2)

## 2014-06-16 LAB — CBC
HEMATOCRIT: 35.9 % — AB (ref 36.0–46.0)
HEMOGLOBIN: 12 g/dL (ref 12.0–15.0)
MCH: 36.6 pg — ABNORMAL HIGH (ref 26.0–34.0)
MCHC: 33.4 g/dL (ref 30.0–36.0)
MCV: 109.5 fL — ABNORMAL HIGH (ref 78.0–100.0)
Platelets: 88 10*3/uL — ABNORMAL LOW (ref 150–400)
RBC: 3.28 MIL/uL — ABNORMAL LOW (ref 3.87–5.11)
RDW: 14.4 % (ref 11.5–15.5)
WBC: 4.2 10*3/uL (ref 4.0–10.5)

## 2014-06-16 MED ORDER — HYDRALAZINE HCL 20 MG/ML IJ SOLN: 5.0000 mg | INTRAMUSCULAR | Status: DC | PRN

## 2014-06-16 MED ORDER — HYDRALAZINE HCL 25 MG PO TABS
25.0000 mg | ORAL_TABLET | Freq: Three times a day (TID) | ORAL | Status: DC
  Administered 2014-06-16: 25 mg via ORAL
  Filled 2014-06-16 (×3): qty 1

## 2014-06-16 NOTE — Progress Notes (Signed)
Patient ID: Tara [name], female   DOB: [birth year], 48 y.o.   MRN: [id number] [facility] PROGRESS NOTE  Tara [name] [name] DZH:[id number] DOB: 11-17-1965 DOA: 06/14/2014 PCP: Stephens Shire, MD  Brief narrative;  48 year old female with a history of hypertension, anxiety, and alcohol dependence presents with two-day history of bilateral lower extremity weakness and numbness. She also describes a burning sensation in her bilateral feet during this period of time. The patient initially attributed this to panic attacks and anxiety due to her recent trip to the mountains of [place], but when her symptoms persisted, the patient was brought to the emergency department by her fianc. Her symptoms progressed to the point where she has difficulty getting up to walk this morning. The patient denied any headache, visual loss, dysarthria, focal upper extremity weakness, back pain, dysuria, bowel or bladder incontinence. She presently denies any chest discomfort but complains of some dyspnea. She denies any fevers, chills, coughing, hemoptysis, vomiting, abdominal pain, dysuria, hematochezia, melena. She has some nausea. The patient states that she drinks one glass of wine 3 times per week. However the patient's mother stated that the patient drinks on a daily basis although it is unclear how much she drinks. According to the patient, her last drink was 8 days ago. The patient states that she used to drink more heavily, and has been drinking approximately 20 years. She denies any illegal drug use. The patient smokes 3/4 ppd x10 years.   In the emergency department, point of care electrolytes revealed potassium 2.5, magnesium 0.6. CBC was 11.0 with hemoglobin 16.4 and platelets 111,000. The patient was given 2 g of magnesium sulfate IV, 40 mEq of potassium chloride po, 40 mEq of potassium chloride IV. EKG shows sinus rhythm with nonspecific T-wave changes. Chest x-ray was negative for infiltrates or pulmonary  edema. CT angiogram of the chest was negative for pulmonary embolus.   Assessment/Plan:  Sensory disturbance/lower extremity weakness  -Primarily attributable to the patient's hypokalemia and hypomagnesemia  -Symptomatically improving and reports feeling better this am  -Cannot rule out degree of alcohol peripheral neuropathy  -CT brain without contrast with no acute intracranial abnormalities  -Urine drug screen negative  -Serum B12, RBC folate, HIV, RPR, TSH negative  Alcohol withdrawal  -This is likely the cause of the patient's tremulousness  -Mother revealed that pt has been drinking daily  -pt only endorsed drinking 1 glass wine 3X/week  -CIWA protocol  Hypokalemia/hypomagnesemia  -Secondary to poor oral intake  - supplemented and WNL this AM Thrombocytopenia  -Likely due to chronic alcohol use  - no signs of active bleeding - repeat CBC in AM Anxiety  -Continue Paxil  Hypertension  -resume home medical regimen  - add hydralazine scheduled and as needed  Acrocyanosis of toes  -DP and PT pulses palapable  -improved with socks  Dyspnea  -Suspect anxiety and alcohol withdrawal  -CT angiogram chest negative for pulmonary embolus  -Stable on room air  -EKG with nonspecific T-wave changes   DVT prophylaxis  Lovenox SQ while pt is in hospital  Code Status: Full  Family Communication: Pt at bedside  Disposition Plan: Home when medically stable   IV Access:   Peripheral IV Procedures and diagnostic studies:   Dg Chest 1 View 06/14/2014 There is no acute cardiopulmonary abnormality.  Ct Head Wo Contrast 06/15/2014 No acute intracranial finding. \  Ct Angio Chest Pe W/cm &/or Wo Cm 06/14/2014 No evidence of pulmonary embolism. No evidence of acute cardiopulmonary disease.  Medical Consultants:  None  Other Consultants:   None Anti-Infectives:   None   Faye Ramsay, MD  [facility] Pager [phone]  If 7PM-7AM, please contact night-coverage [email or web address] Password  [facility] 06/16/2014, 12:26 PM   LOS: 2 days   HPI/Subjective: No events overnight.   Objective: Filed Vitals:   06/15/14 1601 06/15/14 2116 06/16/14 0546 06/16/14 0700  BP: 163/87 155/89 186/107 186/97  Pulse: 63 77 61   Temp:  98.4 F (36.9 C) 97.4 F (36.3 C)   TempSrc:  Oral Oral   Resp:  18 18   Height:      Weight:      SpO2:  94% 95%     Intake/Output Summary (Last 24 hours) at 06/16/14 1226 Last data filed at 06/16/14 0532  Gross per 24 hour  Intake 2724.33 ml  Output      0 ml  Net 2724.33 ml    Exam:   General:  Pt is alert, follows commands appropriately, not in acute distress  Cardiovascular: Regular rate and rhythm, S1/S2, no murmurs, no rubs, no gallops  Respiratory: Clear to auscultation bilaterally, no wheezing, no crackles, no rhonchi  Abdomen: Soft, non tender, non distended, bowel sounds present, no guarding  Extremities: No edema, pulses DP and PT palpable bilaterally  Neuro: Grossly nonfocal  Data Reviewed: Basic Metabolic Panel:  Recent Labs Lab 06/14/14 0541 06/14/14 0551 06/14/14 1405 06/15/14 0145 06/16/14 0518  NA  --  135* 140 140 141  K 2.7* 2.5* 3.0* 3.4* 4.3  CL  --  91* 97 102 106  CO2  --   --  29 26 23   GLUCOSE  --  96 82 84 92  BUN  --  19 20 18 13   CREATININE  --  0.90 0.95 0.87 0.54  CALCIUM  --   --  8.5 8.0* 7.7*  MG 0.6*  --   --  2.0  --    Liver Function Tests:  Recent Labs Lab 06/14/14 1405 06/15/14 0145 06/16/14 0518  AST 24 22 43*  ALT 13 12 16   ALKPHOS 82 74 70  BILITOT 1.9* 1.1 0.7  PROT 6.3 5.7* 5.7*  ALBUMIN 3.6 3.2* 3.3*   CBC:  Recent Labs Lab 06/14/14 0541 06/14/14 0551 06/15/14 0145 06/16/14 0518  WBC 7.0  --  4.2 4.2  NEUTROABS 4.5  --   --   --   HGB 16.4* 18.4* 12.6 12.0  HCT 46.9* 54.0* 36.3 35.9*  MCV 106.1*  --  105.2* 109.5*  PLT 111*  --  93* 88*   Cardiac Enzymes:  Recent Labs Lab 06/14/14 1405 06/14/14 2007 06/15/14 0145  TROPONINI <0.30 <0.30 <0.30    BNP: No components found with this basename: POCBNP,  CBG:  Recent Labs Lab 06/15/14 0739  GLUCAP 83    No results found for this or any previous visit (from the past 240 hour(s)).   Scheduled Meds: . atenolol  100 mg Oral Daily  . enoxaparin (LOVENOX) injection  40 mg Subcutaneous Q24H  . folic acid  1 mg Oral Daily  . hydrALAZINE  10 mg Oral 3 times per day  . LORazepam  0-4 mg Intravenous 4 times per day   Followed by  . [START ON 06/17/2014] LORazepam  0-4 mg Intravenous Q12H  . magnesium chloride  1 tablet Oral BID  . multivitamin with minerals  1 tablet Oral Daily  . PARoxetine  20 mg Oral BID  . sodium chloride  3 mL Intravenous Q12H  . thiamine  100 mg Oral Daily   Or  . thiamine  100 mg Intravenous Daily   Continuous Infusions: . sodium chloride 0.9 % 1,000 mL with potassium chloride 40 mEq infusion 100 mL/hr at 06/16/14 0848

## 2014-06-16 NOTE — Progress Notes (Signed)
Pt's mother called unit, states that pt's fiance picked her up from the hospital. States pt is safe. Pt's mother stated that she may try to bring pt back to the hospital. This RN instructed that pt would need to come to the ED first to be readmitted. Noreene Larsson RN, BSN

## 2014-06-16 NOTE — Progress Notes (Addendum)
Pt pulled out IV and walked off unit. Security notified and searching building for patient. MD notified. Noreene Larsson RN, BSN

## 2014-06-17 LAB — FOLATE RBC: RBC Folate: 444 ng/mL (ref >280)

## 2014-06-18 NOTE — Discharge Summary (Addendum)
Physician Discharge Summary  Tara [name] KYH:[id number] DOB: [birth year] DOA: 06/14/2014  PCP: Stephens Shire, MD  Admit date: 06/14/2014 Discharge date: 06/16/2014  Recommendations for Outpatient Follow-up:  1. Pt will need to follow up with PCP in 2-3 weeks post discharge 2. Please obtain BMP to evaluate electrolytes and kidney function 3. Please also check CBC to evaluate Hg and Hct levels 4. Pt left the unit against medical advised   Discharge Diagnoses:  Active Problems:   Sensory disturbance  Discharge Condition: Stable  Diet recommendation: Pt eloped and this was not discussed   Brief narrative;  48 year old female with a history of hypertension, anxiety, and alcohol dependence presents with two-day history of bilateral lower extremity weakness and numbness. She also describes a burning sensation in her bilateral feet during this period of time. The patient initially attributed this to panic attacks and anxiety due to her recent trip to the mountains of [place], but when her symptoms persisted, the patient was brought to the emergency department by her fianc. Her symptoms progressed to the point where she has difficulty getting up to walk this morning. The patient denied any headache, visual loss, dysarthria, focal upper extremity weakness, back pain, dysuria, bowel or bladder incontinence. She presently denies any chest discomfort but complains of some dyspnea. She denies any fevers, chills, coughing, hemoptysis, vomiting, abdominal pain, dysuria, hematochezia, melena. She has some nausea. The patient states that she drinks one glass of wine 3 times per week. However the patient's mother stated that the patient drinks on a daily basis although it is unclear how much she drinks. According to the patient, her last drink was 8 days ago. The patient states that she used to drink more heavily, and has been drinking approximately 20 years. She denies any illegal drug use. The patient  smokes 3/4 ppd x10 years.   In the emergency department, point of care electrolytes revealed potassium 2.5, magnesium 0.6. CBC was 11.0 with hemoglobin 16.4 and platelets 111,000. The patient was given 2 g of magnesium sulfate IV, 40 mEq of potassium chloride po, 40 mEq of potassium chloride IV. EKG shows sinus rhythm with nonspecific T-wave changes. Chest x-ray was negative for infiltrates or pulmonary edema. CT angiogram of the chest was negative for pulmonary embolus.   Assessment/Plan:  Sensory disturbance/lower extremity weakness  -Primarily attributable to the patient's hypokalemia and hypomagnesemia  -Symptomatically improving and reports feeling better this am  - pt left the unit without approval, was supposed to be d/c in AM  -Cannot rule out degree of alcohol peripheral neuropathy  -CT brain without contrast with no acute intracranial abnormalities  -Urine drug screen negative  -Serum B12, RBC folate, HIV, RPR, TSH negative  Alcohol withdrawal  -This is likely the cause of the patient's tremulousness  -Mother revealed that pt has been drinking daily  -pt only endorsed drinking 1 glass wine 3X/week  Hypokalemia/hypomagnesemia  -Secondary to poor oral intake  - supplemented and WNL this AM  Thrombocytopenia  -Likely due to chronic alcohol use  - no signs of active bleeding  Anxiety  -Continue Paxil  Hypertension  -resume home medical regimen  Acrocyanosis of toes  -DP and PT pulses palapable  -improved with socks  Dyspnea  -Suspect anxiety and alcohol withdrawal  -CT angiogram chest negative for pulmonary embolus  -Stable on room air  -EKG with nonspecific T-wave changes   Code Status: Full    IV Access:   Peripheral IV Procedures and diagnostic studies:   Dg Chest  1 View 06/14/2014 There is no acute cardiopulmonary abnormality.  Ct Head Wo Contrast 06/15/2014 No acute intracranial finding. \  Ct Angio Chest Pe W/cm &/or Wo Cm 06/14/2014 No evidence of pulmonary  embolism. No evidence of acute cardiopulmonary disease.  Medical Consultants:   None  Other Consultants:   None Anti-Infectives:   None   Discharge Exam: Filed Vitals:   06/16/14 1826  BP: 179/91  Pulse: 58  Temp: 98.1 F (36.7 C)  Resp: 16   Filed Vitals:   06/16/14 0546 06/16/14 0700 06/16/14 1404 06/16/14 1826  BP: 186/107 186/97 179/107 179/91  Pulse: 61  64 58  Temp: 97.4 F (36.3 C)  97.7 F (36.5 C) 98.1 F (36.7 C)  TempSrc: Oral  Oral Oral  Resp: 18  18 16   Height:      Weight:      SpO2: 95%  95% 96%    General: Pt is alert, follows commands appropriately, not in acute distress Cardiovascular: Regular rate and rhythm, S1/S2 +, no murmurs, no rubs, no gallops Respiratory: Clear to auscultation bilaterally, no wheezing, no crackles, no rhonchi Abdominal: Soft, non tender, non distended, bowel sounds +, no guarding Extremities: no edema, no cyanosis, pulses palpable bilaterally DP and PT Neuro: Grossly nonfocal  Discharge Instructions     Medication List    ASK your doctor about these medications       atenolol 50 MG tablet  Commonly known as:  TENORMIN  Take 100 mg by mouth daily.     PARoxetine 20 MG tablet  Commonly known as:  PAXIL  Take 20 mg by mouth 2 (two) times daily.     VITAMIN B-12 PO  Take 1 tablet by mouth daily.     VITAMIN D (ERGOCALCIFEROL) PO  Take 1 tablet by mouth daily.          The results of significant diagnostics from this hospitalization (including imaging, microbiology, ancillary and laboratory) are listed below for reference.     Microbiology: No results found for this or any previous visit (from the past 240 hour(s)).   Labs: Basic Metabolic Panel:  Recent Labs Lab 06/14/14 0541 06/14/14 0551 06/14/14 1405 06/15/14 0145 06/16/14 0518  NA  --  135* 140 140 141  K 2.7* 2.5* 3.0* 3.4* 4.3  CL  --  91* 97 102 106  CO2  --   --  29 26 23   GLUCOSE  --  96 82 84 92  BUN  --  19 20 18 13   CREATININE   --  0.90 0.95 0.87 0.54  CALCIUM  --   --  8.5 8.0* 7.7*  MG 0.6*  --   --  2.0  --    Liver Function Tests:  Recent Labs Lab 06/14/14 1405 06/15/14 0145 06/16/14 0518  AST 24 22 43*  ALT 13 12 16   ALKPHOS 82 74 70  BILITOT 1.9* 1.1 0.7  PROT 6.3 5.7* 5.7*  ALBUMIN 3.6 3.2* 3.3*   No results found for this basename: LIPASE, AMYLASE,  in the last 168 hours No results found for this basename: AMMONIA,  in the last 168 hours CBC:  Recent Labs Lab 06/14/14 0541 06/14/14 0551 06/15/14 0145 06/16/14 0518  WBC 7.0  --  4.2 4.2  NEUTROABS 4.5  --   --   --   HGB 16.4* 18.4* 12.6 12.0  HCT 46.9* 54.0* 36.3 35.9*  MCV 106.1*  --  105.2* 109.5*  PLT 111*  --  93*  88*   Cardiac Enzymes:  Recent Labs Lab 06/14/14 1405 06/14/14 2007 06/15/14 0145  TROPONINI <0.30 <0.30 <0.30   BNP: BNP (last 3 results) No results found for this basename: PROBNP,  in the last 8760 hours CBG:  Recent Labs Lab 06/15/14 0739  GLUCAP 83     SIGNED: Time coordinating discharge: Over 30 minutes  Faye Ramsay, MD  [facility] 06/18/2014, 9:00 PM Pager [phone]  If 7PM-7AM, please contact night-coverage [email or web address] Password [email or web address]

## 2014-07-30 ENCOUNTER — Encounter (HOSPITAL_COMMUNITY): Payer: Self-pay | Admitting: Emergency Medicine

## 2016-02-28 ENCOUNTER — Emergency Department (HOSPITAL_COMMUNITY): Payer: BLUE CROSS/BLUE SHIELD

## 2016-02-28 ENCOUNTER — Emergency Department (HOSPITAL_COMMUNITY)
Admission: EM | Admit: 2016-02-28 | Discharge: 2016-02-28 | Disposition: A | Discharge status: Home / Self Care | Payer: BLUE CROSS/BLUE SHIELD | Attending: Emergency Medicine | Admitting: Emergency Medicine

## 2016-02-28 ENCOUNTER — Encounter (HOSPITAL_COMMUNITY): Payer: Self-pay | Admitting: *Deleted

## 2016-02-28 DIAGNOSIS — Y929 Unspecified place or not applicable: Secondary | ICD-10-CM | POA: Diagnosis not present

## 2016-02-28 DIAGNOSIS — I10 Essential (primary) hypertension: Secondary | ICD-10-CM | POA: Insufficient documentation

## 2016-02-28 DIAGNOSIS — S0101XA Laceration without foreign body of scalp, initial encounter: Secondary | ICD-10-CM | POA: Diagnosis not present

## 2016-02-28 DIAGNOSIS — Y9301 Activity, walking, marching and hiking: Secondary | ICD-10-CM | POA: Diagnosis not present

## 2016-02-28 DIAGNOSIS — S42001A Fracture of unspecified part of right clavicle, initial encounter for closed fracture: Secondary | ICD-10-CM | POA: Insufficient documentation

## 2016-02-28 DIAGNOSIS — S0990XA Unspecified injury of head, initial encounter: Secondary | ICD-10-CM | POA: Insufficient documentation

## 2016-02-28 DIAGNOSIS — S0181XA Laceration without foreign body of other part of head, initial encounter: Secondary | ICD-10-CM | POA: Diagnosis present

## 2016-02-28 DIAGNOSIS — Y999 Unspecified external cause status: Secondary | ICD-10-CM | POA: Insufficient documentation

## 2016-02-28 DIAGNOSIS — W19XXXA Unspecified fall, initial encounter: Secondary | ICD-10-CM

## 2016-02-28 DIAGNOSIS — F17210 Nicotine dependence, cigarettes, uncomplicated: Secondary | ICD-10-CM | POA: Insufficient documentation

## 2016-02-28 DIAGNOSIS — Z79899 Other long term (current) drug therapy: Secondary | ICD-10-CM | POA: Diagnosis not present

## 2016-02-28 DIAGNOSIS — W109XXA Fall (on) (from) unspecified stairs and steps, initial encounter: Secondary | ICD-10-CM | POA: Insufficient documentation

## 2016-02-28 IMAGING — CR DG SHOULDER 2+V*R*
3 series · 3 of 3 positions shown · non-contrast
Comparison: None.

CLINICAL DATA: Fall down steps this morning, probably landing on
shoulder. Anterior shoulder pain and limited range of motion.

EXAM:
RIGHT SHOULDER - 2+ VIEW

[x shoulder ap right (1 of 3)]
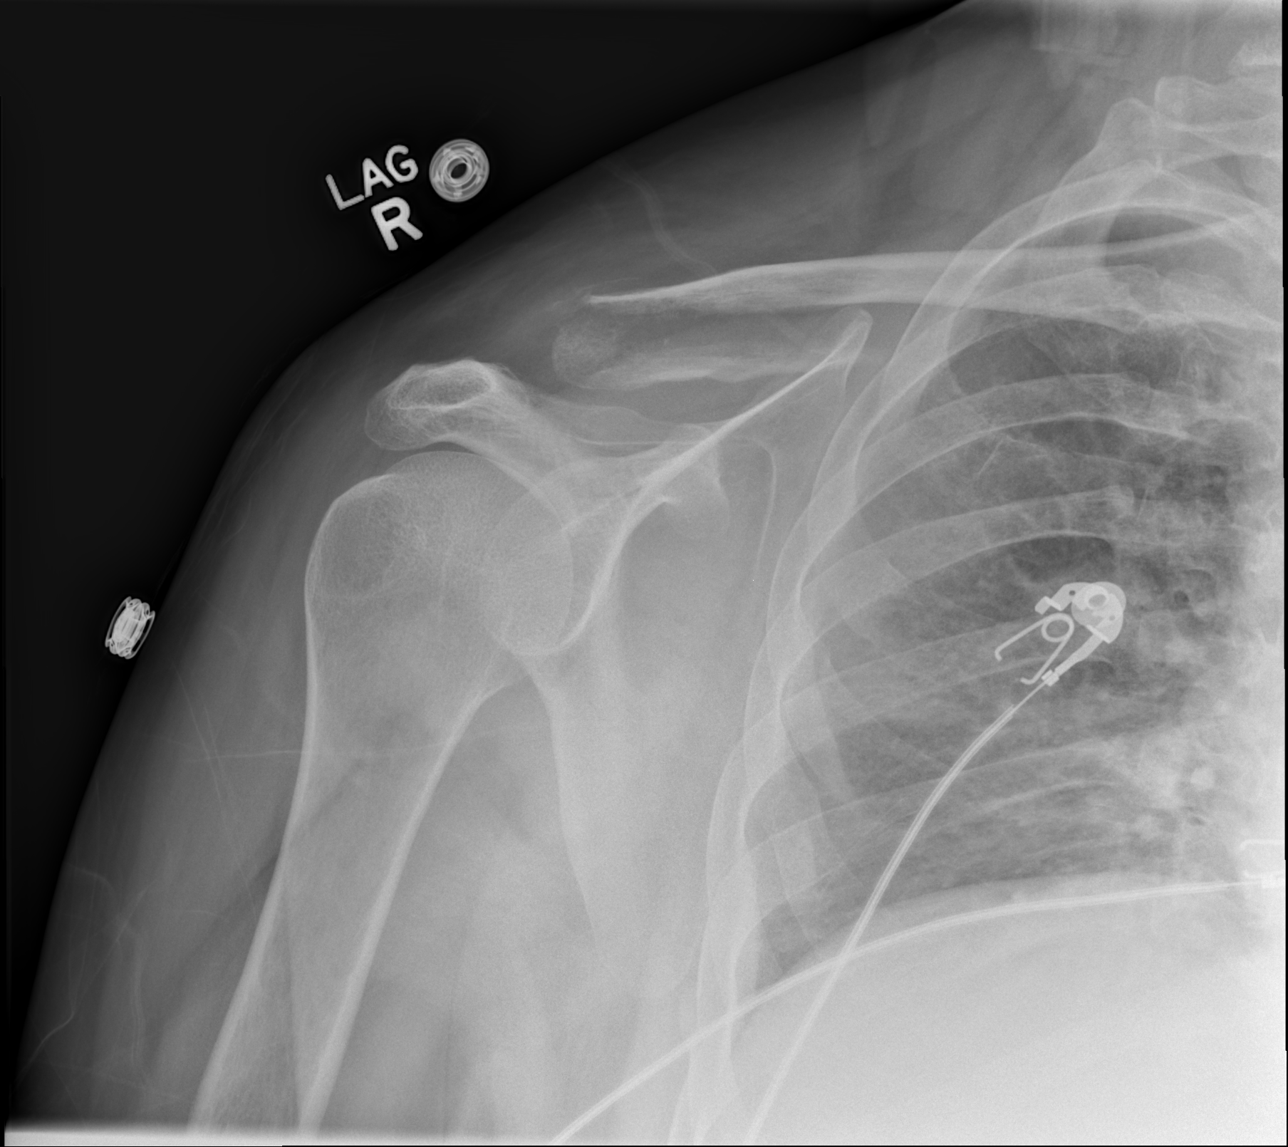

[x shoulder ap right (2 of 3)]
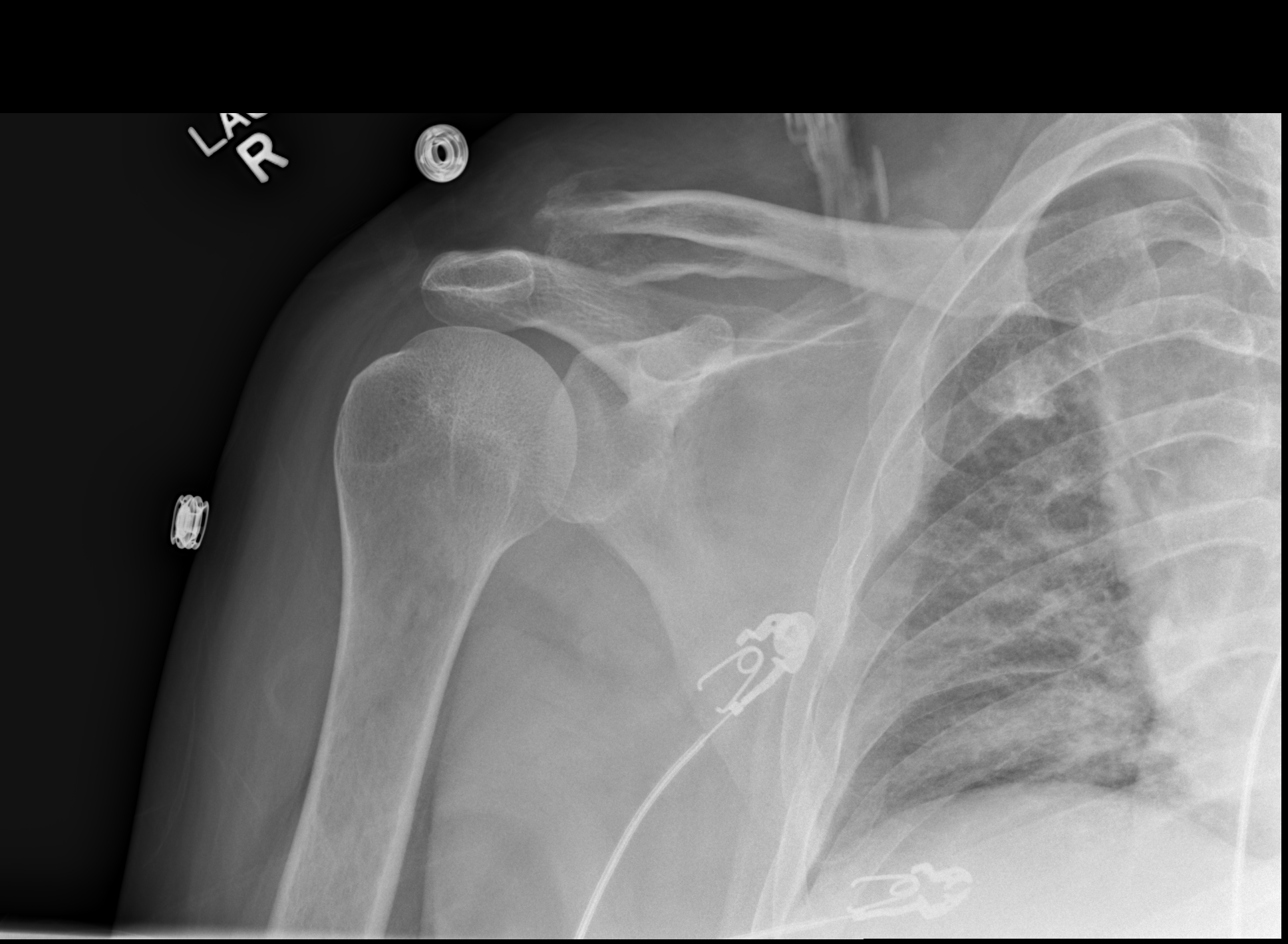

[x shoulder ap right (3 of 3)]
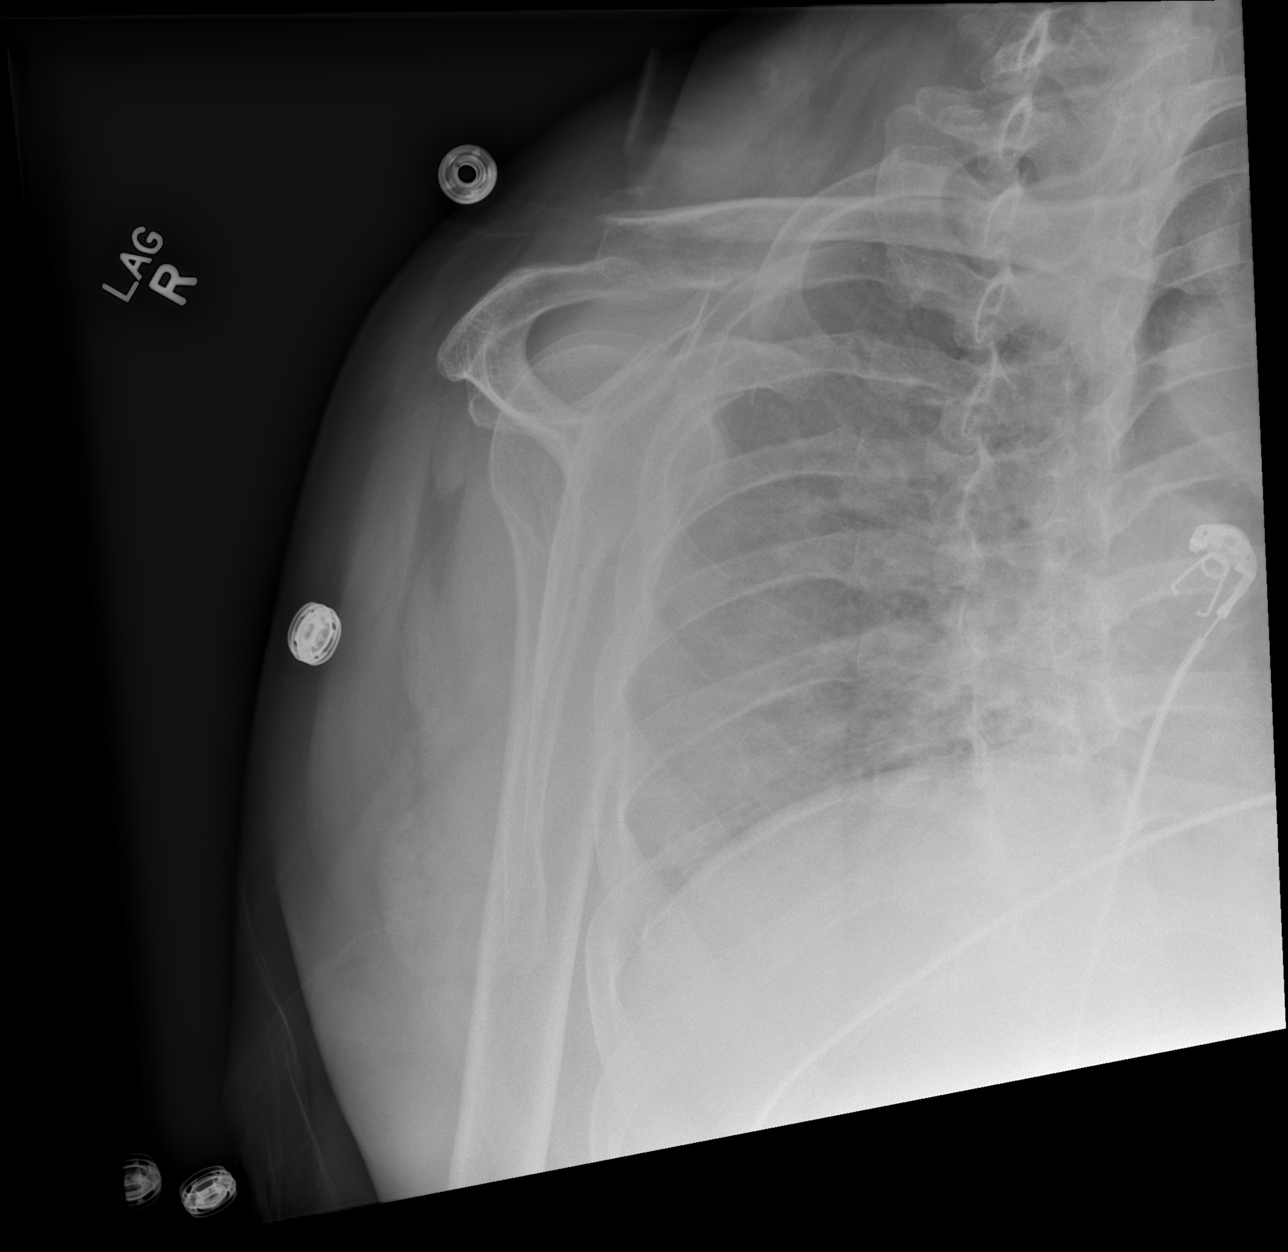

[3 of 3 positions shown; findings below may reference images not displayed]

FINDINGS: Displaced oblique fracture of the right distal clavicle extending to
the acromioclavicular joint. There is 5 mm osseous distraction.
Proximal no coracoclavicular joint space widening, with respect to
the inferior/ distal fragment. Glenohumeral alignment is maintained.
No additional acute fracture.
IMPRESSION: Displaced oblique right distal clavicle fracture extending to the
acromioclavicular joint.

## 2016-02-28 IMAGING — CT CT HEAD W/O CM
3 of 8 series · 12 of 47 positions shown, 14 images · non-contrast
Comparison: Head CT [DATE]

CLINICAL DATA: Fall down stairs tonight striking head on floor.
Laceration and hematoma. No loss of consciousness.

EXAM:
CT HEAD WITHOUT CONTRAST
CT CERVICAL SPINE WITHOUT CONTRAST
TECHNIQUE: Multidetector CT imaging of the head and cervical spine was
performed following the standard protocol without intravenous
contrast. Multiplanar CT image reconstructions of the cervical spine
were also generated.

[Series 6: sagittal · sagittal · 0.33mm/px · 2 of 52 slices shown]
[im 18/52  brain]
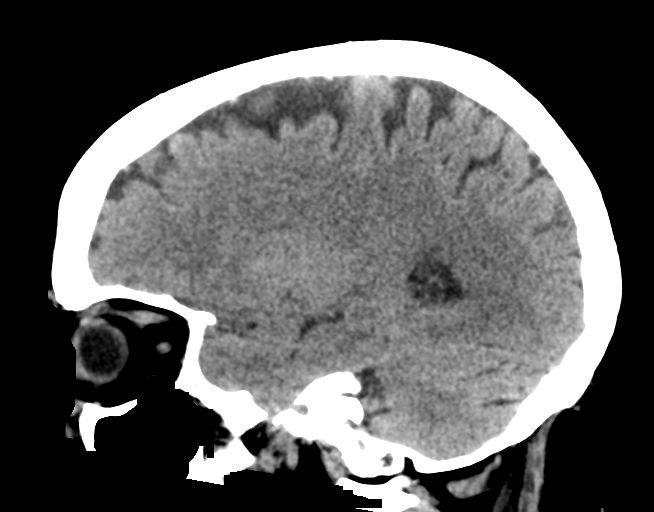
[im 35/52  brain]
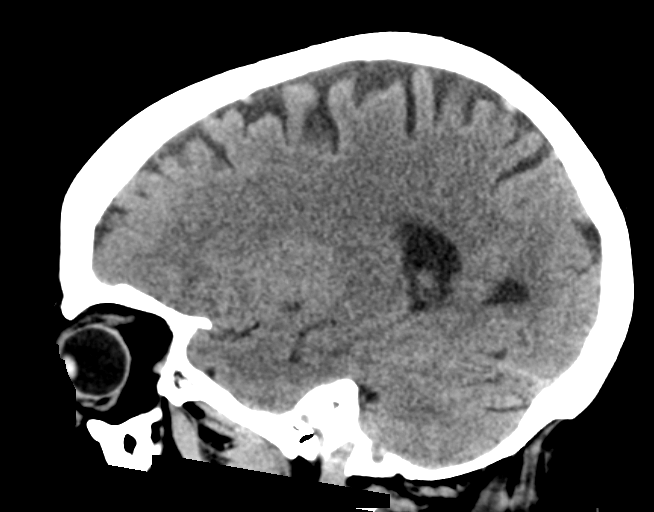

[Series 9: axial recon · axial · 0.18mm/px · z∈[+1259,+1388]mm · 8 of 92 slices shown, 10 images]
[im 11/92  brain]
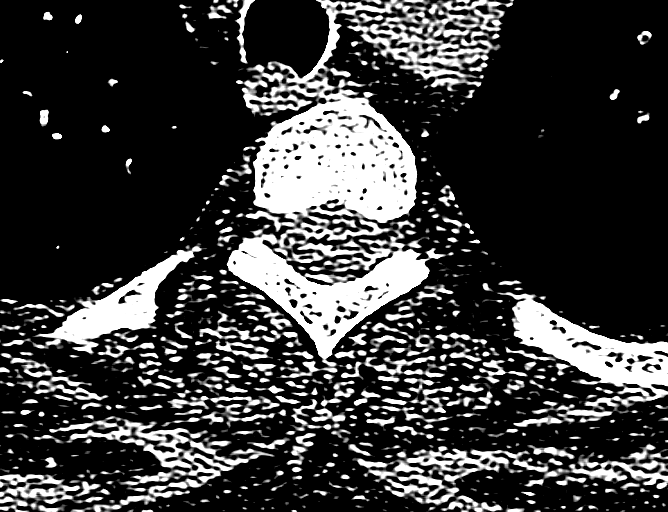
[im 11/92  bone]
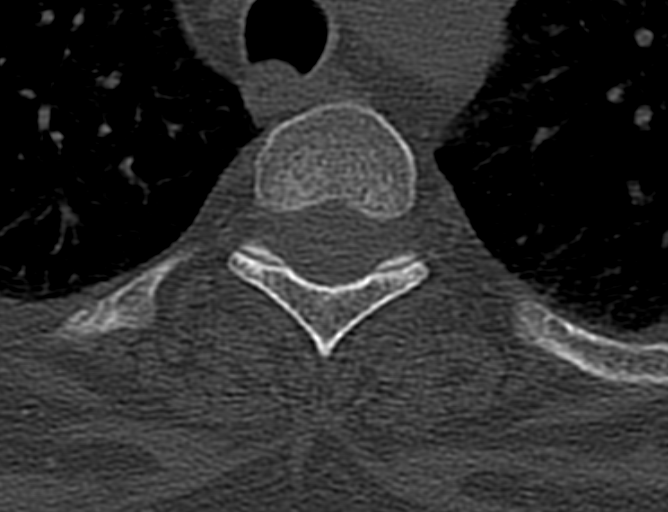
[im 21/92  brain]
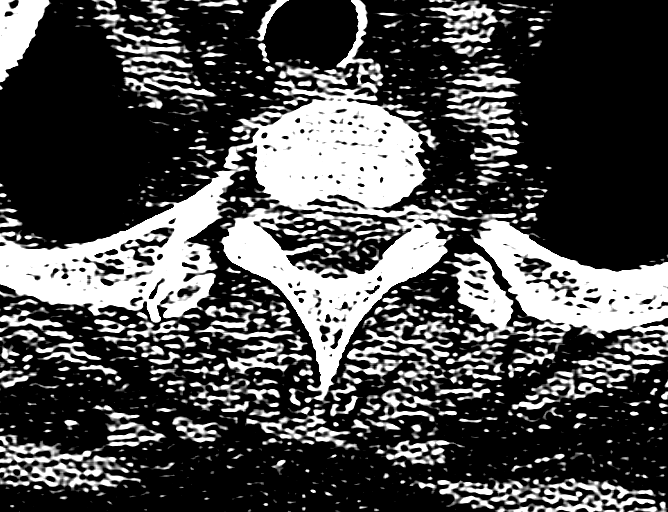
[im 31/92  brain]
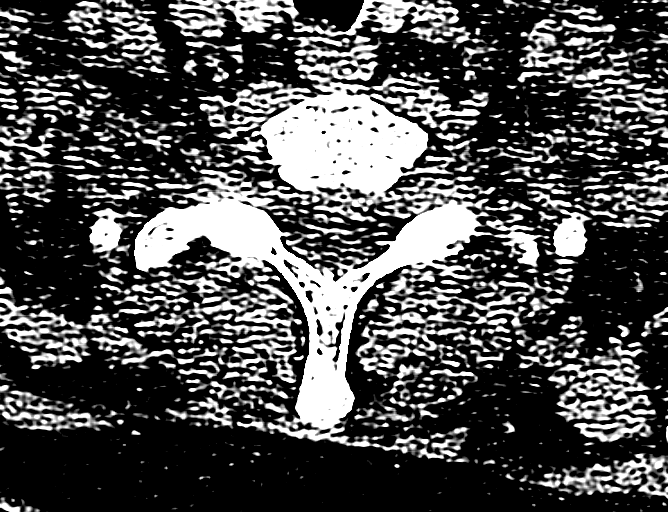
[im 41/92  brain]
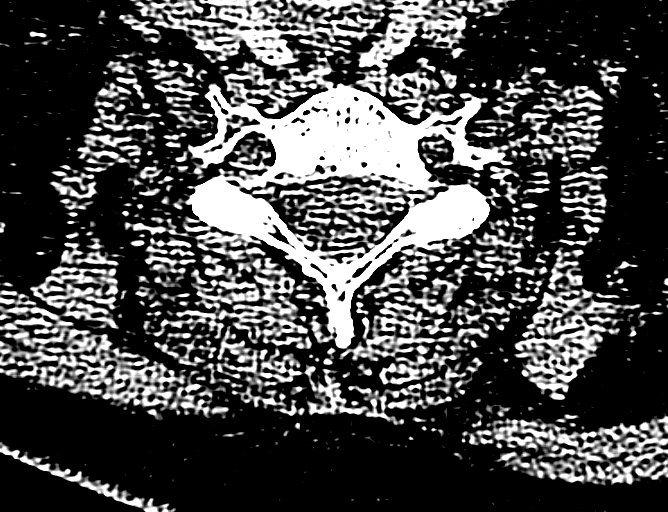
[im 51/92  brain]
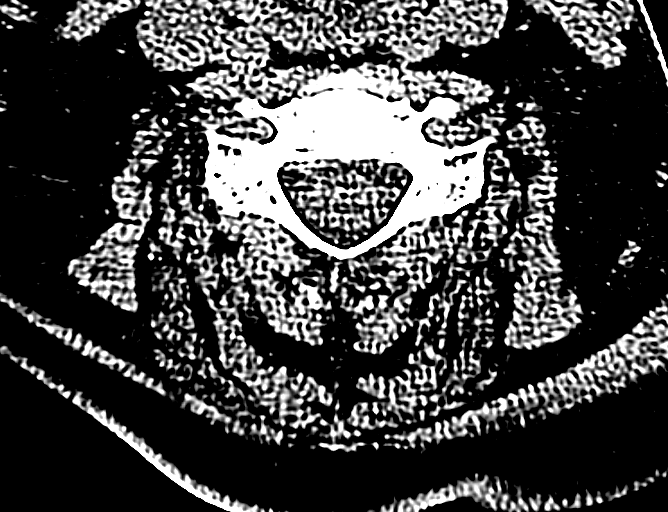
[im 51/92  bone]
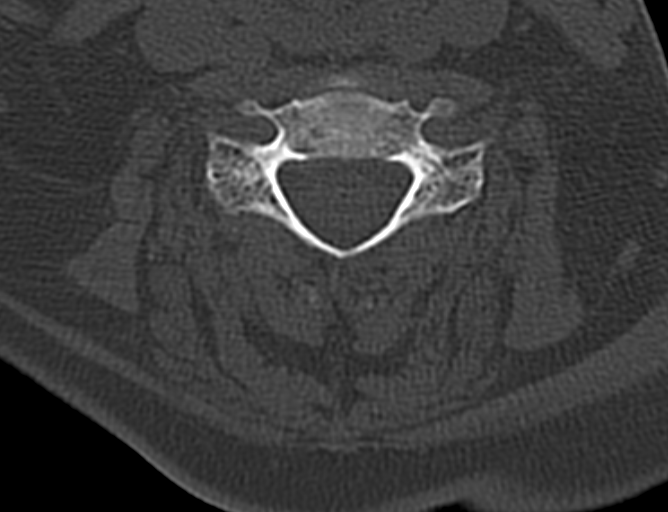
[im 61/92  brain]
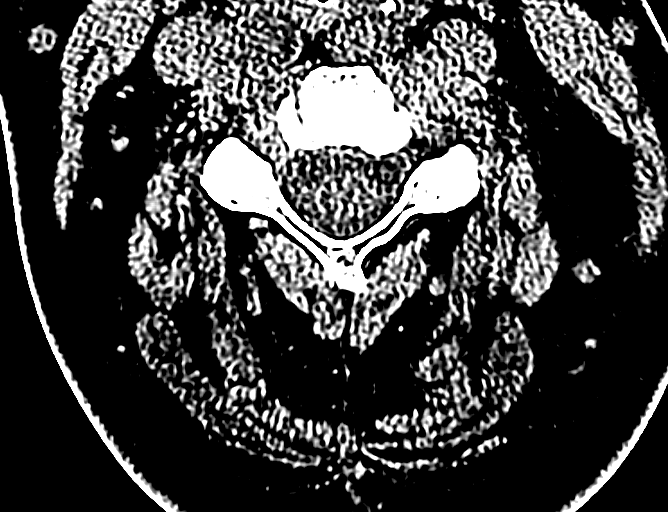
[im 71/92  brain]
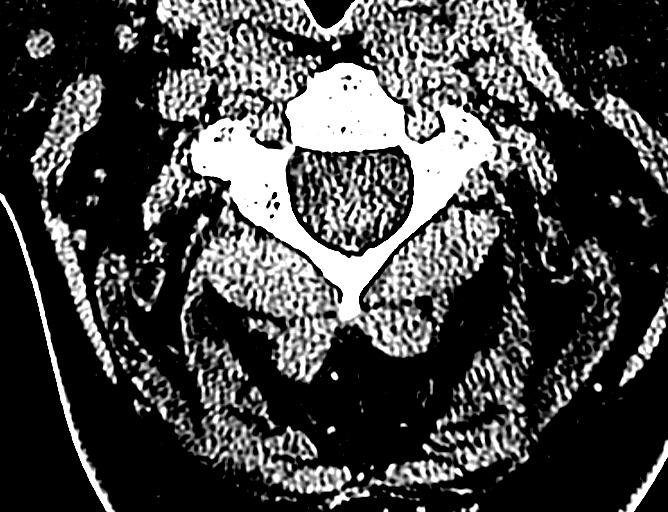
[im 81/92  brain]
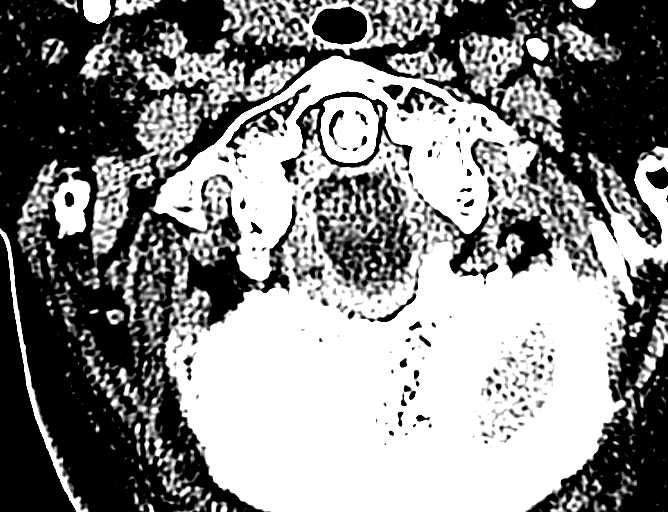

[Series 10: coronal · coronal · 0.22mm/px · 2 of 43 slices shown]
[im 15/43  brain]
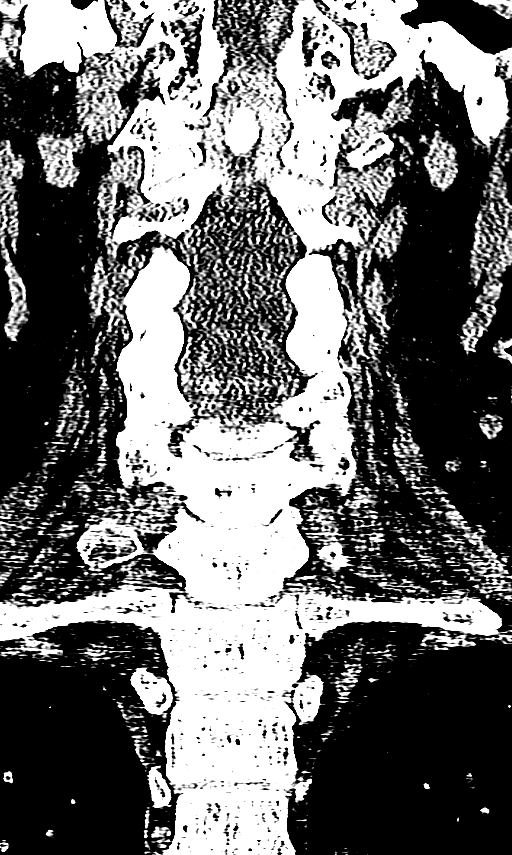
[im 29/43  brain]
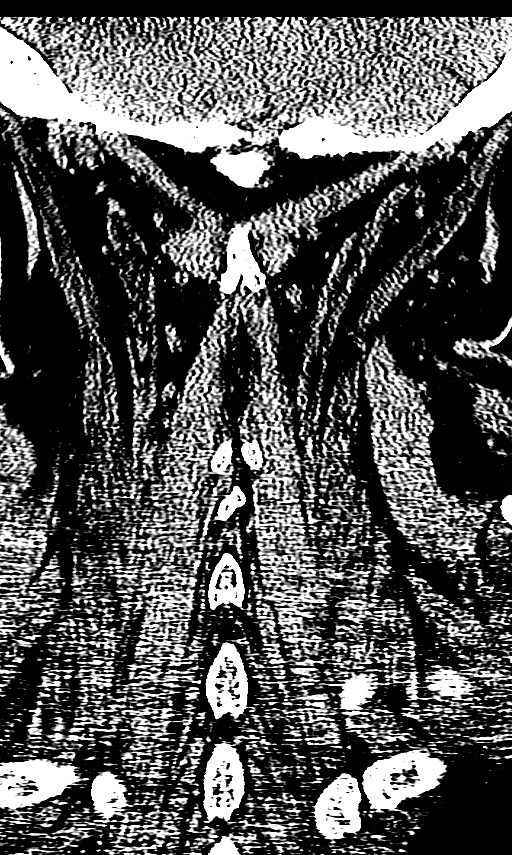

[12 of 47 positions shown; findings below may reference images not displayed]

FINDINGS: CT HEAD FINDINGS

No intracranial hemorrhage, mass effect, or midline shift. No
hydrocephalus. The basilar cisterns are patent. No evidence of
territorial infarct. No intracranial fluid collection. Right
temporoparietal scalp hematoma without subjacent fracture. Calvarium
is intact. Included paranasal sinuses and mastoid air cells are well
aerated.

CT CERVICAL SPINE FINDINGS

No fracture or acute subluxation. The dens is intact. There are no
jumped or perched facets. Vertebral body heights are maintained.
Mild disc space narrowing at C5-C6. No prevertebral soft tissue
edema.
IMPRESSION: 1. Right temporoparietal scalp hematoma without fracture or acute
intracranial abnormality.
2. No fracture or subluxation of the cervical spine.

## 2016-02-28 MED ORDER — HYDROCODONE-ACETAMINOPHEN 5-325 MG PO TABS
1.0000 | ORAL_TABLET | Freq: Once | ORAL | Status: AC
  Administered 2016-02-28: 1 via ORAL
  Filled 2016-02-28: qty 1

## 2016-02-28 MED ORDER — HYDROCODONE-ACETAMINOPHEN 5-325 MG PO TABS: 1.0000 | ORAL_TABLET | ORAL | Status: DC | PRN

## 2016-02-28 MED ORDER — IBUPROFEN 200 MG PO TABS
600.0000 mg | ORAL_TABLET | Freq: Once | ORAL | Status: AC
  Administered 2016-02-28: 600 mg via ORAL
  Filled 2016-02-28: qty 3

## 2016-02-28 MED ORDER — IBUPROFEN 600 MG PO TABS: 600.0000 mg | ORAL_TABLET | Freq: Three times a day (TID) | ORAL | Status: DC | PRN

## 2016-02-28 NOTE — ED Provider Notes (Signed)
CSN: [id number]     Arrival date & time 02/28/16  0106 History  By signing my name below, I, Irene Pap, attest that this documentation has been prepared under the direction and in the presence of Jola Schmidt, MD. Electronically Signed: Irene Pap, ED Scribe. 02/28/2016. 1:54 AM.    Chief Complaint  Patient presents with  . Fall  . Head Injury  . Shoulder Pain   The history is provided by the patient. No language interpreter was used.  HPI Comments: Tara [name] is a 50 y.o. female with a hx of essential HTN who presents to the Emergency Department complaining of a fall onset PTA. Per EMS, pt slipped down 2-3 stairs, hitting the right side of her head on the tile floor. She reports associated laceration to the right head with bleeding controlled and right shoulder pain that worsens with movement. Pt arrives to the ED in a c-collar. Pt admits to ETOH consumption this evening. Pt was given 150 mcg Fentanyl and 4 mg Zofran en route. She denies LOC, chest pain, SOB, abdominal pain, nausea, vomiting, numbness, or weakness.   Past Medical History  Diagnosis Date  . Anxiety state, unspecified   . Essential hypertension, benign   . Depression   . History of pneumonia    Past Surgical History  Procedure Laterality Date  . Eye surgery      left  . Breast lumpectomy     Family History  Problem Relation Age of Onset  . Breast cancer Paternal Grandmother   . Breast cancer Mother   . Diabetes Mother     pre-diabetes  . Ovarian cancer Maternal Grandmother   . Prostate cancer Father   . Hypertension Father   . Stomach cancer Cousin   . Colon cancer Paternal Grandfather   . Stroke Maternal Grandfather    Social History  Substance Use Topics  . Smoking status: Current Every Day Smoker -- 1.00 packs/day for 20 years    Types: Cigarettes  . Smokeless tobacco: Never Used  . Alcohol Use: 4.2 oz/week    7 Glasses of wine per week   OB History    Gravida Para Term Preterm AB TAB SAB  Ectopic Multiple Living   1    1 1     0     Review of Systems A complete 10 system review of systems was obtained and all systems are negative except as noted in the HPI and PMH.   Allergies  Codeine and Sulfa antibiotics  Home Medications   Prior to Admission medications   Medication Sig Start Date End Date Taking? Authorizing Provider  atenolol (TENORMIN) 50 MG tablet Take 100 mg by mouth daily.     Historical Provider, MD  Cyanocobalamin (VITAMIN B-12 PO) Take 1 tablet by mouth daily.    Historical Provider, MD  PARoxetine (PAXIL) 20 MG tablet Take 20 mg by mouth 2 (two) times daily.    Historical Provider, MD  VITAMIN D, ERGOCALCIFEROL, PO Take 1 tablet by mouth daily.    Historical Provider, MD   BP 141/93 mmHg  Pulse 80  Temp(Src) 98 F (36.7 C) (Oral)  Resp 16  Ht 5\' 6"  (1.676 m)  Wt 162 lb (73.483 kg)  BMI 26.16 kg/m2  SpO2 95%  LMP 08/26/2011 Physical Exam  Constitutional: She is oriented to person, place, and time. She appears well-developed and well-nourished. No distress.  HENT:  Head: Normocephalic.  Large hematoma right parietal scalp, small laceration noted  Eyes: EOM are normal.  Cardiovascular: Normal rate, regular rhythm and normal heart sounds.   Pulmonary/Chest: Effort normal and breath sounds normal.  Abdominal: Soft. She exhibits no distension. There is no tenderness.  Musculoskeletal: Normal range of motion.  Right arm: good grip strength. Radial pulses intact; tenderness over the right distal clavicle with no obvious deformity No cervical spine tenderness; immobilized in C-Collar Full ROM of bilateral hips, knees, elbows and wrists  Neurological: She is alert and oriented to person, place, and time.  Skin: Skin is warm and dry.  Psychiatric: She has a normal mood and affect. Judgment normal.  Nursing note and vitals reviewed.   ED Course  .Marland KitchenLaceration Repair Performed by: Jola Schmidt Authorized by: Jola Schmidt    ++++++++++++++++++++++++++++++++++++++++++++++++++++++++++  LACERATION REPAIR Performed by: Hoy Morn Consent: Verbal consent obtained. Risks and benefits: risks, benefits and alternatives were discussed Patient identity confirmed: provided demographic data Time out performed prior to procedure Prepped and Draped in normal sterile fashion Wound explored Laceration Location: right parietal scalp Laceration Length: 1.5 cm No Foreign Bodies seen or palpated Anesthesia: None  Amount of cleaning: standard Skin closure: Staples  Number of sutures or staples: 2  Technique: Staples  Patient tolerance: Patient tolerated the procedure well with no immediate complications.  +++++++++++++++++++++++++++++++++++++++++++++++++++++++++++++++  DIAGNOSTIC STUDIES: Oxygen Saturation is 95% on RA, adequate by my interpretation.    COORDINATION OF CARE: 1:50 AM-Discussed treatment plan which includes x-ray and CT scan with pt at bedside and pt agreed to plan.    Labs Review Labs Reviewed - No data to display  Imaging Review Dg Shoulder Right  02/28/2016  CLINICAL DATA:  Fall down steps this morning, probably landing on shoulder. Anterior shoulder pain and limited range of motion. EXAM: RIGHT SHOULDER - 2+ VIEW COMPARISON:  None. FINDINGS: Displaced oblique fracture of the right distal clavicle extending to the acromioclavicular joint. There is 5 mm osseous distraction. Proximal no coracoclavicular joint space widening, with respect to the inferior/ distal fragment. Glenohumeral alignment is maintained. No additional acute fracture. IMPRESSION: Displaced oblique right distal clavicle fracture extending to the acromioclavicular joint. Electronically Signed   By: Jeb Levering M.D.   On: 02/28/2016 01:47   Ct Head Wo Contrast  02/28/2016  CLINICAL DATA:  Fall down stairs tonight striking head on floor. Laceration and hematoma. No loss of consciousness. EXAM: CT HEAD WITHOUT CONTRAST  CT CERVICAL SPINE WITHOUT CONTRAST TECHNIQUE: Multidetector CT imaging of the head and cervical spine was performed following the standard protocol without intravenous contrast. Multiplanar CT image reconstructions of the cervical spine were also generated. COMPARISON:  Head CT 06/15/2014 FINDINGS: CT HEAD FINDINGS No intracranial hemorrhage, mass effect, or midline shift. No hydrocephalus. The basilar cisterns are patent. No evidence of territorial infarct. No intracranial fluid collection. Right temporoparietal scalp hematoma without subjacent fracture. Calvarium is intact. Included paranasal sinuses and mastoid air cells are well aerated. CT CERVICAL SPINE FINDINGS No fracture or acute subluxation. The dens is intact. There are no jumped or perched facets. Vertebral body heights are maintained. Mild disc space narrowing at C5-C6. No prevertebral soft tissue edema. IMPRESSION: 1. Right temporoparietal scalp hematoma without fracture or acute intracranial abnormality. 2. No fracture or subluxation of the cervical spine. Electronically Signed   By: Jeb Levering M.D.   On: 02/28/2016 03:07   Ct Cervical Spine Wo Contrast  02/28/2016  CLINICAL DATA:  Fall down stairs tonight striking head on floor. Laceration and hematoma. No loss of consciousness. EXAM: CT HEAD WITHOUT CONTRAST CT CERVICAL SPINE WITHOUT  CONTRAST TECHNIQUE: Multidetector CT imaging of the head and cervical spine was performed following the standard protocol without intravenous contrast. Multiplanar CT image reconstructions of the cervical spine were also generated. COMPARISON:  Head CT 06/15/2014 FINDINGS: CT HEAD FINDINGS No intracranial hemorrhage, mass effect, or midline shift. No hydrocephalus. The basilar cisterns are patent. No evidence of territorial infarct. No intracranial fluid collection. Right temporoparietal scalp hematoma without subjacent fracture. Calvarium is intact. Included paranasal sinuses and mastoid air cells are well  aerated. CT CERVICAL SPINE FINDINGS No fracture or acute subluxation. The dens is intact. There are no jumped or perched facets. Vertebral body heights are maintained. Mild disc space narrowing at C5-C6. No prevertebral soft tissue edema. IMPRESSION: 1. Right temporoparietal scalp hematoma without fracture or acute intracranial abnormality. 2. No fracture or subluxation of the cervical spine. Electronically Signed   By: Jeb Levering M.D.   On: 02/28/2016 03:07   I have personally reviewed and evaluated these images and lab results as part of my medical decision-making.   EKG Interpretation None      MDM   Final diagnoses:  None    4:59 AM Patient feels better at this time.  Right clavicle fracture placed in a shoulder immobilizer.  Orthopedic follow-up.  Scalp laceration repaired.  Infection warnings given.  CT head C-spine without acute pathology.  Full range of motion of bilateral hips   I personally performed the services described in this documentation, which was scribed in my presence. The recorded information has been reviewed and is accurate.       Jola Schmidt, MD 02/28/16 [phone]

## 2016-02-28 NOTE — Discharge Instructions (Signed)
Clavicle Fracture The clavicle, also called the collarbone, is the long bone that connects your shoulder to your rib cage. You can feel your collarbone at the top of your shoulders and rib cage. A clavicle fracture is a broken clavicle. It is a common injury that can happen at any age.  CAUSES Common causes of a clavicle fracture include:  A direct blow to your shoulder.  A car accident.  A fall, especially if you try to break your fall with an outstretched arm. RISK FACTORS You may be at increased risk if:  You are younger than 25 years or older than 53 years. Most clavicle fractures happen to people who are younger than 25 years.  You are a female.  You play contact sports. SIGNS AND SYMPTOMS A fractured clavicle is painful. It also makes it hard to move your arm. Other signs and symptoms may include:  A shoulder that drops downward and forward.  Pain when trying to lift your shoulder.  Bruising, swelling, and tenderness over your clavicle.  A grinding noise when you try to move your shoulder.  A bump over your clavicle. DIAGNOSIS Your health care provider can usually diagnose a clavicle fracture by asking about your injury and examining your shoulder and clavicle. He or she may take an X-ray to determine the position of your clavicle. TREATMENT Treatment depends on the position of your clavicle after the fracture:  If the broken ends of the bone are not out of place, your health care provider may put your arm in a sling or wrap a support bandage around your chest (figure-of-eight wrap).  If the broken ends of the bone are out of place, you may need surgery. Surgery may involve placing screws, pins, or plates to keep your clavicle stable while it heals. Healing may take about 3 months. When your health care provider thinks your fracture has healed enough, you may have to do physical therapy to regain normal movement and build up your arm strength. HOME CARE INSTRUCTIONS    Apply ice to the injured area:  Put ice in a plastic bag.  Place a towel between your skin and the bag.  Leave the ice on for 20 minutes, 2-3 times a day.  If you have a wrap or splint:  Wear it all the time, and remove it only to take a bath or shower.  When you bathe or shower, keep your shoulder in the same position as when the sling or wrap is on.  Do not lift your arm.  If you have a figure-of-eight wrap:  Another person must tighten it every day.  It should be tight enough to hold your shoulders back.  Allow enough room to place your index finger between your body and the strap.  Loosen the wrap immediately if you feel numbness or tingling in your hands.  Only take medicines as directed by your health care provider.  Avoid activities that make the injury or pain worse for 4-6 weeks after surgery.  Keep all follow-up appointments. SEEK MEDICAL CARE IF:  Your medicine is not helping to relieve pain and swelling. SEEK IMMEDIATE MEDICAL CARE IF:  Your arm is numb, cold, or pale, even when the splint is loose. MAKE SURE YOU:   Understand these instructions.  Will watch your condition.  Will get help right away if you are not doing well or get worse.   This information is not intended to replace advice given to you by your health care provider.  Make sure you discuss any questions you have with your health care provider.   Document Released: 06/24/2005 Document Revised: 09/19/2013 Document Reviewed: 08/07/2013 Elsevier Interactive Patient Education 2016 [place], Adult A laceration is a cut that goes through all of the layers of the skin and into the tissue that is right under the skin. Some lacerations heal on their own. Others need to be closed with stitches (sutures), staples, skin adhesive strips, or skin glue. Proper laceration care minimizes the risk of infection and helps the laceration to heal better. HOW TO CARE FOR YOUR  LACERATION If sutures or staples were used:  Keep the wound clean and dry.  If you were given a bandage (dressing), you should change it at least one time per day or as told by your health care provider. You should also change it if it becomes wet or dirty.  Keep the wound completely dry for the first 24 hours or as told by your health care provider. After that time, you may shower or bathe. However, make sure that the wound is not soaked in water until after the sutures or staples have been removed.  Clean the wound one time each day or as told by your health care provider:  Wash the wound with soap and water.  Rinse the wound with water to remove all soap.  Pat the wound dry with a clean towel. Do not rub the wound.  After cleaning the wound, apply a thin layer of antibiotic ointmentas told by your health care provider. This will help to prevent infection and keep the dressing from sticking to the wound.  Have the sutures or staples removed as told by your health care provider. If skin adhesive strips were used:  Keep the wound clean and dry.  If you were given a bandage (dressing), you should change it at least one time per day or as told by your health care provider. You should also change it if it becomes dirty or wet.  Do not get the skin adhesive strips wet. You may shower or bathe, but be careful to keep the wound dry.  If the wound gets wet, pat it dry with a clean towel. Do not rub the wound.  Skin adhesive strips fall off on their own. You may trim the strips as the wound heals. Do not remove skin adhesive strips that are still stuck to the wound. They will fall off in time. If skin glue was used:  Try to keep the wound dry, but you may briefly wet it in the shower or bath. Do not soak the wound in water, such as by swimming.  After you have showered or bathed, gently pat the wound dry with a clean towel. Do not rub the wound.  Do not do any activities that will make  you sweat heavily until the skin glue has fallen off on its own.  Do not apply liquid, cream, or ointment medicine to the wound while the skin glue is in place. Using those may loosen the film before the wound has healed.  If you were given a bandage (dressing), you should change it at least one time per day or as told by your health care provider. You should also change it if it becomes dirty or wet.  If a dressing is placed over the wound, be careful not to apply tape directly over the skin glue. Doing that may cause the glue to be pulled off before the wound has  healed.  Do not pick at the glue. The skin glue usually remains in place for 5-10 days, then it falls off of the skin. General Instructions  Take over-the-counter and prescription medicines only as told by your health care provider.  If you were prescribed an antibiotic medicine or ointment, take or apply it as told by your doctor. Do not stop using it even if your condition improves.  To help prevent scarring, make sure to cover your wound with sunscreen whenever you are outside after stitches are removed, after adhesive strips are removed, or when glue remains in place and the wound is healed. Make sure to wear a sunscreen of at least 30 SPF.  Do not scratch or pick at the wound.  Keep all follow-up visits as told by your health care provider. This is important.  Check your wound every day for signs of infection. Watch for:  Redness, swelling, or pain.  Fluid, blood, or pus.  Raise (elevate) the injured area above the level of your heart while you are sitting or lying down, if possible. SEEK MEDICAL CARE IF:  You received a tetanus shot and you have swelling, severe pain, redness, or bleeding at the injection site.  You have a fever.  A wound that was closed breaks open.  You notice a bad smell coming from your wound or your dressing.  You notice something coming out of the wound, such as wood or glass.  Your pain  is not controlled with medicine.  You have increased redness, swelling, or pain at the site of your wound.  You have fluid, blood, or pus coming from your wound.  You notice a change in the color of your skin near your wound.  You need to change the dressing frequently due to fluid, blood, or pus draining from the wound.  You develop a new rash.  You develop numbness around the wound. SEEK IMMEDIATE MEDICAL CARE IF:  You develop severe swelling around the wound.  Your pain suddenly increases and is severe.  You develop painful lumps near the wound or on skin that is anywhere on your body.  You have a red streak going away from your wound.  The wound is on your hand or foot and you cannot properly move a finger or toe.  The wound is on your hand or foot and you notice that your fingers or toes look pale or bluish.   This information is not intended to replace advice given to you by your health care provider. Make sure you discuss any questions you have with your health care provider.   Document Released: 09/14/2005 Document Revised: 01/29/2015 Document Reviewed: 09/10/2014 Elsevier Interactive Patient Education [employer].

## 2016-02-28 NOTE — ED Notes (Signed)
Pt arrives to the ER via EMS s/p fall; pt has been drinking ETOH this evening; pt was walking down the stairs and slipped down 2-3 stairs landing on the tile floor below; pt denies LOC; pt struck rt side of head; pt with laceration to rt side of head; pt with abrasions to left forearm; pt c/o rt shoulder pain worse with movement; pt was given 133mcg Fentanyl and 4 mg Zofran en route via EMS

## 2016-02-28 NOTE — ED Notes (Signed)
Bed: RESA Expected date:  Expected time:  Means of arrival:  Comments: EMS fell down stairs/shoulder deformity/hit head

## 2016-03-04 ENCOUNTER — Encounter (HOSPITAL_BASED_OUTPATIENT_CLINIC_OR_DEPARTMENT_OTHER): Payer: Self-pay | Admitting: *Deleted

## 2016-03-04 ENCOUNTER — Other Ambulatory Visit: Payer: Self-pay | Admitting: Orthopedic Surgery

## 2016-03-04 NOTE — Anesthesia Preprocedure Evaluation (Addendum)
Anesthesia Evaluation  Patient identified by MRN, date of birth, ID band Patient awake    Reviewed: Allergy & Precautions, NPO status , Patient's Chart, lab work & pertinent test results, reviewed documented beta blocker date and time   History of Anesthesia Complications Negative for: history of anesthetic complications  Airway Mallampati: II  TM Distance: >3 FB Neck ROM: Full    Dental no notable dental hx. (+) Dental Advisory Given   Pulmonary Current Smoker,    Pulmonary exam normal        Cardiovascular hypertension, Pt. on medications and Pt. on home beta blockers Normal cardiovascular exam     Neuro/Psych PSYCHIATRIC DISORDERS Anxiety Depression negative neurological ROS     GI/Hepatic negative GI ROS, Neg liver ROS,   Endo/Other  negative endocrine ROS  Renal/GU negative Renal ROS     Musculoskeletal   Abdominal   Peds  Hematology   Anesthesia Other Findings   Reproductive/Obstetrics                           Anesthesia Physical Anesthesia Plan  ASA: II  Anesthesia Plan: General   Post-op Pain Management: GA combined w/ Regional for post-op pain   Induction: Intravenous  Airway Management Planned: Oral ETT  Additional Equipment:   Intra-op Plan:   Post-operative Plan: Extubation in OR  Informed Consent: I have reviewed the patients History and Physical, chart, labs and discussed the procedure including the risks, benefits and alternatives for the proposed anesthesia with the patient or authorized representative who has indicated his/her understanding and acceptance.   Dental advisory given  Plan Discussed with: CRNA, Anesthesiologist and Surgeon  Anesthesia Plan Comments:       Anesthesia Quick Evaluation

## 2016-03-05 ENCOUNTER — Ambulatory Visit (HOSPITAL_COMMUNITY): Payer: BLUE CROSS/BLUE SHIELD

## 2016-03-05 ENCOUNTER — Ambulatory Visit (HOSPITAL_BASED_OUTPATIENT_CLINIC_OR_DEPARTMENT_OTHER): Payer: BLUE CROSS/BLUE SHIELD | Admitting: Anesthesiology

## 2016-03-05 ENCOUNTER — Encounter (HOSPITAL_BASED_OUTPATIENT_CLINIC_OR_DEPARTMENT_OTHER): Payer: Self-pay | Admitting: *Deleted

## 2016-03-05 ENCOUNTER — Encounter (HOSPITAL_BASED_OUTPATIENT_CLINIC_OR_DEPARTMENT_OTHER)
Admission: RE | Disposition: A | Discharge status: Home / Self Care | Payer: Self-pay | Source: Ambulatory Visit | Attending: Orthopedic Surgery

## 2016-03-05 ENCOUNTER — Ambulatory Visit (HOSPITAL_BASED_OUTPATIENT_CLINIC_OR_DEPARTMENT_OTHER)
Admission: RE | Admit: 2016-03-05 | Discharge: 2016-03-05 | Disposition: A | Discharge status: Home / Self Care | Payer: BLUE CROSS/BLUE SHIELD | Source: Ambulatory Visit | Attending: Orthopedic Surgery | Admitting: Orthopedic Surgery

## 2016-03-05 DIAGNOSIS — W109XXA Fall (on) (from) unspecified stairs and steps, initial encounter: Secondary | ICD-10-CM | POA: Diagnosis not present

## 2016-03-05 DIAGNOSIS — I10 Essential (primary) hypertension: Secondary | ICD-10-CM | POA: Diagnosis not present

## 2016-03-05 DIAGNOSIS — F17210 Nicotine dependence, cigarettes, uncomplicated: Secondary | ICD-10-CM | POA: Diagnosis not present

## 2016-03-05 DIAGNOSIS — F329 Major depressive disorder, single episode, unspecified: Secondary | ICD-10-CM | POA: Insufficient documentation

## 2016-03-05 DIAGNOSIS — Z419 Encounter for procedure for purposes other than remedying health state, unspecified: Secondary | ICD-10-CM

## 2016-03-05 DIAGNOSIS — S42031A Displaced fracture of lateral end of right clavicle, initial encounter for closed fracture: Secondary | ICD-10-CM | POA: Diagnosis not present

## 2016-03-05 DIAGNOSIS — Z79899 Other long term (current) drug therapy: Secondary | ICD-10-CM | POA: Diagnosis not present

## 2016-03-05 HISTORY — DX: Fracture of unspecified part of unspecified clavicle, initial encounter for closed fracture: S42.009A

## 2016-03-05 HISTORY — PX: ORIF CLAVICULAR FRACTURE: SHX5055

## 2016-03-05 IMAGING — RF DG C-ARM 61-120 MIN
1 series · 1 of 1 positions shown · non-contrast
Comparison: [DATE]

CLINICAL DATA: Open reduction internal fixation right clavicle

EXAM:
DG C-ARM 61-120 MIN; RIGHT CLAVICLE - 2+ VIEWS

[Series 1: run · 1 of 1 slices shown]
[im 1/1]
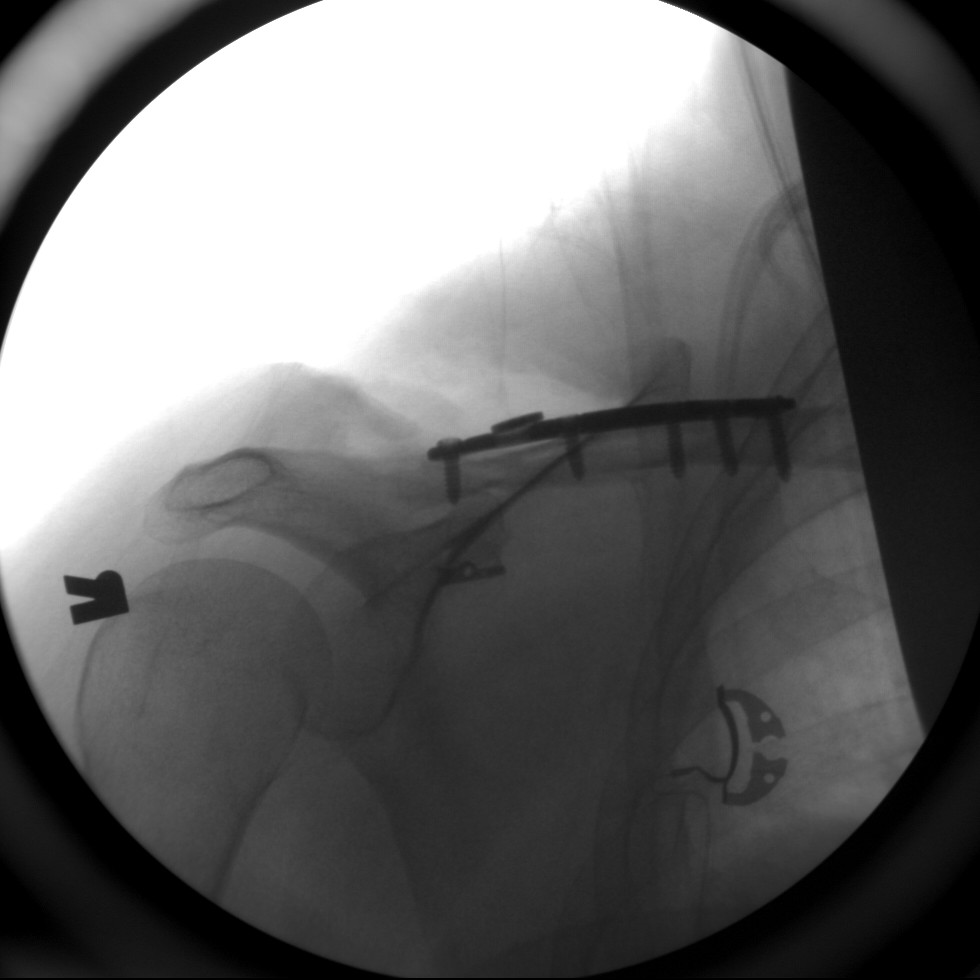

[1 of 1 positions shown; findings below may reference images not displayed]

FINDINGS: The patient is status post open reduction internal fixation of right
clavicle fracture. Metallic fixation screws and metallic plate noted
in right clavicle. There is anatomic alignment.
IMPRESSION: Metallic fixation plate and screws in right clavicle. There is
anatomic alignment. Fluoroscopy time was 14 seconds. Please see the
operative report.

## 2016-03-05 SURGERY — OPEN REDUCTION INTERNAL FIXATION (ORIF) CLAVICULAR FRACTURE
Anesthesia: General | Site: Shoulder | Laterality: Right

## 2016-03-05 MED ORDER — PHENYLEPHRINE 40 MCG/ML (10ML) SYRINGE FOR IV PUSH (FOR BLOOD PRESSURE SUPPORT)
PREFILLED_SYRINGE | INTRAVENOUS | Status: AC
  Filled 2016-03-05: qty 10

## 2016-03-05 MED ORDER — LACTATED RINGERS IV SOLN
INTRAVENOUS | Status: DC
  Administered 2016-03-05 (×3): via INTRAVENOUS

## 2016-03-05 MED ORDER — SCOPOLAMINE 1 MG/3DAYS TD PT72: 1.0000 | MEDICATED_PATCH | TRANSDERMAL | Status: DC

## 2016-03-05 MED ORDER — DOCUSATE SODIUM 100 MG PO CAPS: 100.0000 mg | ORAL_CAPSULE | Freq: Three times a day (TID) | ORAL | Status: DC | PRN

## 2016-03-05 MED ORDER — FENTANYL CITRATE (PF) 100 MCG/2ML IJ SOLN
INTRAMUSCULAR | Status: AC
  Filled 2016-03-05: qty 2

## 2016-03-05 MED ORDER — SCOPOLAMINE 1 MG/3DAYS TD PT72
1.0000 | MEDICATED_PATCH | Freq: Once | TRANSDERMAL | Status: AC | PRN
  Administered 2016-03-05: 1 via TRANSDERMAL

## 2016-03-05 MED ORDER — ONDANSETRON HCL 4 MG/2ML IJ SOLN
INTRAMUSCULAR | Status: DC | PRN
  Administered 2016-03-05: 4 mg via INTRAVENOUS

## 2016-03-05 MED ORDER — DEXAMETHASONE SODIUM PHOSPHATE 4 MG/ML IJ SOLN
INTRAMUSCULAR | Status: DC | PRN
  Administered 2016-03-05: 10 mg via INTRAVENOUS

## 2016-03-05 MED ORDER — FENTANYL CITRATE (PF) 100 MCG/2ML IJ SOLN
50.0000 ug | INTRAMUSCULAR | Status: DC | PRN
  Administered 2016-03-05: 100 ug via INTRAVENOUS

## 2016-03-05 MED ORDER — PHENYLEPHRINE HCL 10 MG/ML IJ SOLN
INTRAMUSCULAR | Status: AC
  Filled 2016-03-05: qty 1

## 2016-03-05 MED ORDER — ONDANSETRON HCL 4 MG/2ML IJ SOLN
INTRAMUSCULAR | Status: AC
  Filled 2016-03-05: qty 2

## 2016-03-05 MED ORDER — SUCCINYLCHOLINE CHLORIDE 200 MG/10ML IV SOSY
PREFILLED_SYRINGE | INTRAVENOUS | Status: AC
  Filled 2016-03-05: qty 10

## 2016-03-05 MED ORDER — PROMETHAZINE HCL 25 MG/ML IJ SOLN
6.2500 mg | INTRAMUSCULAR | Status: DC | PRN
Start: 2016-03-05 — End: 2016-03-05

## 2016-03-05 MED ORDER — GLYCOPYRROLATE 0.2 MG/ML IJ SOLN
0.2000 mg | Freq: Once | INTRAMUSCULAR | Status: AC | PRN
  Administered 2016-03-05: 0.2 mg via INTRAVENOUS

## 2016-03-05 MED ORDER — GLYCOPYRROLATE 0.2 MG/ML IV SOSY
PREFILLED_SYRINGE | INTRAVENOUS | Status: AC
  Filled 2016-03-05: qty 3

## 2016-03-05 MED ORDER — HYDROMORPHONE HCL 1 MG/ML IJ SOLN: 0.2500 mg | INTRAMUSCULAR | Status: DC | PRN

## 2016-03-05 MED ORDER — MIDAZOLAM HCL 5 MG/5ML IJ SOLN
INTRAMUSCULAR | Status: DC | PRN
  Administered 2016-03-05: 2 mg via INTRAVENOUS

## 2016-03-05 MED ORDER — PHENYLEPHRINE HCL 10 MG/ML IJ SOLN
INTRAMUSCULAR | Status: DC | PRN
  Administered 2016-03-05: 40 ug via INTRAVENOUS
  Administered 2016-03-05 (×2): 80 ug via INTRAVENOUS
  Administered 2016-03-05: 40 ug via INTRAVENOUS
  Administered 2016-03-05: 80 ug via INTRAVENOUS
  Administered 2016-03-05: 40 ug via INTRAVENOUS
  Administered 2016-03-05: 80 ug via INTRAVENOUS

## 2016-03-05 MED ORDER — PROPOFOL 10 MG/ML IV BOLUS
INTRAVENOUS | Status: DC | PRN
  Administered 2016-03-05: 30 mg via INTRAVENOUS
  Administered 2016-03-05: 200 mg via INTRAVENOUS
  Administered 2016-03-05: 30 mg via INTRAVENOUS
  Administered 2016-03-05: 20 mg via INTRAVENOUS
  Administered 2016-03-05: 25 mg via INTRAVENOUS
  Administered 2016-03-05: 50 mg via INTRAVENOUS

## 2016-03-05 MED ORDER — OXYCODONE-ACETAMINOPHEN 5-325 MG PO TABS: 1.0000 | ORAL_TABLET | ORAL | Status: AC | PRN

## 2016-03-05 MED ORDER — MIDAZOLAM HCL 2 MG/2ML IJ SOLN
1.0000 mg | INTRAMUSCULAR | Status: DC | PRN
  Administered 2016-03-05: 2 mg via INTRAVENOUS

## 2016-03-05 MED ORDER — EPHEDRINE 5 MG/ML INJ
INTRAVENOUS | Status: AC
  Filled 2016-03-05: qty 10

## 2016-03-05 MED ORDER — LIDOCAINE 2% (20 MG/ML) 5 ML SYRINGE
INTRAMUSCULAR | Status: AC
  Filled 2016-03-05: qty 5

## 2016-03-05 MED ORDER — LIDOCAINE HCL (CARDIAC) 20 MG/ML IV SOLN
INTRAVENOUS | Status: DC | PRN
  Administered 2016-03-05: 30 mg via INTRAVENOUS

## 2016-03-05 MED ORDER — DEXAMETHASONE SODIUM PHOSPHATE 10 MG/ML IJ SOLN
INTRAMUSCULAR | Status: AC
  Filled 2016-03-05: qty 1

## 2016-03-05 MED ORDER — BUPIVACAINE-EPINEPHRINE (PF) 0.5% -1:200000 IJ SOLN
INTRAMUSCULAR | Status: DC | PRN
  Administered 2016-03-05: 30 mL via PERINEURAL

## 2016-03-05 MED ORDER — SUCCINYLCHOLINE CHLORIDE 20 MG/ML IJ SOLN
INTRAMUSCULAR | Status: DC | PRN
  Administered 2016-03-05: 50 mg via INTRAVENOUS

## 2016-03-05 MED ORDER — DEXTROSE 5 % IV SOLN
10.0000 mg | INTRAVENOUS | Status: DC | PRN
  Administered 2016-03-05: 50 ug/min via INTRAVENOUS

## 2016-03-05 MED ORDER — CEFAZOLIN SODIUM-DEXTROSE 2-4 GM/100ML-% IV SOLN
2.0000 g | INTRAVENOUS | Status: AC
  Administered 2016-03-05: 2 g via INTRAVENOUS

## 2016-03-05 MED ORDER — EPHEDRINE SULFATE 50 MG/ML IJ SOLN
INTRAMUSCULAR | Status: DC | PRN
  Administered 2016-03-05 (×2): 10 mg via INTRAVENOUS

## 2016-03-05 MED ORDER — PROPOFOL 500 MG/50ML IV EMUL
INTRAVENOUS | Status: AC
  Filled 2016-03-05: qty 50

## 2016-03-05 MED ORDER — PROPOFOL 10 MG/ML IV BOLUS
INTRAVENOUS | Status: AC
  Filled 2016-03-05: qty 20

## 2016-03-05 MED ORDER — MIDAZOLAM HCL 2 MG/2ML IJ SOLN
INTRAMUSCULAR | Status: AC
  Filled 2016-03-05: qty 2

## 2016-03-05 MED ORDER — CEFAZOLIN SODIUM-DEXTROSE 2-4 GM/100ML-% IV SOLN
INTRAVENOUS | Status: AC
  Filled 2016-03-05: qty 100

## 2016-03-05 MED ORDER — FENTANYL CITRATE (PF) 100 MCG/2ML IJ SOLN
INTRAMUSCULAR | Status: DC | PRN
  Administered 2016-03-05: 100 ug via INTRAVENOUS

## 2016-03-05 MED ORDER — POVIDONE-IODINE 7.5 % EX SOLN: Freq: Once | CUTANEOUS | Status: DC

## 2016-03-05 SURGICAL SUPPLY — 71 items
BIT DRILL 2.3 QUICK RELEASE (BIT) IMPLANT
BIT DRILL 2.8X5 QR DISP (BIT) ×2 IMPLANT
BIT DRILL QUICK RELEASE 3.5MM (BIT) IMPLANT
BLADE CLIPPER SURG (BLADE) IMPLANT
BLADE SURG 15 STRL LF DISP TIS (BLADE) ×1 IMPLANT
BLADE SURG 15 STRL SS (BLADE) ×6
CHLORAPREP W/TINT 26ML (MISCELLANEOUS) ×3 IMPLANT
CLEANER CAUTERY TIP 5X5 PAD (MISCELLANEOUS) IMPLANT
CLOSURE WOUND 1/2 X4 (GAUZE/BANDAGES/DRESSINGS) ×1
DECANTER SPIKE VIAL GLASS SM (MISCELLANEOUS) IMPLANT
DRAPE C-ARM 42X72 X-RAY (DRAPES) ×3 IMPLANT
DRAPE IMP U-DRAPE 54X76 (DRAPES) ×3 IMPLANT
DRAPE INCISE IOBAN 66X45 STRL (DRAPES) ×3 IMPLANT
DRAPE SURG 17X23 STRL (DRAPES) ×5 IMPLANT
DRAPE U-SHAPE 47X51 STRL (DRAPES) ×3 IMPLANT
DRAPE U-SHAPE 76X120 STRL (DRAPES) ×6 IMPLANT
DRILL 2.3 QUICK RELEASE (BIT) ×3
DRILL QUICK RELEASE 3.5MM (BIT) ×3
DRSG TEGADERM 4X4.75 (GAUZE/BANDAGES/DRESSINGS) ×3 IMPLANT
ELECT REM PT RETURN 9FT ADLT (ELECTROSURGICAL) ×3
ELECTRODE REM PT RTRN 9FT ADLT (ELECTROSURGICAL) ×1 IMPLANT
GAUZE SPONGE 4X4 12PLY STRL (GAUZE/BANDAGES/DRESSINGS) ×3 IMPLANT
GLOVE BIO SURGEON STRL SZ7 (GLOVE) ×3 IMPLANT
GLOVE BIO SURGEON STRL SZ7.5 (GLOVE) ×3 IMPLANT
GLOVE BIOGEL PI IND STRL 7.0 (GLOVE) ×1 IMPLANT
GLOVE BIOGEL PI IND STRL 8 (GLOVE) ×1 IMPLANT
GLOVE BIOGEL PI INDICATOR 7.0 (GLOVE) ×6
GLOVE BIOGEL PI INDICATOR 8 (GLOVE) ×2
GLOVE ECLIPSE 6.5 STRL STRAW (GLOVE) ×2 IMPLANT
GOWN STRL REUS W/ TWL LRG LVL3 (GOWN DISPOSABLE) ×1 IMPLANT
GOWN STRL REUS W/ TWL XL LVL3 (GOWN DISPOSABLE) ×1 IMPLANT
GOWN STRL REUS W/TWL LRG LVL3 (GOWN DISPOSABLE) ×6
GOWN STRL REUS W/TWL XL LVL3 (GOWN DISPOSABLE) ×3
KIT AC JOINT DISP (KITS) ×2 IMPLANT
NS IRRIG 1000ML POUR BTL (IV SOLUTION) ×3 IMPLANT
PACK ARTHROSCOPY DSU (CUSTOM PROCEDURE TRAY) ×3 IMPLANT
PACK BASIN DAY SURGERY FS (CUSTOM PROCEDURE TRAY) ×3 IMPLANT
PAD CLEANER CAUTERY TIP 5X5 (MISCELLANEOUS) ×2
PENCIL BUTTON HOLSTER BLD 10FT (ELECTRODE) ×3 IMPLANT
PLATE 6 H SM LOCKING RT CLAV (Plate) ×2 IMPLANT
RETRIEVER SUT HEWSON (MISCELLANEOUS) ×2 IMPLANT
SCREW HEXALOBE NON-LOCK 3.5X14 (Screw) ×2 IMPLANT
SCREW NON LOCK 3.5X10MM (Screw) ×4 IMPLANT
SCREW NONLOCK HEX 3.5X12 (Screw) ×4 IMPLANT
SLEEVE SCD COMPRESS KNEE MED (MISCELLANEOUS) ×2 IMPLANT
SLING ARM FOAM STRAP LRG (SOFTGOODS) ×2 IMPLANT
SLING ARM IMMOBILIZER MED (SOFTGOODS) IMPLANT
SLING ARM MED ADULT FOAM STRAP (SOFTGOODS) IMPLANT
SLING ARM XL FOAM STRAP (SOFTGOODS) IMPLANT
SPONGE LAP 18X18 X RAY DECT (DISPOSABLE) ×3 IMPLANT
STRIP CLOSURE SKIN 1/2X4 (GAUZE/BANDAGES/DRESSINGS) ×2 IMPLANT
SUCTION FRAZIER HANDLE 10FR (MISCELLANEOUS) ×2
SUCTION TUBE FRAZIER 10FR DISP (MISCELLANEOUS) IMPLANT
SUPPORT WRAP ARM LG (MISCELLANEOUS) IMPLANT
SUT ETHILON 4 0 PS 2 18 (SUTURE) IMPLANT
SUT FIBERWIRE #2 38 T-5 BLUE (SUTURE) ×3
SUT MNCRL AB 4-0 PS2 18 (SUTURE) ×3 IMPLANT
SUT TIGER TAPE 7 IN WHITE (SUTURE) IMPLANT
SUT VIC AB 0 CT1 27 (SUTURE) ×3
SUT VIC AB 0 CT1 27XBRD ANBCTR (SUTURE) ×1 IMPLANT
SUT VIC AB 2-0 SH 27 (SUTURE) ×3
SUT VIC AB 2-0 SH 27XBRD (SUTURE) ×1 IMPLANT
SUTURE FIBERWR #2 38 T-5 BLUE (SUTURE) ×1 IMPLANT
SYR BULB 3OZ (MISCELLANEOUS) ×3 IMPLANT
TAPE FIBER 2MM 7IN #2 BLUE (SUTURE) IMPLANT
TOWEL OR 17X24 6PK STRL BLUE (TOWEL DISPOSABLE) ×3 IMPLANT
TOWEL OR NON WOVEN STRL DISP B (DISPOSABLE) ×3 IMPLANT
TUBE CONNECTING 20'X1/4 (TUBING) ×1
TUBE CONNECTING 20X1/4 (TUBING) ×2 IMPLANT
YANKAUER SUCT BULB TIP NO VENT (SUCTIONS) ×3 IMPLANT
ZIPLOOP AC JOINT REPAIR (Orthopedic Implant) ×2 IMPLANT

## 2016-03-05 NOTE — Discharge Instructions (Signed)
Discharge Instructions after Open Shoulder Repair  A sling has been provided for you. Remain in your sling at all times. This includes sleeping in your sling.  Use ice on the shoulder intermittently over the first 48 hours after surgery.  Pain medicine has been prescribed for you.  Use your medicine liberally over the first 48 hours, and then you can begin to taper your use. You may take Extra Strength Tylenol or Tylenol only in place of the pain pills. DO NOT take ANY nonsteroidal anti-inflammatory pain medications: Advil, Motrin, Ibuprofen, Aleve, Naproxen or Naprosyn.  You may remove your dressing after two days  You may shower 5 days after surgery. The incisions CANNOT get wet prior to 5 days. Simply allow the water to wash over the site and then pat dry. Do not rub the incisions. Make sure your axilla (armpit) is completely dry after showering.  Take one aspirin, a day for 2 weeks after surgery, unless you have an aspirin sensitivity/ allergy or asthma.   Please call [phone] during normal business hours or [phone] after hours for any problems. Including the following:  - excessive redness of the incisions - drainage for more than 4 days - fever of more than 101.5 F  *Please note that pain medications will not be refilled after hours or on weekends.  Regional Anesthesia Blocks  1. Numbness or the inability to move the "blocked" extremity may last from 3-48 hours after placement. The length of time depends on the medication injected and your individual response to the medication. If the numbness is not going away after 48 hours, call your surgeon.  2. The extremity that is blocked will need to be protected until the numbness is gone and the  Strength has returned. Because you cannot feel it, you will need to take extra care to avoid injury. Because it may be weak, you may have difficulty moving it or using it. You may not know what position it is in without looking at it while the  block is in effect.  3. For blocks in the legs and feet, returning to weight bearing and walking needs to be done carefully. You will need to wait until the numbness is entirely gone and the strength has returned. You should be able to move your leg and foot normally before you try and bear weight or walk. You will need someone to be with you when you first try to ensure you do not fall and possibly risk injury.  4. Bruising and tenderness at the needle site are common side effects and will resolve in a few days.  5. Persistent numbness or new problems with movement should be communicated to the surgeon or the [place] [phone] [place] [phone]).    Post Anesthesia Home Care Instructions  Activity: Get plenty of rest for the remainder of the day. A responsible adult should stay with you for 24 hours following the procedure.  For the next 24 hours, DO NOT: -Drive a car -Paediatric nurse -Drink alcoholic beverages -Take any medication unless instructed by your physician -Make any legal decisions or sign important papers.  Meals: Start with liquid foods such as gelatin or soup. Progress to regular foods as tolerated. Avoid greasy, spicy, heavy foods. If nausea and/or vomiting occur, drink only clear liquids until the nausea and/or vomiting subsides. Call your physician if vomiting continues.  Special Instructions/Symptoms: Your throat may feel dry or sore from the anesthesia or the breathing tube placed  in your throat during surgery. If this causes discomfort, gargle with warm salt water. The discomfort should disappear within 24 hours.  If you had a scopolamine patch placed behind your ear for the management of post- operative nausea and/or vomiting:  1. The medication in the patch is effective for 72 hours, after which it should be removed.  Wrap patch in a tissue and discard in the trash. Wash hands thoroughly with soap and water. 2. You may  remove the patch earlier than 72 hours if you experience unpleasant side effects which may include dry mouth, dizziness or visual disturbances. 3. Avoid touching the patch. Wash your hands with soap and water after contact with the patch.

## 2016-03-05 NOTE — Transfer of Care (Signed)
Immediate Anesthesia Transfer of Care Note  Patient: Lynnet [place]  Procedure(s) Performed: Procedure(s): Right open reduction internal fixation clavical fracture  (Right)  Patient Location: PACU  Anesthesia Type:GA combined with regional for post-op pain  Level of Consciousness: awake and patient cooperative  Airway & Oxygen Therapy: Patient Spontanous Breathing and Patient connected to face mask oxygen  Post-op Assessment: Report given to RN and Post -op Vital signs reviewed and stable  Post vital signs: Reviewed and stable  Last Vitals:  Filed Vitals:   03/05/16 0710 03/05/16 0715  BP: 115/75 138/83  Pulse: 62 60  Temp:    Resp: 19 15    Last Pain:  Filed Vitals:   03/05/16 0717  PainSc: 2          Complications: No apparent anesthesia complications

## 2016-03-05 NOTE — Op Note (Signed)
Procedure(s): Right open reduction internal fixation clavical fracture  Procedure Note  Tara [name] female 50 y.o. 03/05/2016  Procedure(s) and Anesthesia Type:    * Right open reduction internal fixation clavical fracture with coracoclavicular ligament augmentation   Surgeon(s) and Role:    * Tania Ade, MD - Primary   Indications:  50 y.o. female s/p fall down the stairs with right clavicle fracture. Indicated for surgery to promote anatomic restoration anatomy, improve functional outcome and avoid skin complications.     Surgeon: Nita Sells   Assistants: Jeanmarie Hubert PA-C [facility] was present and scrubbed throughout the procedure and was essential in positioning, retraction, exposure, and closure)  Anesthesia: General endotracheal anesthesia with preoperative interscalene block given by the attending anesthesiologist    Procedure Detail  Right open reduction internal fixation clavical fracture   Findings: Comminuted fracture with a significant longitudinal axial component with separation of the inferior fragment still attached to the coracoclavicular ligaments in a separate distal fragment into the acromioclavicular joint. The distal fragment was excised and the superior fragment was repaired to the inferior fragment with combination of plate, interfragmentary screws, and an augmentation of the coracoid clavicular ligaments with a [employer] zipped loop.  Estimated Blood Loss:  less than 50 mL         Drains: none  Blood Given: none         Specimens: none        Complications:  * No complications entered in OR log *         Disposition: PACU - hemodynamically stable.         Condition: stable    Procedure:   DESCRIPTION OF PROCEDURE: The patient was identified in preoperative  holding area where I personally marked the operative site after  verifying site, side, and procedure with the patient. The patient was taken back  to the operating  room where general anesthesia was induced without  complication and was placed in the beach-chair position with the back  elevated about 40 degrees and all extremities carefully padded and  positioned. The neck was turned very slightly away from the operative field  to assist in exposure. The right upper extremity was then prepped and  draped in a standard sterile fashion. The appropriate time-out  procedure was carried out. The patient did receive IV antibiotics  within 30 minutes of incision.  An incision was made in [employer] centered over the fracture site. Dissection was carried down through subcutaneous tissues and medial and lateral skin flaps were elevated.  The deltotrapezial fascia was then opened over the clavicle and the  medial and lateral fracture fragments were carefully exposed, taking great care to protect underlying neurovascular structures.  Fracture extended to the acromioclavicular joint superiorly. There is a long horizontal split separating a large inferior fragment still attached to coracoclavicular ligaments from the superior fragment. There is a separate fragment involving the acromioclavicular joint which was very small, approximately 1 cm in length. After assessing the fracture I did feel that the most distal fragment would not adequately hold any fixation and was likely to cause future problems with either nonunion of this site or acromioclavicular joint dysfunction and therefore I went ahead with an excision of this fragment excising the most distal aspect of the clavicle, essentially doing a Mumford procedure. The remaining fracture was purely horizontal in nature. Therefore after assessing the fracture felt the appropriate fixation would be with lag screw fixation through a plate augmented with a zipped loop  button device to prevent superior displacement. The superior aspect of the coracoid was identified and exposed with small retractors medial and lateral. The pin was  advanced through the central aspect of the coracoid and then overdrilled with a 4.5 mm reamer. The button device was advanced to the inferior aspect of the coracoid and flipped. Excellent fixation was noted. Sutures were then passed up first 3 inferior fragment through drill hole and then through the superior fragment at a point winding up with the second hole on the plate. Once all the sutures were passed the plate was positioned and the fracture was reduced with reduction clamps. Once everything was held in position the 2 distal screws surrounding the button device were drilled measured and filled with the appropriate size 3.5 screws in a lag fashion to compress the fracture and nonlocking screws were placed medially to secure the plate. The button device was then tensioned. Final fluoroscopic imaging showed anatomic reduction with slight over reduction of the coracoclavicular distance. Plate screws are in good position.  The wound was copiously irrigated with normal saline and the deltotrapezial fascia was  then carefully closed over the construct with #0 vicryl sutures in  interrupted fashion. The skin was then closed with 2-0 Vicryl in a deep  dermal layer, 4-0 Monocryl for skin closure. Steri-Strips were applied.  Sterile dressings were applied including a medium  Mepilex dressing. The patient was then allowed to awaken from general  anesthesia, placed in a sling, transferred to stretcher and taken to the  recovery room in stable condition.   POSTOPERATIVE PLAN: She will be observed in the recovery room and if doing well with her block will be discharged home with her husband. She'll follow-up in my office in 10-14 days for wound check. Meantime she will remain in her sling other than gentle hand wrist elbow motion.

## 2016-03-05 NOTE — H&P (Signed)
Tara [name] is an 50 y.o. female.   Chief Complaint: R clavicle fracture HPI: s/p fall down stair with R displaced clavicle fracture  Past Medical History  Diagnosis Date  . Anxiety state, unspecified   . Essential hypertension, benign   . Depression   . History of pneumonia   . Clavicle fracture     right    Past Surgical History  Procedure Laterality Date  . Eye surgery      left  . Breast surgery Left     lumpectomy    Family History  Problem Relation Age of Onset  . Breast cancer Paternal Grandmother   . Breast cancer Mother   . Diabetes Mother     pre-diabetes  . Ovarian cancer Maternal Grandmother   . Prostate cancer Father   . Hypertension Father   . Stomach cancer Cousin   . Colon cancer Paternal Grandfather   . Stroke Maternal Grandfather    Social History:  reports that she has been smoking Cigarettes.  She has a 20 pack-year smoking history. She has never used smokeless tobacco. She reports that she drinks about 4.2 oz of alcohol per week. She reports that she does not use illicit drugs.  Allergies:  Allergies  Allergen Reactions  . Codeine Hives  . Sulfa Antibiotics Hives    Medications Prior to Admission  Medication Sig Dispense Refill  . atenolol (TENORMIN) 50 MG tablet Take 100 mg by mouth daily.     Marland Kitchen HYDROcodone-acetaminophen (NORCO/VICODIN) 5-325 MG tablet Take 1 tablet by mouth every 4 (four) hours as needed for moderate pain. 15 tablet 0  . naproxen sodium (ANAPROX) 220 MG tablet Take 440 mg by mouth every 12 (twelve) hours as needed (pain).    Marland Kitchen PARoxetine (PAXIL) 20 MG tablet Take 20 mg by mouth daily.       No results found for this or any previous visit (from the past 48 hour(s)). No results found.  Review of Systems  All other systems reviewed and are negative.   Blood pressure 138/83, pulse 60, temperature 97.8 F (36.6 C), temperature source Oral, resp. rate 15, height 5\' 5"  (1.651 m), weight 78.472 kg (173 lb), last menstrual  period 08/26/2011, SpO2 100 %. Physical Exam  Constitutional: She is oriented to person, place, and time. She appears well-developed and well-nourished.  HENT:  Head: Atraumatic.  Eyes: EOM are normal.  Cardiovascular: Intact distal pulses.   Respiratory: Effort normal.  Musculoskeletal:  R shoulder bruised and swollen, distally NVI  Neurological: She is alert and oriented to person, place, and time.  Skin: Skin is warm and dry.  Psychiatric: She has a normal mood and affect.     Assessment/Plan R displaced clavicle fracture Plan ORIF Risks / benefits of surgery discussed Consent on chart  NPO for OR Preop antibiotics   Nita Sells, MD 03/05/2016, 7:23 AM

## 2016-03-05 NOTE — Progress Notes (Signed)
Assisted Dr. Singer with right, ultrasound guided, interscalene  block. Side rails up, monitors on throughout procedure. See vital signs in flow sheet. Tolerated Procedure well. 

## 2016-03-05 NOTE — Anesthesia Procedure Notes (Addendum)
Anesthesia Regional Block:  Interscalene brachial plexus block  Pre-Anesthetic Checklist: ,, timeout performed, Correct Patient, Correct Site, Correct Laterality, Correct Procedure,, site marked, risks and benefits discussed, Surgical consent,  Pre-op evaluation,  At surgeon's request and post-op pain management  Laterality: Right  Prep: chloraprep       Needles:  Injection technique: Single-shot  Needle Type: Echogenic Stimulator Needle     Needle Length: 5cm 5 cm Needle Gauge: 22 and 22 G    Additional Needles:  Procedures: ultrasound guided (picture in chart) and nerve stimulator Interscalene brachial plexus block  Nerve Stimulator or Paresthesia:  Response: bicep contraction, 0.48 mA,   Additional Responses:   Narrative:  Start time: 03/05/2016 7:10 AM End time: 03/05/2016 7:20 AM Injection made incrementally with aspirations every 5 mL.  Performed by: Personally   Additional Notes: Functioning IV was confirmed and monitors applied.  A 38mm 22ga echogenic arrow stimulator was used. Sterile prep and drape,hand hygiene and sterile gloves were used.Ultrasound guidance: relevent anatomy identified, needle position confirmed, local anesthetic spread visualized around nerve(s)., vascular puncture avoided.  Image printed for medical record.  Negative aspiration and negative test dose prior to incremental administration of local anesthetic. The patient tolerated the procedure well.   Procedure Name: Intubation Date/Time: 03/05/2016 7:32 AM Performed by: Marrianne Mood Pre-anesthesia Checklist: Patient identified, Emergency Drugs available, Suction available, Patient being monitored and Timeout performed Patient Re-evaluated:Patient Re-evaluated prior to inductionOxygen Delivery Method: Circle system utilized Preoxygenation: Pre-oxygenation with 100% oxygen Intubation Type: IV induction Ventilation: Mask ventilation without difficulty Laryngoscope Size: Miller and 3 Grade  View: Grade III Tube type: Oral Tube size: 7.0 mm Number of attempts: 1 Airway Equipment and Method: Stylet and Oral airway Placement Confirmation: ETT inserted through vocal cords under direct vision,  positive ETCO2 and breath sounds checked- equal and bilateral Secured at: 20 cm Tube secured with: Tape Dental Injury: Teeth and Oropharynx as per pre-operative assessment

## 2016-03-05 NOTE — Anesthesia Postprocedure Evaluation (Signed)
Anesthesia Post Note  Patient: Tara [name]  Procedure(s) Performed: Procedure(s) (LRB): Right open reduction internal fixation clavical fracture  (Right)  Patient location during evaluation: PACU Anesthesia Type: General Level of consciousness: sedated Pain management: pain level controlled Vital Signs Assessment: post-procedure vital signs reviewed and stable Respiratory status: spontaneous breathing and respiratory function stable Cardiovascular status: stable Anesthetic complications: no    Last Vitals:  Filed Vitals:   03/05/16 1200 03/05/16 1205  BP:    Pulse: 79 74  Temp:    Resp:      Last Pain:  Filed Vitals:   03/05/16 1215  PainSc: 0-No pain                 Tyra Gural DANIEL

## 2016-03-06 ENCOUNTER — Encounter (HOSPITAL_BASED_OUTPATIENT_CLINIC_OR_DEPARTMENT_OTHER): Payer: Self-pay | Admitting: Orthopedic Surgery

## 2019-12-09 ENCOUNTER — Ambulatory Visit: Payer: BC Managed Care – PPO | Attending: Internal Medicine

## 2019-12-09 DIAGNOSIS — Z23 Encounter for immunization: Secondary | ICD-10-CM

## 2019-12-09 NOTE — Progress Notes (Signed)
   Covid-19 Vaccination Clinic  Name:  Tara [name]    MRN: [id number] DOB: 10/14/1965  12/09/2019  [name] was observed post Covid-19 immunization for 15 minutes without incident. She was provided with Vaccine Information Sheet and instruction to access the V-Safe system.   [name] was instructed to call 911 with any severe reactions post vaccine: Marland Kitchen Difficulty breathing  . Swelling of face and throat  . A fast heartbeat  . A bad rash all over body  . Dizziness and weakness   Immunizations Administered    Name Date Dose VIS Date Route   Pfizer COVID-19 Vaccine 12/09/2019  2:17 PM 0.3 mL 09/08/2019 Intramuscular   Manufacturer: [place]   Lot: [id number]   [place]: [id number]

## 2020-01-03 ENCOUNTER — Ambulatory Visit: Payer: BC Managed Care – PPO | Attending: Internal Medicine

## 2020-01-03 DIAGNOSIS — Z23 Encounter for immunization: Secondary | ICD-10-CM

## 2020-01-03 NOTE — Progress Notes (Signed)
   Covid-19 Vaccination Clinic  Name:  Tara [name]    MRN: [id number] DOB: 27-May-1966  01/03/2020  [name] was observed post Covid-19 immunization for 15 minutes without incident. She was provided with Vaccine Information Sheet and instruction to access the V-Safe system.   [name] was instructed to call 911 with any severe reactions post vaccine: Marland Kitchen Difficulty breathing  . Swelling of face and throat  . A fast heartbeat  . A bad rash all over body  . Dizziness and weakness   Immunizations Administered    Name Date Dose VIS Date Route   Pfizer COVID-19 Vaccine 01/03/2020  1:05 PM 0.3 mL 09/08/2019 Intramuscular   Manufacturer: [place]   Lot: [id number]   [place]: [id number]

## 2021-06-26 ENCOUNTER — Encounter: Payer: Self-pay | Admitting: *Deleted

## 2021-06-27 ENCOUNTER — Ambulatory Visit: Payer: BC Managed Care – PPO | Admitting: Neurology

## 2021-07-29 ENCOUNTER — Ambulatory Visit (INDEPENDENT_AMBULATORY_CARE_PROVIDER_SITE_OTHER): Payer: BC Managed Care – PPO | Admitting: Neurology

## 2021-07-29 ENCOUNTER — Emergency Department (HOSPITAL_COMMUNITY): Payer: BC Managed Care – PPO

## 2021-07-29 ENCOUNTER — Inpatient Hospital Stay (HOSPITAL_COMMUNITY): Payer: BC Managed Care – PPO

## 2021-07-29 ENCOUNTER — Inpatient Hospital Stay (HOSPITAL_COMMUNITY)
Admission: EM | Admit: 2021-07-29 | Discharge: 2021-08-28 | DRG: 308 | Disposition: E | Payer: BC Managed Care – PPO | Attending: Internal Medicine | Admitting: Internal Medicine

## 2021-07-29 ENCOUNTER — Telehealth: Payer: Self-pay | Admitting: Neurology

## 2021-07-29 ENCOUNTER — Encounter: Payer: Self-pay | Admitting: Neurology

## 2021-07-29 ENCOUNTER — Other Ambulatory Visit: Payer: Self-pay

## 2021-07-29 VITALS — BP 106/67 | HR 61 | Ht 65.0 in | Wt 139.0 lb

## 2021-07-29 DIAGNOSIS — D6959 Other secondary thrombocytopenia: Secondary | ICD-10-CM | POA: Diagnosis present

## 2021-07-29 DIAGNOSIS — Z8249 Family history of ischemic heart disease and other diseases of the circulatory system: Secondary | ICD-10-CM

## 2021-07-29 DIAGNOSIS — E871 Hypo-osmolality and hyponatremia: Secondary | ICD-10-CM | POA: Diagnosis present

## 2021-07-29 DIAGNOSIS — G959 Disease of spinal cord, unspecified: Secondary | ICD-10-CM | POA: Diagnosis present

## 2021-07-29 DIAGNOSIS — E876 Hypokalemia: Secondary | ICD-10-CM | POA: Diagnosis not present

## 2021-07-29 DIAGNOSIS — I469 Cardiac arrest, cause unspecified: Secondary | ICD-10-CM

## 2021-07-29 DIAGNOSIS — I5021 Acute systolic (congestive) heart failure: Secondary | ICD-10-CM | POA: Diagnosis not present

## 2021-07-29 DIAGNOSIS — I70203 Unspecified atherosclerosis of native arteries of extremities, bilateral legs: Secondary | ICD-10-CM | POA: Diagnosis present

## 2021-07-29 DIAGNOSIS — I255 Ischemic cardiomyopathy: Secondary | ICD-10-CM | POA: Diagnosis present

## 2021-07-29 DIAGNOSIS — M96A4 Flail chest associated with chest compression and cardiopulmonary resuscitation: Secondary | ICD-10-CM | POA: Diagnosis present

## 2021-07-29 DIAGNOSIS — I462 Cardiac arrest due to underlying cardiac condition: Secondary | ICD-10-CM | POA: Diagnosis present

## 2021-07-29 DIAGNOSIS — U071 COVID-19: Secondary | ICD-10-CM | POA: Diagnosis present

## 2021-07-29 DIAGNOSIS — Z7189 Other specified counseling: Secondary | ICD-10-CM | POA: Diagnosis not present

## 2021-07-29 DIAGNOSIS — I5023 Acute on chronic systolic (congestive) heart failure: Secondary | ICD-10-CM | POA: Diagnosis present

## 2021-07-29 DIAGNOSIS — Z823 Family history of stroke: Secondary | ICD-10-CM

## 2021-07-29 DIAGNOSIS — G928 Other toxic encephalopathy: Secondary | ICD-10-CM | POA: Diagnosis not present

## 2021-07-29 DIAGNOSIS — Z8041 Family history of malignant neoplasm of ovary: Secondary | ICD-10-CM

## 2021-07-29 DIAGNOSIS — Z452 Encounter for adjustment and management of vascular access device: Secondary | ICD-10-CM

## 2021-07-29 DIAGNOSIS — Z66 Do not resuscitate: Secondary | ICD-10-CM | POA: Diagnosis not present

## 2021-07-29 DIAGNOSIS — Z885 Allergy status to narcotic agent status: Secondary | ICD-10-CM

## 2021-07-29 DIAGNOSIS — I4901 Ventricular fibrillation: Secondary | ICD-10-CM | POA: Diagnosis present

## 2021-07-29 DIAGNOSIS — J9601 Acute respiratory failure with hypoxia: Secondary | ICD-10-CM | POA: Diagnosis not present

## 2021-07-29 DIAGNOSIS — Z803 Family history of malignant neoplasm of breast: Secondary | ICD-10-CM

## 2021-07-29 DIAGNOSIS — Z4659 Encounter for fitting and adjustment of other gastrointestinal appliance and device: Secondary | ICD-10-CM

## 2021-07-29 DIAGNOSIS — R570 Cardiogenic shock: Secondary | ICD-10-CM | POA: Diagnosis present

## 2021-07-29 DIAGNOSIS — I248 Other forms of acute ischemic heart disease: Secondary | ICD-10-CM | POA: Diagnosis present

## 2021-07-29 DIAGNOSIS — F1010 Alcohol abuse, uncomplicated: Secondary | ICD-10-CM | POA: Diagnosis present

## 2021-07-29 DIAGNOSIS — R34 Anuria and oliguria: Secondary | ICD-10-CM | POA: Diagnosis not present

## 2021-07-29 DIAGNOSIS — I639 Cerebral infarction, unspecified: Secondary | ICD-10-CM | POA: Diagnosis not present

## 2021-07-29 DIAGNOSIS — R627 Adult failure to thrive: Secondary | ICD-10-CM | POA: Diagnosis not present

## 2021-07-29 DIAGNOSIS — K7200 Acute and subacute hepatic failure without coma: Secondary | ICD-10-CM | POA: Diagnosis not present

## 2021-07-29 DIAGNOSIS — I2510 Atherosclerotic heart disease of native coronary artery without angina pectoris: Secondary | ICD-10-CM

## 2021-07-29 DIAGNOSIS — Z882 Allergy status to sulfonamides status: Secondary | ICD-10-CM

## 2021-07-29 DIAGNOSIS — E874 Mixed disorder of acid-base balance: Secondary | ICD-10-CM | POA: Diagnosis present

## 2021-07-29 DIAGNOSIS — R680 Hypothermia, not associated with low environmental temperature: Secondary | ICD-10-CM | POA: Diagnosis not present

## 2021-07-29 DIAGNOSIS — R778 Other specified abnormalities of plasma proteins: Secondary | ICD-10-CM | POA: Diagnosis not present

## 2021-07-29 DIAGNOSIS — J690 Pneumonitis due to inhalation of food and vomit: Secondary | ICD-10-CM | POA: Diagnosis not present

## 2021-07-29 DIAGNOSIS — F17210 Nicotine dependence, cigarettes, uncomplicated: Secondary | ICD-10-CM | POA: Diagnosis present

## 2021-07-29 DIAGNOSIS — D649 Anemia, unspecified: Secondary | ICD-10-CM | POA: Diagnosis present

## 2021-07-29 DIAGNOSIS — N170 Acute kidney failure with tubular necrosis: Secondary | ICD-10-CM | POA: Diagnosis present

## 2021-07-29 DIAGNOSIS — G931 Anoxic brain damage, not elsewhere classified: Secondary | ICD-10-CM | POA: Diagnosis present

## 2021-07-29 DIAGNOSIS — R230 Cyanosis: Secondary | ICD-10-CM | POA: Diagnosis not present

## 2021-07-29 DIAGNOSIS — F32A Depression, unspecified: Secondary | ICD-10-CM | POA: Diagnosis present

## 2021-07-29 DIAGNOSIS — Z515 Encounter for palliative care: Secondary | ICD-10-CM | POA: Diagnosis not present

## 2021-07-29 DIAGNOSIS — R202 Paresthesia of skin: Secondary | ICD-10-CM | POA: Diagnosis not present

## 2021-07-29 DIAGNOSIS — I2584 Coronary atherosclerosis due to calcified coronary lesion: Secondary | ICD-10-CM | POA: Diagnosis not present

## 2021-07-29 DIAGNOSIS — R269 Unspecified abnormalities of gait and mobility: Secondary | ICD-10-CM | POA: Diagnosis not present

## 2021-07-29 DIAGNOSIS — Z833 Family history of diabetes mellitus: Secondary | ICD-10-CM

## 2021-07-29 DIAGNOSIS — Z8673 Personal history of transient ischemic attack (TIA), and cerebral infarction without residual deficits: Secondary | ICD-10-CM

## 2021-07-29 DIAGNOSIS — J9690 Respiratory failure, unspecified, unspecified whether with hypoxia or hypercapnia: Secondary | ICD-10-CM | POA: Insufficient documentation

## 2021-07-29 DIAGNOSIS — F411 Generalized anxiety disorder: Secondary | ICD-10-CM | POA: Diagnosis present

## 2021-07-29 DIAGNOSIS — I110 Hypertensive heart disease with heart failure: Secondary | ICD-10-CM | POA: Diagnosis present

## 2021-07-29 DIAGNOSIS — R569 Unspecified convulsions: Secondary | ICD-10-CM | POA: Diagnosis not present

## 2021-07-29 DIAGNOSIS — J9600 Acute respiratory failure, unspecified whether with hypoxia or hypercapnia: Secondary | ICD-10-CM

## 2021-07-29 DIAGNOSIS — N179 Acute kidney failure, unspecified: Secondary | ICD-10-CM | POA: Diagnosis not present

## 2021-07-29 DIAGNOSIS — Z800 Family history of malignant neoplasm of digestive organs: Secondary | ICD-10-CM

## 2021-07-29 DIAGNOSIS — Z79899 Other long term (current) drug therapy: Secondary | ICD-10-CM

## 2021-07-29 DIAGNOSIS — Z8042 Family history of malignant neoplasm of prostate: Secondary | ICD-10-CM

## 2021-07-29 DIAGNOSIS — Z888 Allergy status to other drugs, medicaments and biological substances status: Secondary | ICD-10-CM

## 2021-07-29 DIAGNOSIS — R7989 Other specified abnormal findings of blood chemistry: Secondary | ICD-10-CM | POA: Diagnosis not present

## 2021-07-29 LAB — CBC WITH DIFFERENTIAL/PLATELET
Abs Immature Granulocytes: 0.2 10*3/uL — ABNORMAL HIGH (ref 0.00–0.07)
Band Neutrophils: 25 %
Basophils Absolute: 0 10*3/uL (ref 0.0–0.1)
Basophils Relative: 0 %
Eosinophils Absolute: 0.3 10*3/uL (ref 0.0–0.5)
Eosinophils Relative: 3 %
HCT: 34.3 % — ABNORMAL LOW (ref 36.0–46.0)
Hemoglobin: 10.8 g/dL — ABNORMAL LOW (ref 12.0–15.0)
Lymphocytes Relative: 34 %
Lymphs Abs: 3.1 10*3/uL (ref 0.7–4.0)
MCH: 33.9 pg (ref 26.0–34.0)
MCHC: 31.5 g/dL (ref 30.0–36.0)
MCV: 107.5 fL — ABNORMAL HIGH (ref 80.0–100.0)
Metamyelocytes Relative: 2 %
Monocytes Absolute: 0.4 10*3/uL (ref 0.1–1.0)
Monocytes Relative: 4 %
Neutro Abs: 5.2 10*3/uL (ref 1.7–7.7)
Neutrophils Relative %: 32 %
Platelets: 73 10*3/uL — ABNORMAL LOW (ref 150–400)
RBC: 3.19 MIL/uL — ABNORMAL LOW (ref 3.87–5.11)
RDW: 11.9 % (ref 11.5–15.5)
Smear Review: DECREASED
WBC Morphology: INCREASED
WBC: 9.2 10*3/uL (ref 4.0–10.5)
nRBC: 0.2 % (ref 0.0–0.2)
nRBC: 2 /100 WBC — ABNORMAL HIGH

## 2021-07-29 LAB — URINALYSIS, ROUTINE W REFLEX MICROSCOPIC
Bilirubin Urine: NEGATIVE
Glucose, UA: NEGATIVE mg/dL
Ketones, ur: NEGATIVE mg/dL
Leukocytes,Ua: NEGATIVE
Nitrite: NEGATIVE
Protein, ur: 100 mg/dL — AB
Specific Gravity, Urine: 1.014 (ref 1.005–1.030)
pH: 5 (ref 5.0–8.0)

## 2021-07-29 LAB — COMPREHENSIVE METABOLIC PANEL
ALT: 132 U/L — ABNORMAL HIGH (ref 0–44)
AST: 298 U/L — ABNORMAL HIGH (ref 15–41)
Albumin: 2.8 g/dL — ABNORMAL LOW (ref 3.5–5.0)
Alkaline Phosphatase: 148 U/L — ABNORMAL HIGH (ref 38–126)
Anion gap: 14 (ref 5–15)
BUN: 15 mg/dL (ref 6–20)
CO2: 14 mmol/L — ABNORMAL LOW (ref 22–32)
Calcium: 8 mg/dL — ABNORMAL LOW (ref 8.9–10.3)
Chloride: 102 mmol/L (ref 98–111)
Creatinine, Ser: 1.52 mg/dL — ABNORMAL HIGH (ref 0.44–1.00)
GFR, Estimated: 41 mL/min — ABNORMAL LOW (ref >60)
Glucose, Bld: 153 mg/dL — ABNORMAL HIGH (ref 70–99)
Potassium: 4.1 mmol/L (ref 3.5–5.1)
Sodium: 130 mmol/L — ABNORMAL LOW (ref 135–145)
Total Bilirubin: 0.4 mg/dL (ref 0.3–1.2)
Total Protein: 5.1 g/dL — ABNORMAL LOW (ref 6.5–8.1)

## 2021-07-29 LAB — I-STAT ARTERIAL BLOOD GAS, ED
Acid-base deficit: 13 mmol/L — ABNORMAL HIGH (ref 0.0–2.0)
Bicarbonate: 13.6 mmol/L — ABNORMAL LOW (ref 20.0–28.0)
Calcium, Ion: 1.13 mmol/L — ABNORMAL LOW (ref 1.15–1.40)
HCT: 34 % — ABNORMAL LOW (ref 36.0–46.0)
Hemoglobin: 11.6 g/dL — ABNORMAL LOW (ref 12.0–15.0)
O2 Saturation: 93 %
Patient temperature: 98.6
Potassium: 5.2 mmol/L — ABNORMAL HIGH (ref 3.5–5.1)
Sodium: 129 mmol/L — ABNORMAL LOW (ref 135–145)
TCO2: 15 mmol/L — ABNORMAL LOW (ref 22–32)
pCO2 arterial: 35.1 mmHg (ref 32.0–48.0)
pH, Arterial: 7.198 — CL (ref 7.350–7.450)
pO2, Arterial: 82 mmHg — ABNORMAL LOW (ref 83.0–108.0)

## 2021-07-29 LAB — POCT I-STAT 7, (LYTES, BLD GAS, ICA,H+H)
Acid-base deficit: 18 mmol/L — ABNORMAL HIGH (ref 0.0–2.0)
Bicarbonate: 11.5 mmol/L — ABNORMAL LOW (ref 20.0–28.0)
Calcium, Ion: 1.04 mmol/L — ABNORMAL LOW (ref 1.15–1.40)
HCT: 32 % — ABNORMAL LOW (ref 36.0–46.0)
Hemoglobin: 10.9 g/dL — ABNORMAL LOW (ref 12.0–15.0)
O2 Saturation: 99 %
Patient temperature: 98.1
Potassium: 5.6 mmol/L — ABNORMAL HIGH (ref 3.5–5.1)
Sodium: 126 mmol/L — ABNORMAL LOW (ref 135–145)
TCO2: 13 mmol/L — ABNORMAL LOW (ref 22–32)
pCO2 arterial: 40 mmHg (ref 32.0–48.0)
pH, Arterial: 7.066 — CL (ref 7.350–7.450)
pO2, Arterial: 224 mmHg — ABNORMAL HIGH (ref 83.0–108.0)

## 2021-07-29 LAB — GLUCOSE, CAPILLARY: Glucose-Capillary: 62 mg/dL — ABNORMAL LOW (ref 70–99)

## 2021-07-29 LAB — RESP PANEL BY RT-PCR (FLU A&B, COVID) ARPGX2
Influenza A by PCR: NEGATIVE
Influenza B by PCR: NEGATIVE
SARS Coronavirus 2 by RT PCR: POSITIVE — AB

## 2021-07-29 LAB — RAPID URINE DRUG SCREEN, HOSP PERFORMED
Amphetamines: NOT DETECTED
Barbiturates: NOT DETECTED
Benzodiazepines: NOT DETECTED
Cocaine: NOT DETECTED
Opiates: NOT DETECTED
Tetrahydrocannabinol: NOT DETECTED

## 2021-07-29 LAB — MAGNESIUM: Magnesium: 1.9 mg/dL (ref 1.7–2.4)

## 2021-07-29 LAB — HIV ANTIBODY (ROUTINE TESTING W REFLEX): HIV Screen 4th Generation wRfx: NONREACTIVE

## 2021-07-29 LAB — ECHOCARDIOGRAM COMPLETE
Height: 65 in
S' Lateral: 3.1 cm
Single Plane A4C EF: 35.4 %
Weight: 2224 oz

## 2021-07-29 LAB — BASIC METABOLIC PANEL
Anion gap: 15 (ref 5–15)
BUN: 17 mg/dL (ref 6–20)
CO2: 14 mmol/L — ABNORMAL LOW (ref 22–32)
Calcium: 8.1 mg/dL — ABNORMAL LOW (ref 8.9–10.3)
Chloride: 102 mmol/L (ref 98–111)
Creatinine, Ser: 1.71 mg/dL — ABNORMAL HIGH (ref 0.44–1.00)
GFR, Estimated: 36 mL/min — ABNORMAL LOW (ref >60)
Glucose, Bld: 88 mg/dL (ref 70–99)
Potassium: 5.5 mmol/L — ABNORMAL HIGH (ref 3.5–5.1)
Sodium: 131 mmol/L — ABNORMAL LOW (ref 135–145)

## 2021-07-29 LAB — ETHANOL: Alcohol, Ethyl (B): <10 mg/dL (ref <10)

## 2021-07-29 LAB — SEDIMENTATION RATE: Sed Rate: 5 mm/hr (ref 0–22)

## 2021-07-29 LAB — TROPONIN I (HIGH SENSITIVITY)
Troponin I (High Sensitivity): 48 ng/L — ABNORMAL HIGH (ref <18)
Troponin I (High Sensitivity): 335 ng/L (ref <18)

## 2021-07-29 LAB — BRAIN NATRIURETIC PEPTIDE: B Natriuretic Peptide: 300.2 pg/mL — ABNORMAL HIGH (ref 0.0–100.0)

## 2021-07-29 LAB — C-REACTIVE PROTEIN: CRP: 0.7 mg/dL (ref <1.0)

## 2021-07-29 LAB — TSH: TSH: 1.435 u[IU]/mL (ref 0.350–4.500)

## 2021-07-29 LAB — LACTIC ACID, PLASMA
Lactic Acid, Venous: 7.2 mmol/L (ref 0.5–1.9)
Lactic Acid, Venous: 7.6 mmol/L (ref 0.5–1.9)

## 2021-07-29 LAB — SALICYLATE LEVEL: Salicylate Lvl: <7 mg/dL — ABNORMAL LOW (ref 7.0–30.0)

## 2021-07-29 LAB — D-DIMER, QUANTITATIVE: D-Dimer, Quant: >20 ug/mL-FEU — ABNORMAL HIGH (ref 0.00–0.50)

## 2021-07-29 LAB — ACETAMINOPHEN LEVEL: Acetaminophen (Tylenol), Serum: <10 ug/mL — ABNORMAL LOW (ref 10–30)

## 2021-07-29 IMAGING — DX DG ABDOMEN 1V
1 series · 1 of 1 positions shown · non-contrast
Comparison: None.

CLINICAL DATA: OG tube placement

EXAM:
ABDOMEN - 1 VIEW

[abdomen]
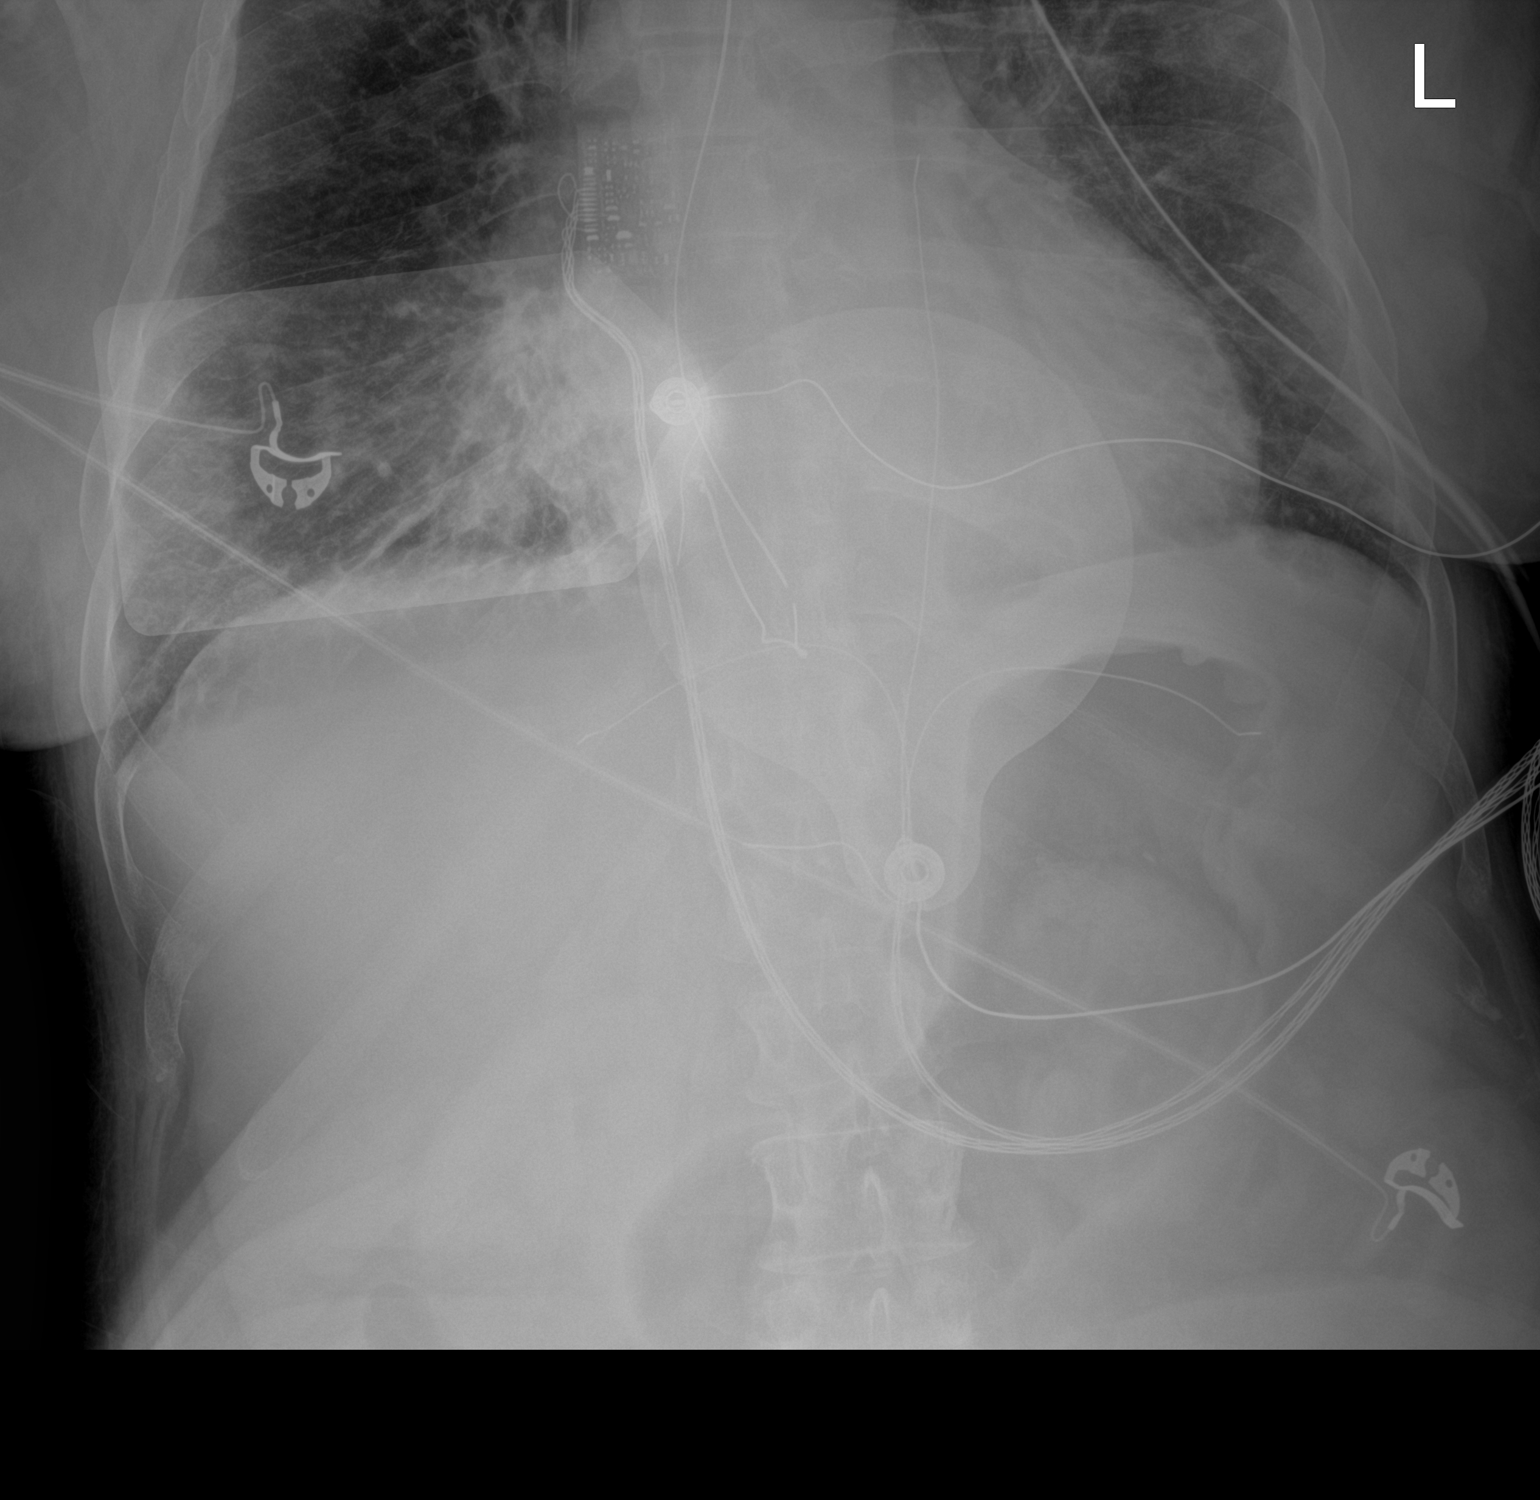

[1 of 1 positions shown; findings below may reference images not displayed]

FINDINGS: OG tube loops on itself in the distal esophagus with the tip in the
distal esophagus. Right base atelectasis or infiltrate.
Nonobstructive bowel gas pattern.
IMPRESSION: NG tube loops on itself with the tip in the distal esophagus.

## 2021-07-29 IMAGING — DX DG CHEST 1V PORT
1 series · 1 of 1 positions shown · non-contrast
Comparison: [DATE]

CLINICAL DATA: Post CPR

EXAM:
PORTABLE CHEST 1 VIEW

[chest ap]
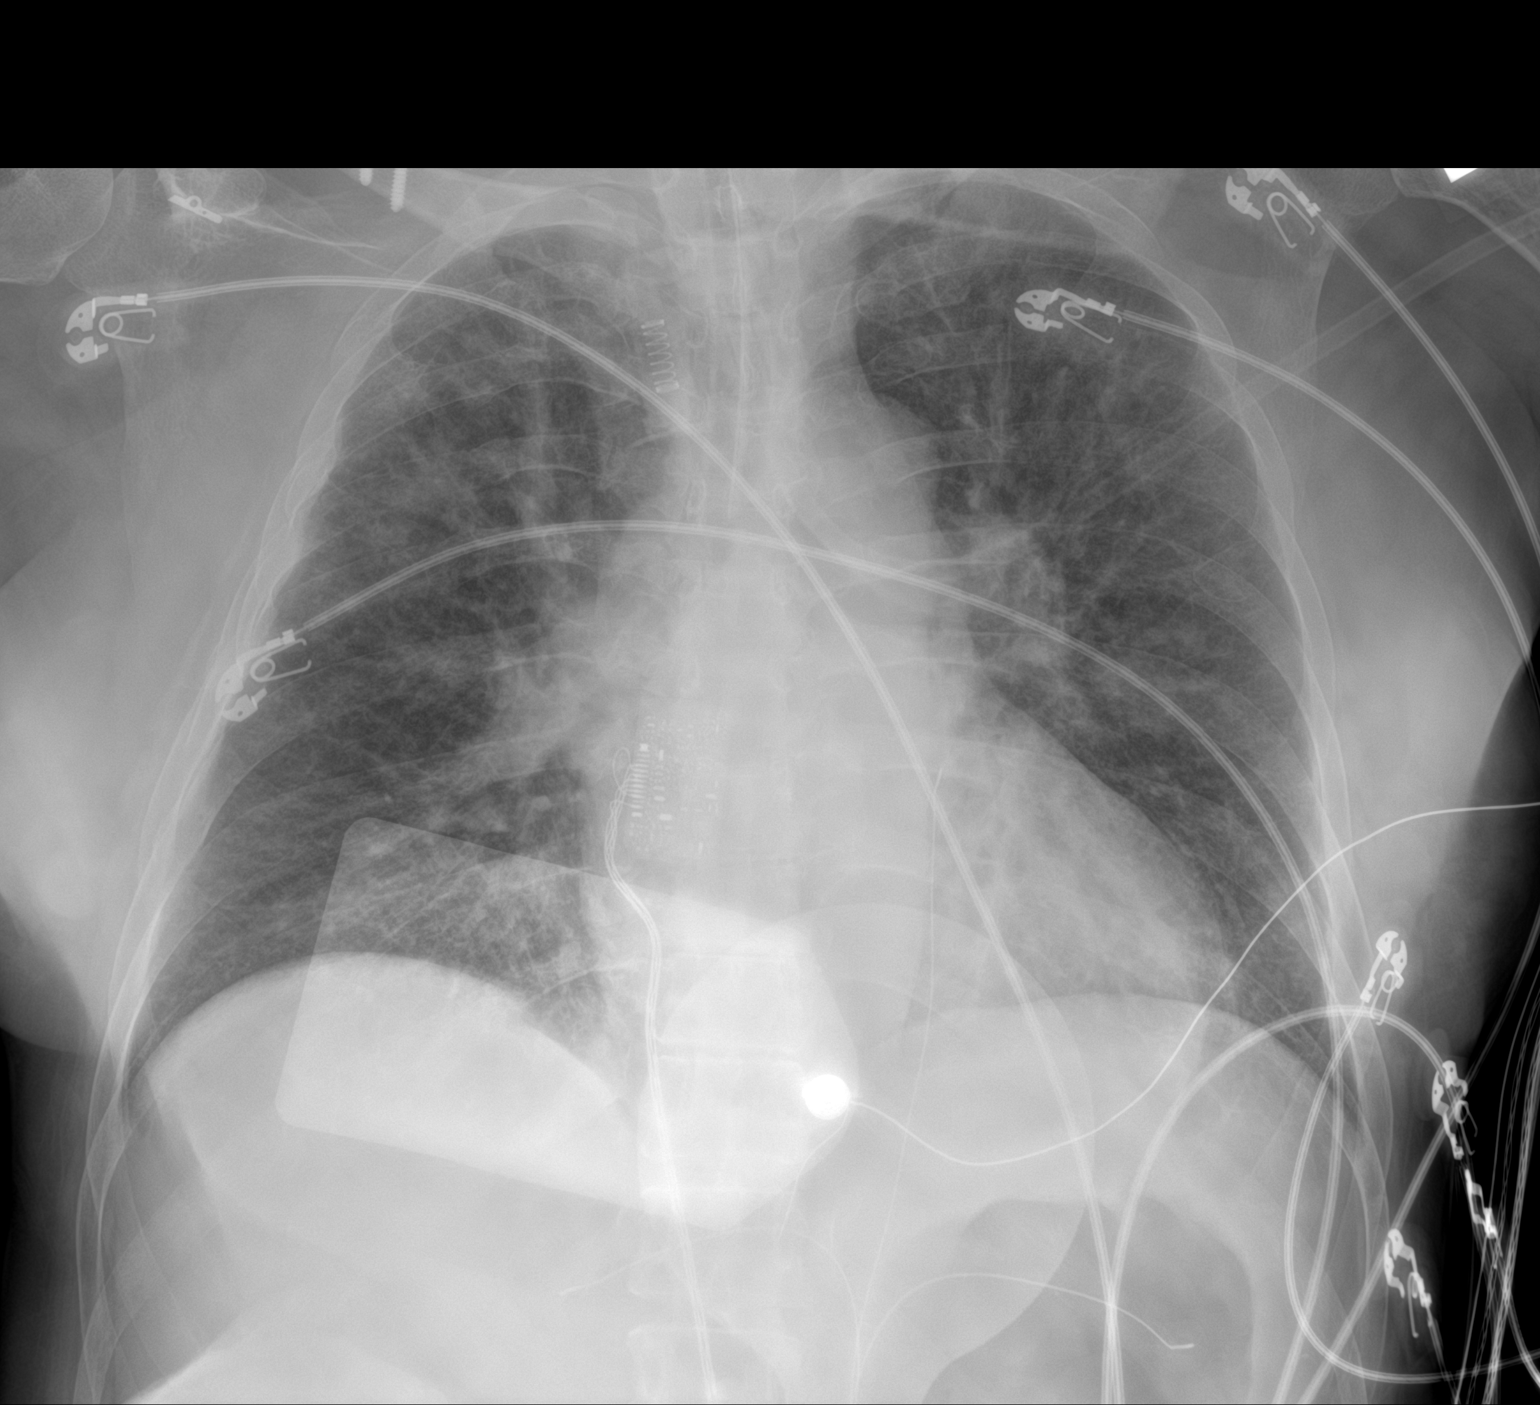

[1 of 1 positions shown; findings below may reference images not displayed]

FINDINGS: Low lung volumes. Pulmonary vascular congestion with edema. No
pleural effusion. No pneumothorax. Cardiomediastinal contours are
within normal limits. Endotracheal tube is just above the carina and
retraction is recommended.
IMPRESSION: Endotracheal tube just above carina.  Retraction recommended.

Pulmonary vascular congestion with edema.

## 2021-07-29 IMAGING — CT CT ANGIO CHEST
2 of 7 series · 17 of 46 positions shown · IV contrast (APPLIED)
Comparison: Current chest radiograph.  Prior chest CT, [DATE].

CLINICAL DATA: Cardiac Arrest; PE suspected, high prob; Patient
present in CT on ventilator

EXAM:
CT ANGIOGRAPHY CHEST WITH CONTRAST
TECHNIQUE: Multidetector CT imaging of the chest was performed using the
standard protocol during bolus administration of intravenous
contrast. Multiplanar CT image reconstructions and MIPs were
obtained to evaluate the vascular anatomy.
CONTRAST:  60mL OMNIPAQUE IOHEXOL 350 MG/ML SOLN

[Series 7: thins · axial · 0.68mm/px · z∈[-624,-389]mm · 14 of 380 slices shown]
[im 22/380  lung]
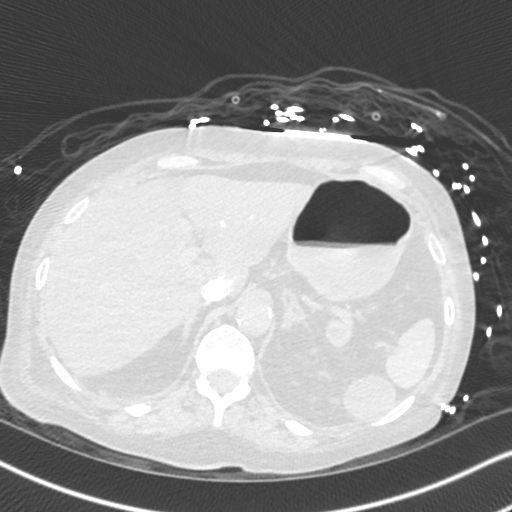
[im 43/380  soft-tissue]
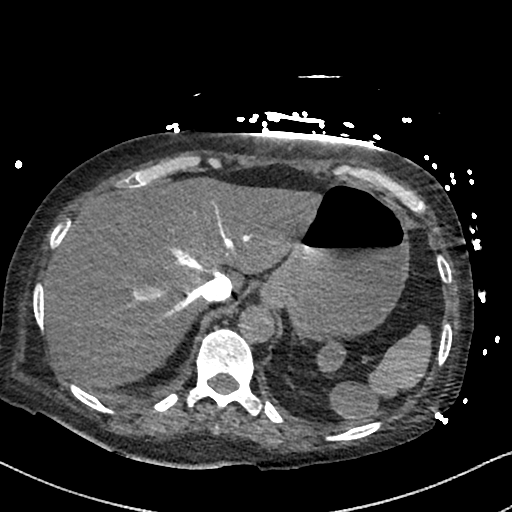
[im 85/380  lung]
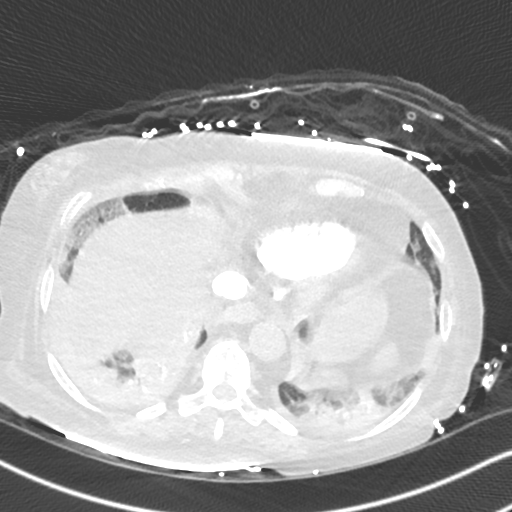
[im 106/380  soft-tissue]
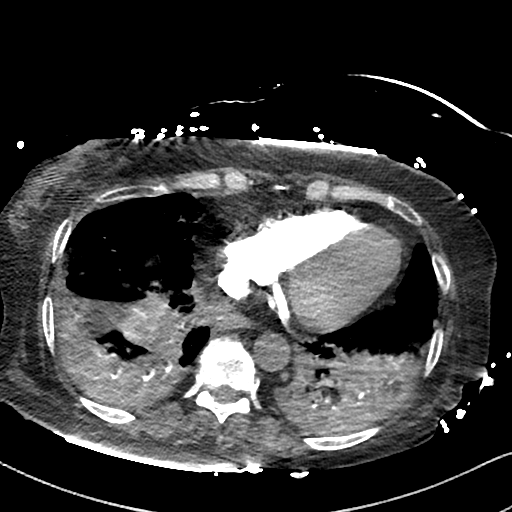
[im 127/380  lung]
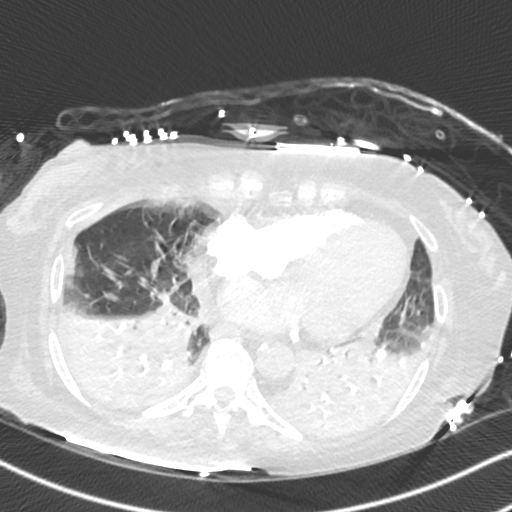
[im 148/380  soft-tissue]
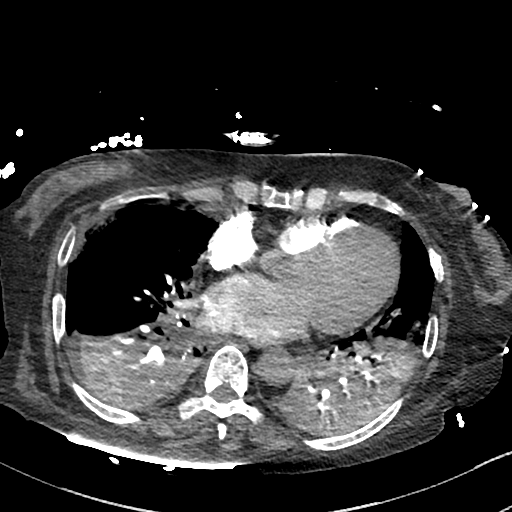
[im 169/380  lung]
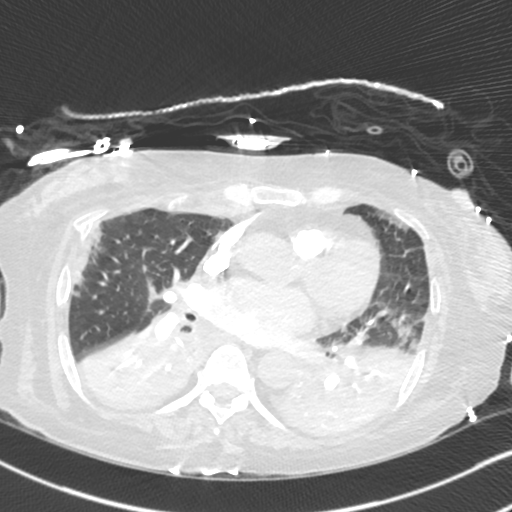
[im 211/380  soft-tissue]
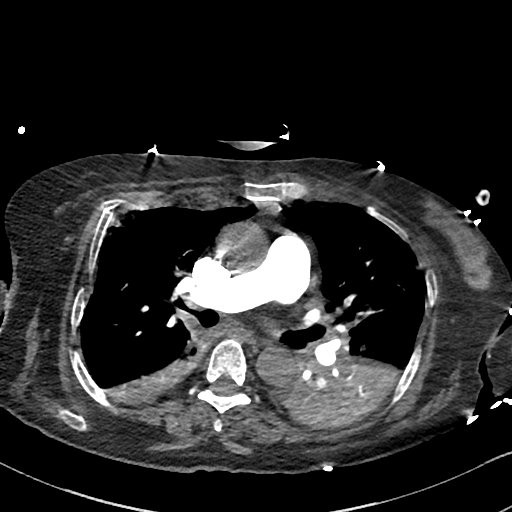
[im 232/380  lung]
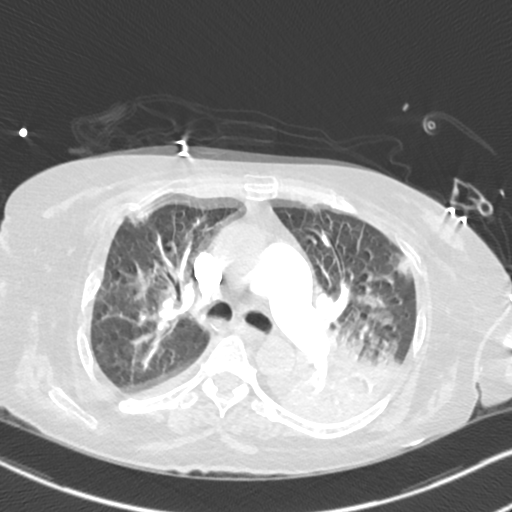
[im 253/380  soft-tissue]
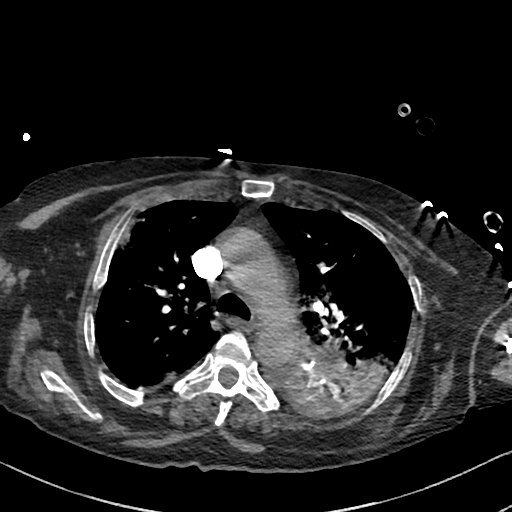
[im 274/380  lung]
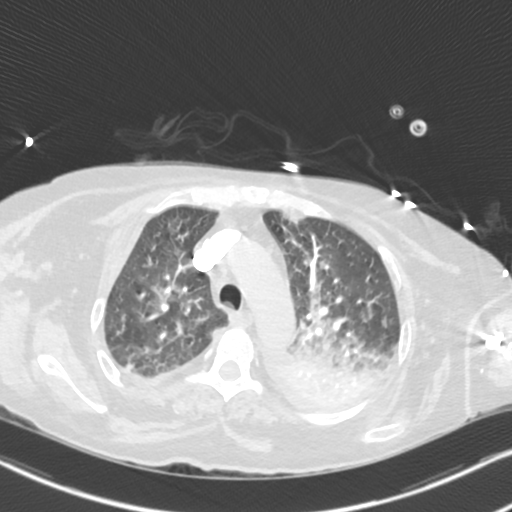
[im 295/380  soft-tissue]
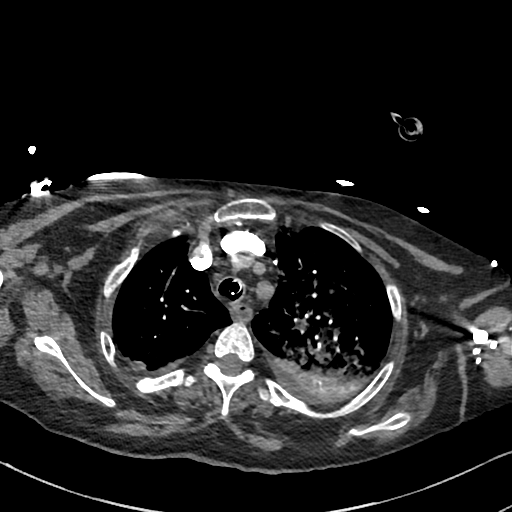
[im 337/380  lung]
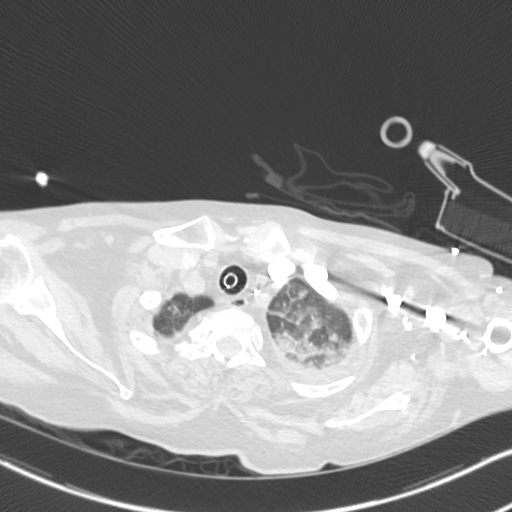
[im 358/380  soft-tissue]
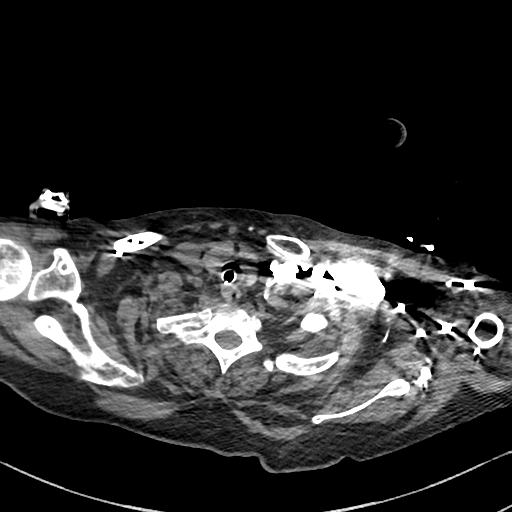

[Series 8: cor · coronal · 0.58mm/px · 3 of 139 slices shown]
[im 35/139  soft-tissue]
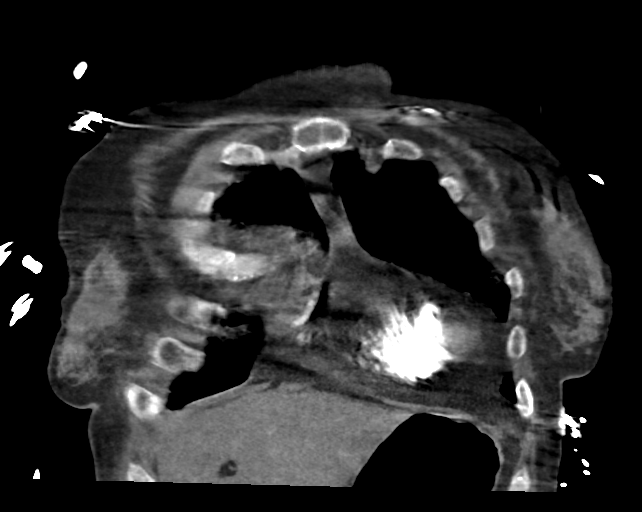
[im 70/139  soft-tissue]
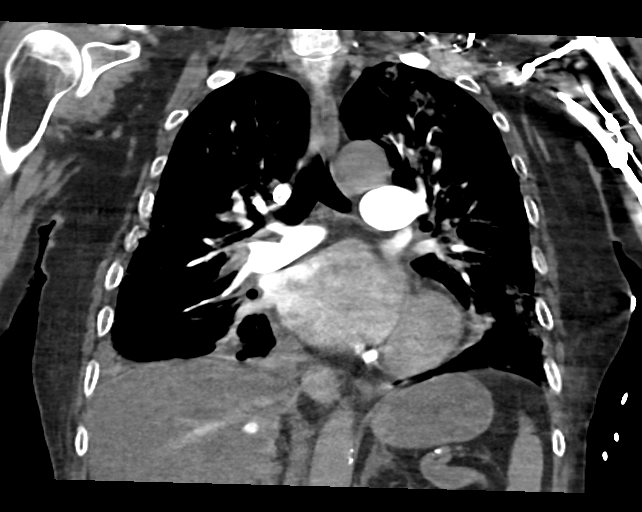
[im 104/139  soft-tissue]
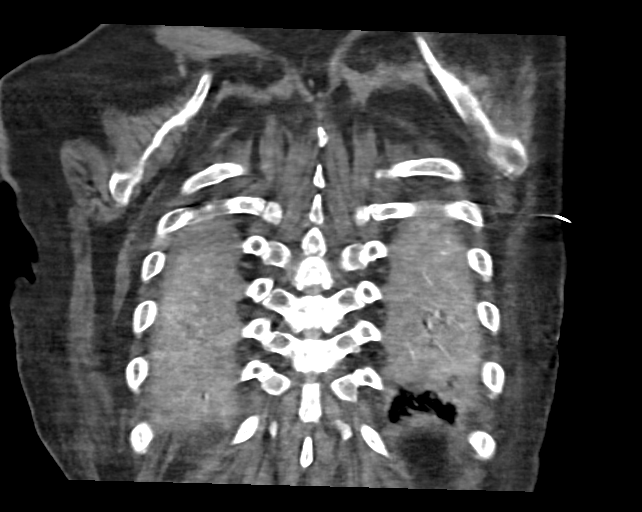

[17 of 46 positions shown; findings below may reference images not displayed]

FINDINGS: Cardiovascular: Pulmonary arteries are well opacified. Study is
somewhat degraded by motion. Allowing for the respiratory motion
degradation, there is no evidence of a pulmonary embolism.

Heart is normal in size and configuration. No pericardial effusion.
Great vessels normal in caliber. Aorta is not opacified. Mild
descending thoracic aorta atherosclerotic calcifications.

Mediastinum/Nodes: Endotracheal tube tip lies 2.8 cm above the
carina. Visualized thyroid is unremarkable. No mediastinal or hilar
masses or enlarged lymph nodes. Trachea and esophagus are
unremarkable.

Lungs/Pleura: Near complete consolidation/atelectasis of the lower
lobes, with more volume loss noted on the right. Patchy airspace
opacities in the upper lobes, greater on the left, ground-glass
opacities evident in the right middle lobe. Bilateral interstitial
thickening, which is most evident in the upper lobes.

No pneumothorax.

Upper Abdomen: No acute abnormality.

Musculoskeletal: Bilateral anterior rib fractures. On the right,
these involve the second, third, fourth, fifth and sixth ribs, and
likely the 7. There also more subtle fractures noted on the left,
which are mole likely chronic or subacute. Possible subtle sternal
fracture, although sternal assessment is limited by significant
respiratory motion. No vertebral fractures.

Review of the MIP images confirms the above findings.
IMPRESSION: 1. No convincing pulmonary embolism. Study degraded by respiratory
motion.
2. Lower lobes nearly opacified, likely a significant component, if
not completely, atelectasis.
3. Additional patchy areas airspace opacities in both upper lobes
and the right middle lobe, most evident in the left upper lobe,
consistent with multifocal pneumonia, possibly due to areas of edema
or lung contusion from CPR.
4. Interstitial thickening.  Suspect interstitial edema.
5. Anterior rib fractures, which are most evident on the right with
a appear recent/acute. Left-sided rib fractures may be chronic or
subacute. Right-sided fractures may be from CPR.
6. Well-positioned endotracheal tube.

Aortic Atherosclerosis ([HR]-[HR]).

## 2021-07-29 IMAGING — CT CT HEAD W/O CM
4 series · 16 of 47 positions shown, 18 images · non-contrast
Comparison: [DATE]

CLINICAL DATA: Altered mental status, status post cardiac arrest

EXAM:
CT HEAD WITHOUT CONTRAST
TECHNIQUE: Contiguous axial images were obtained from the base of the skull
through the vertex without intravenous contrast.

[Series 3: head wo · axial · 0.43mm/px · z∈[-163,-48]mm · 7 of 31 slices shown, 9 images]
[im 4/31  brain]
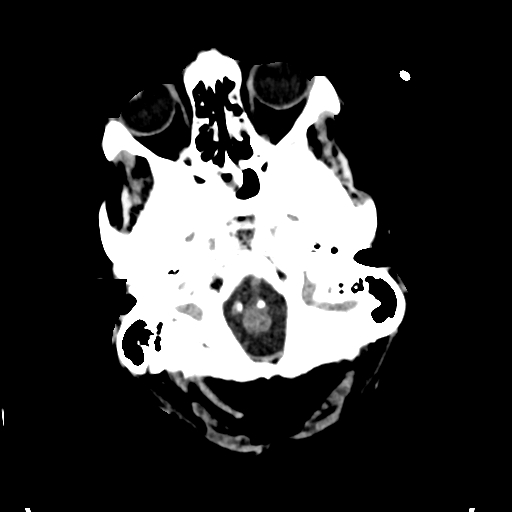
[im 4/31  bone]
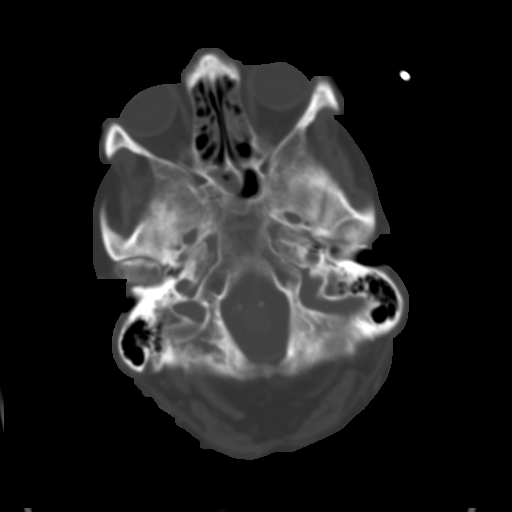
[im 8/31  brain]
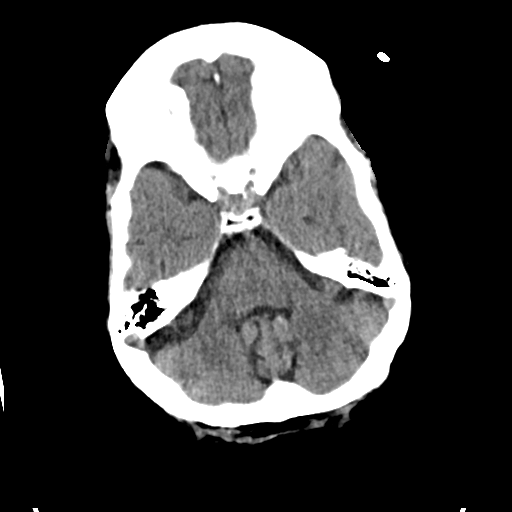
[im 12/31  brain]
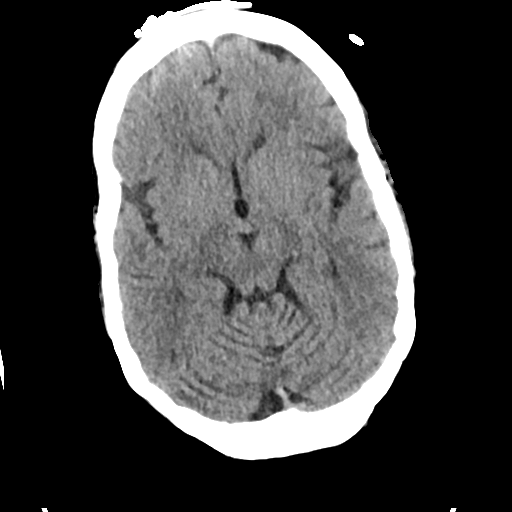
[im 16/31  brain]
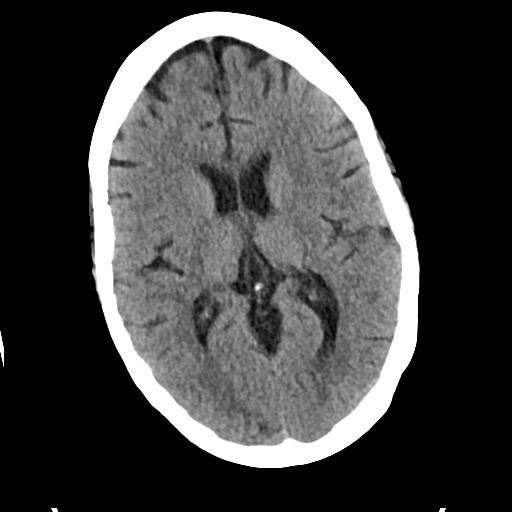
[im 19/31  brain]
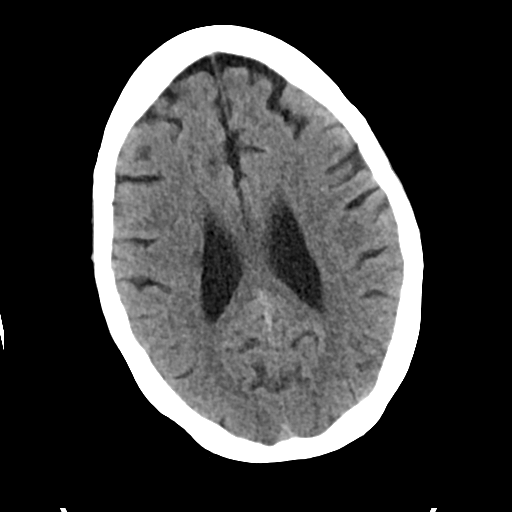
[im 19/31  bone]
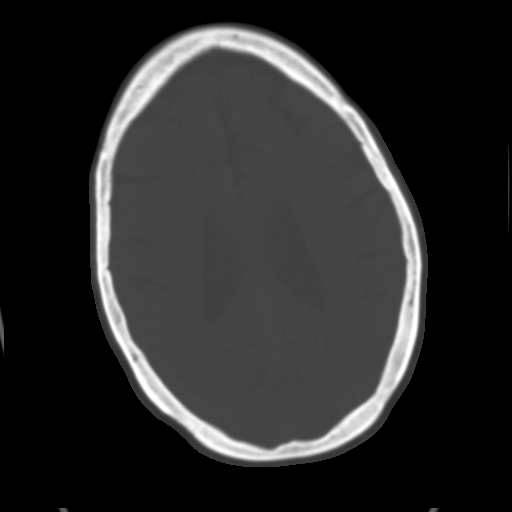
[im 23/31  brain]
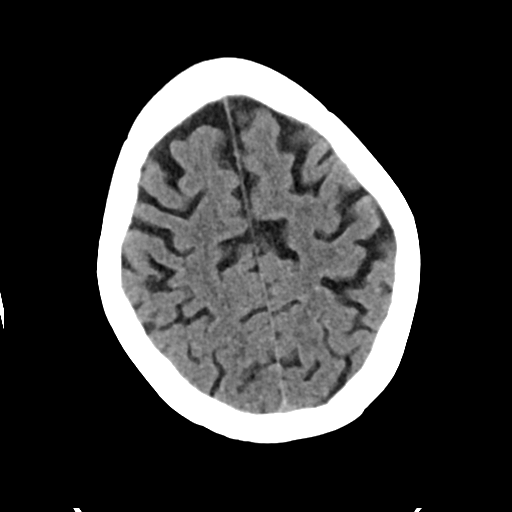
[im 27/31  brain]
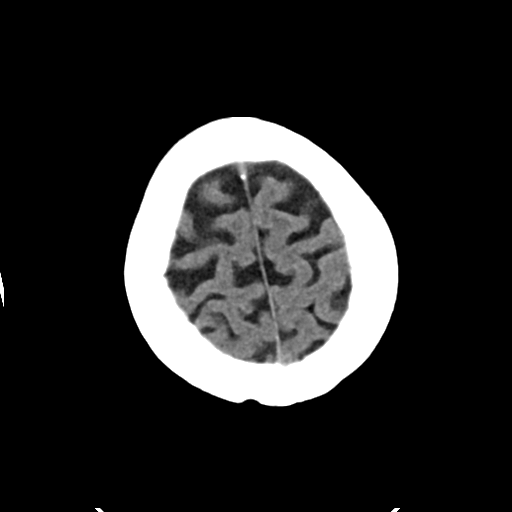

[Series 4: head bone · axial · 0.43mm/px · z∈[-164,-134]mm · 3 of 76 slices shown]
[im 8/76  bone]
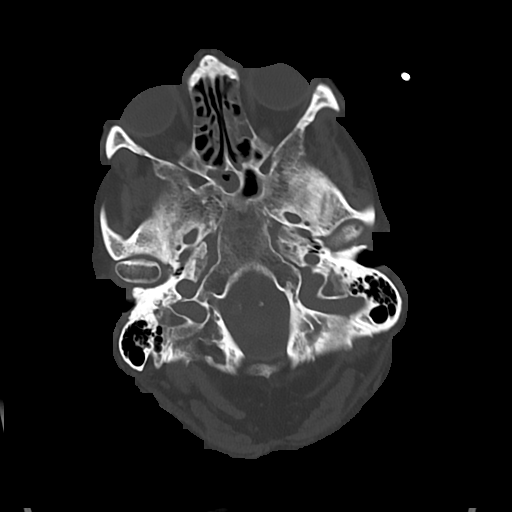
[im 16/76  bone]
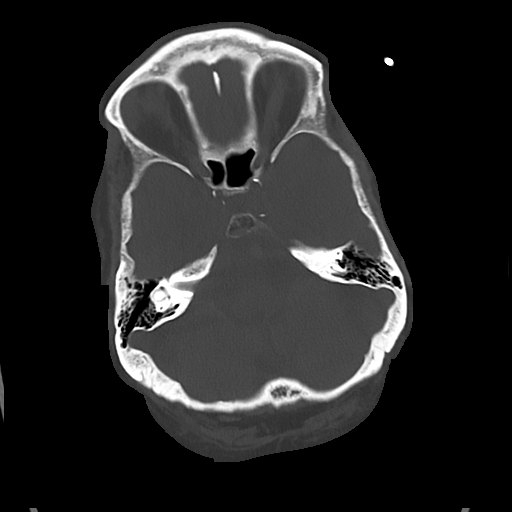
[im 23/76  bone]
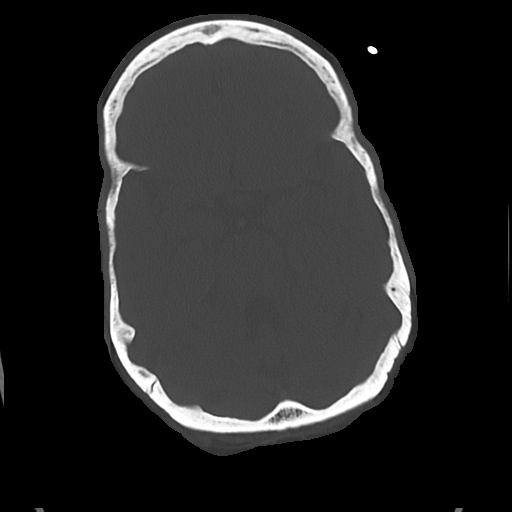

[Series 5: cor soft · coronal · 0.33mm/px · 3 of 72 slices shown]
[im 24/72  brain]
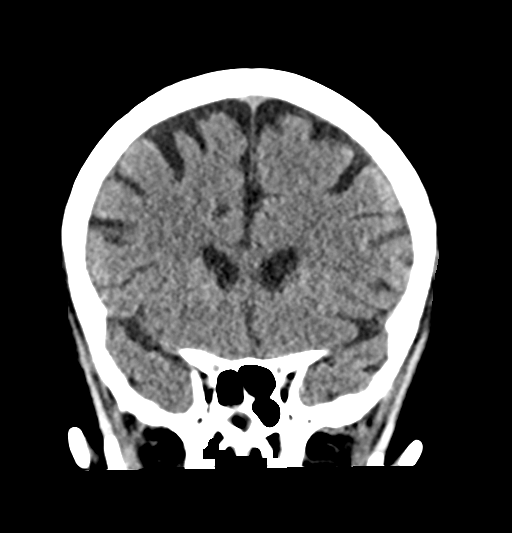
[im 32/72  brain]
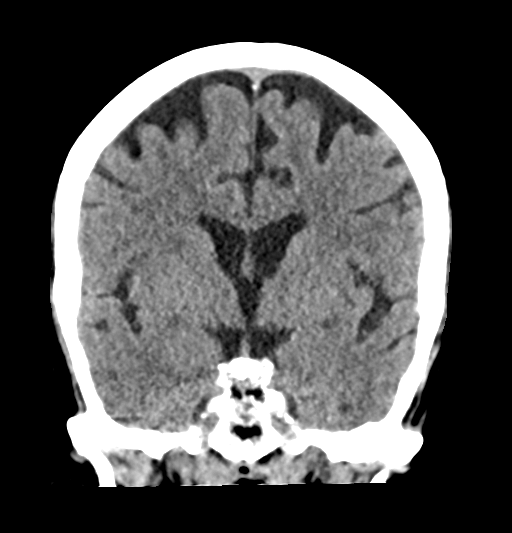
[im 40/72  brain]
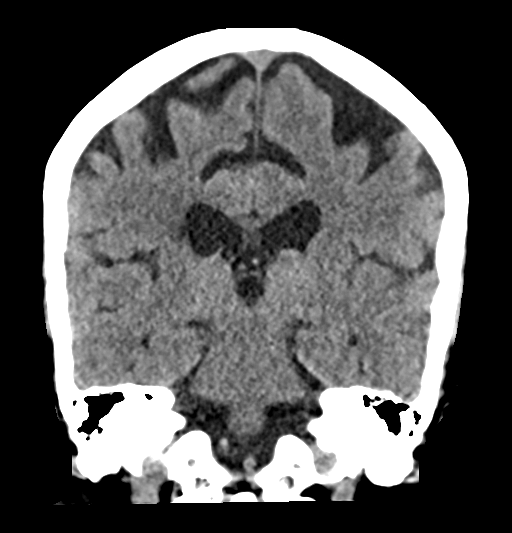

[Series 6: sag soft · sagittal · 0.35mm/px · 3 of 50 slices shown]
[im 17/50  brain]
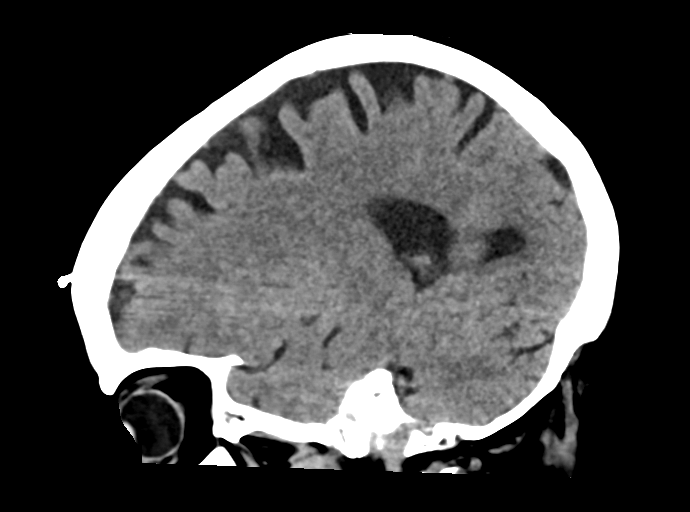
[im 25/50  brain]
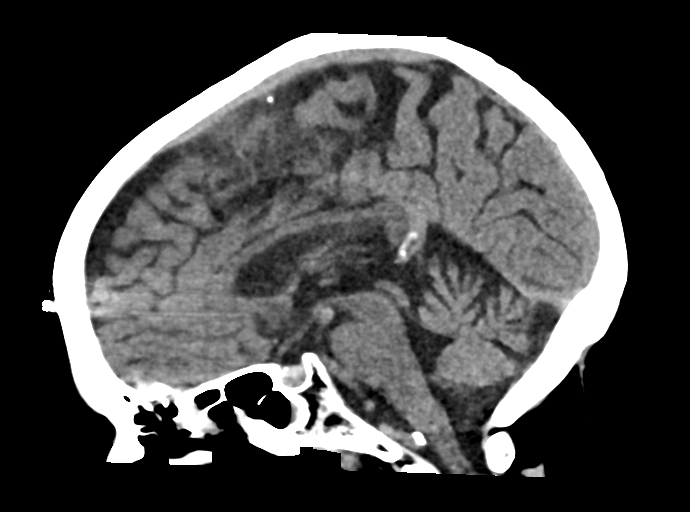
[im 33/50  brain]
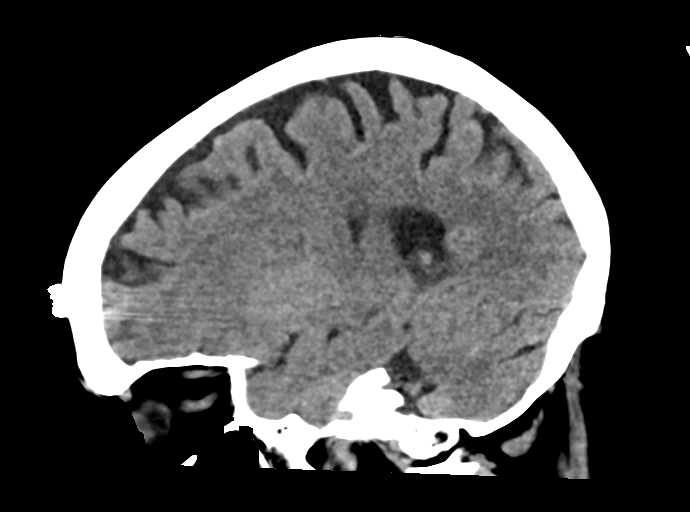

[16 of 47 positions shown; findings below may reference images not displayed]

FINDINGS: Brain: A 0.7 by 0.5 cm hypodensity favoring chronic lacunar infarct
is present in the right periventricular white matter on image 18
series 3. This has a chronic appearance, but was not present on the
prior CT scan from [DATE].

There is well maintained gray-white differentiation with continued
normal conspicuity of the basal ganglia and thalami. Mild
progressive sulcal prominence particularly along the vertex, mildly
advanced for age. No intracranial hemorrhage, mass lesion, or acute
CVA.

Vascular: There is atherosclerotic calcification of the cavernous
carotid arteries bilaterally.

Skull: Unremarkable

Sinuses/Orbits: Chronic mucosal thickening in the maxillary sinuses,
ethmoid air cells, and sphenoid sinuses noted compatible with
chronic sinusitis. There are air fluid levels in the sphenoid
sinuses which could reflect superimposed acute sinusitis but may
also be alternatively related to the patient's intubation.

Other: No supplemental non-categorized findings.
IMPRESSION: 1. No compelling findings of acute stroke/global anoxic injury. No
intracranial hemorrhage or acute intracranial findings.
2. A small chronic lacunar infarct in the right periventricular
white matter is noted. Although this has chronic characteristics, it
was not present on the [DATE] CT scan.
3. Mildly age advanced cerebral atrophy manifesting as sulcal
prominence along the vertex.
4. Chronic paranasal sinusitis. Air-fluid levels in the sphenoid
sinuses could be from intubation or acute sinusitis.

## 2021-07-29 IMAGING — DX DG CHEST 1V PORT
1 series · 1 of 1 positions shown · non-contrast
Comparison: Radiograph and CTA earlier today.

CLINICAL DATA: Central line placement.

EXAM:
PORTABLE CHEST 1 VIEW

[chest ap]
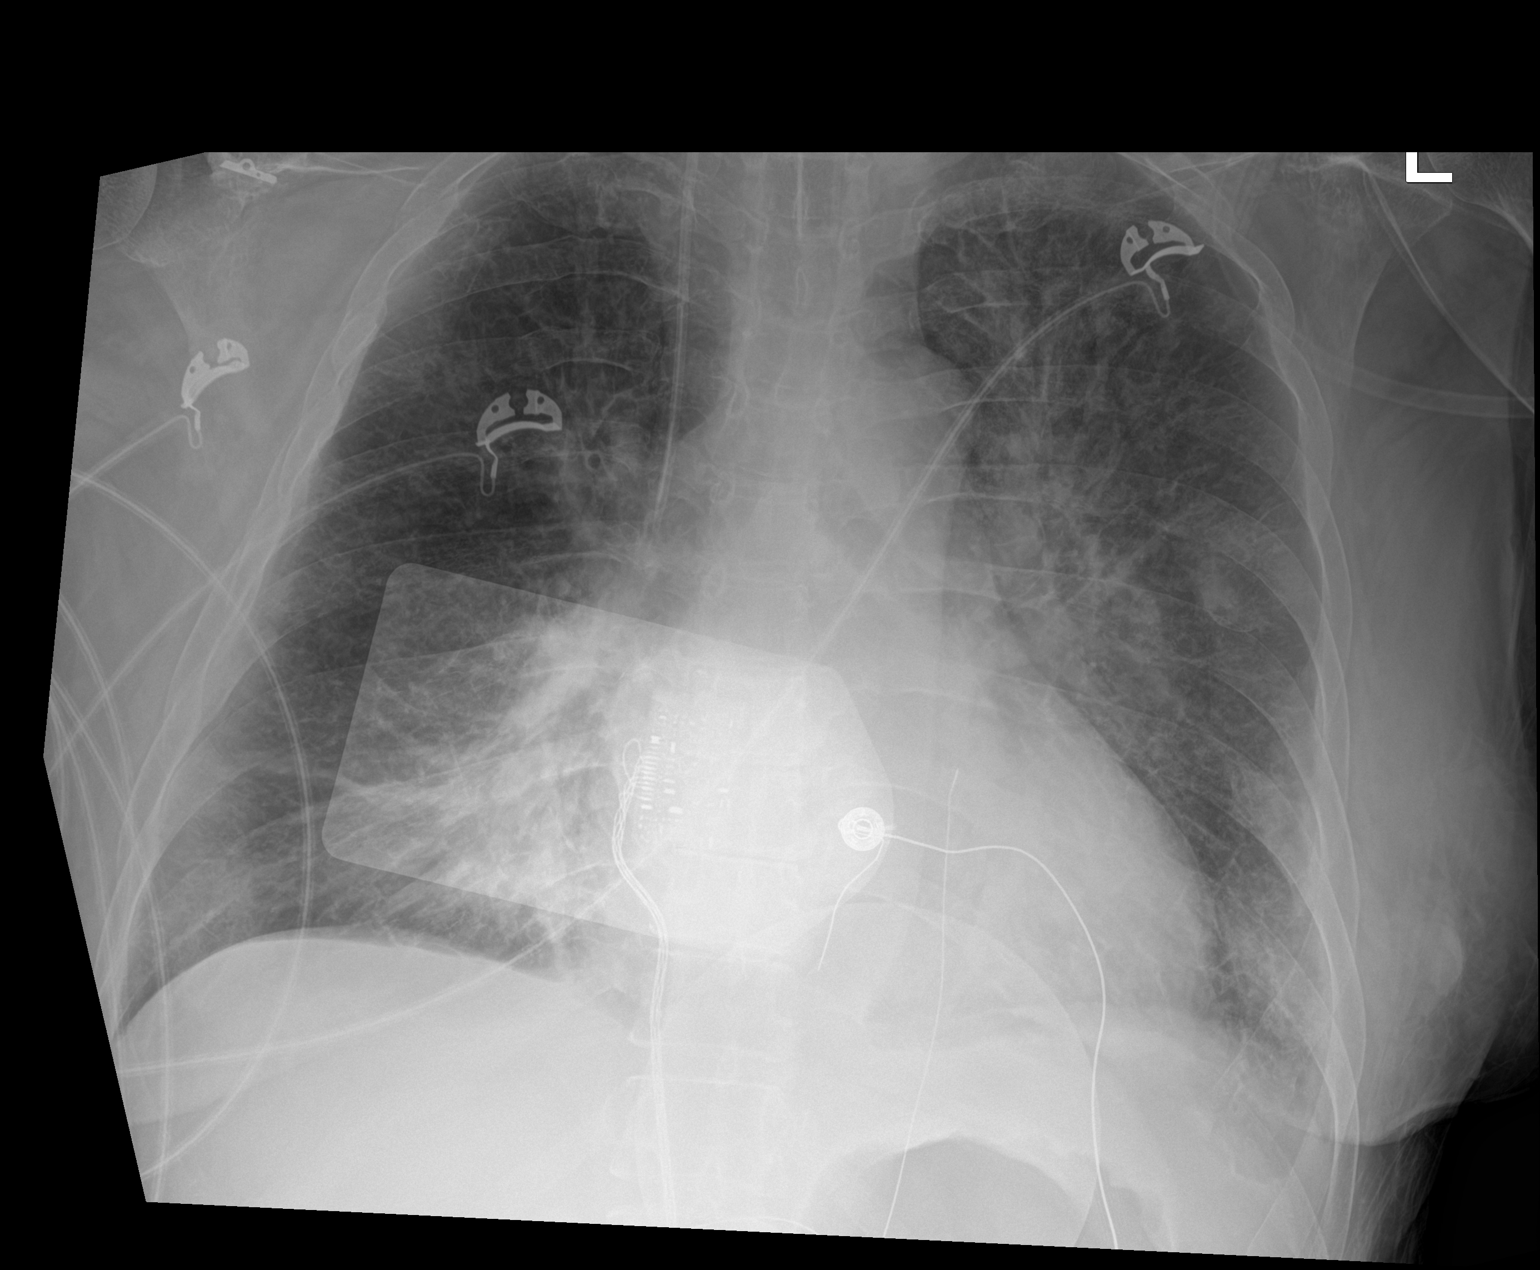

[1 of 1 positions shown; findings below may reference images not displayed]

FINDINGS: There is a new right internal jugular central venous catheter with
tip overlying the SVC. No pneumothorax. The endotracheal tube is
been retracted, tip now 5.9 cm from the carina. Decreasing opacity
in the right upper lobe from prior exam, however worsening of
opacity throughout the left hemithorax. Stable heart size and
mediastinal contours. Bibasilar opacity better appreciated on prior
CT, without interval radiographic change. Slight improvement in
pulmonary edema.
IMPRESSION: 1. New right internal jugular central venous catheter with tip
overlying the SVC. No pneumothorax.
2. Endotracheal tube has been retracted, tip now 5.9 cm from the
carina.
3. Improving right upper lobe aeration, however worsening opacity
throughout the left hemithorax. Bibasilar opacification was better
appreciated on CT.
4. Improving pulmonary edema.

## 2021-07-29 MED ORDER — AMIODARONE HCL IN DEXTROSE 360-4.14 MG/200ML-% IV SOLN: 30.0000 mg/h | INTRAVENOUS | Status: DC

## 2021-07-29 MED ORDER — AMIODARONE HCL IN DEXTROSE 360-4.14 MG/200ML-% IV SOLN
30.0000 mg/h | INTRAVENOUS | Status: DC
  Administered 2021-07-30 (×2): 30 mg/h via INTRAVENOUS
  Filled 2021-07-29: qty 200

## 2021-07-29 MED ORDER — FENTANYL 2500MCG IN NS 250ML (10MCG/ML) PREMIX INFUSION
0.0000 ug/h | INTRAVENOUS | Status: DC
  Administered 2021-07-29: 25 ug/h via INTRAVENOUS
  Administered 2021-07-30: 200 ug/h via INTRAVENOUS
  Administered 2021-07-31: 100 ug/h via INTRAVENOUS
  Administered 2021-07-31: 200 ug/h via INTRAVENOUS
  Filled 2021-07-29 (×4): qty 250

## 2021-07-29 MED ORDER — ATROPINE SULFATE 1 MG/10ML IJ SOSY
1.0000 mg | PREFILLED_SYRINGE | Freq: Once | INTRAMUSCULAR | Status: DC
  Filled 2021-07-29: qty 10

## 2021-07-29 MED ORDER — HEPARIN SODIUM (PORCINE) 5000 UNIT/ML IJ SOLN: 5000.0000 [IU] | Freq: Three times a day (TID) | INTRAMUSCULAR | Status: DC

## 2021-07-29 MED ORDER — PANTOPRAZOLE SODIUM 40 MG IV SOLR
40.0000 mg | Freq: Every day | INTRAVENOUS | Status: DC
  Administered 2021-07-29 – 2021-07-30 (×2): 40 mg via INTRAVENOUS
  Filled 2021-07-29 (×2): qty 40

## 2021-07-29 MED ORDER — ACETAMINOPHEN 325 MG PO TABS: 650.0000 mg | ORAL_TABLET | ORAL | Status: DC | PRN

## 2021-07-29 MED ORDER — NOREPINEPHRINE 4 MG/250ML-% IV SOLN
0.0000 ug/min | INTRAVENOUS | Status: DC
  Administered 2021-07-29: 16 ug/min via INTRAVENOUS
  Filled 2021-07-29: qty 500

## 2021-07-29 MED ORDER — MIDAZOLAM HCL 2 MG/2ML IJ SOLN
2.0000 mg | INTRAMUSCULAR | Status: DC | PRN
  Administered 2021-07-29: 2 mg via INTRAVENOUS

## 2021-07-29 MED ORDER — POLYETHYLENE GLYCOL 3350 17 G PO PACK
17.0000 g | PACK | Freq: Every day | ORAL | Status: DC
  Administered 2021-07-30 – 2021-08-02 (×4): 17 g
  Filled 2021-07-29 (×5): qty 1

## 2021-07-29 MED ORDER — FENTANYL CITRATE PF 50 MCG/ML IJ SOSY
50.0000 ug | PREFILLED_SYRINGE | INTRAMUSCULAR | Status: DC | PRN
  Administered 2021-07-29: 100 ug via INTRAVENOUS

## 2021-07-29 MED ORDER — SODIUM CHLORIDE 0.9 % IV SOLN
250.0000 mL | INTRAVENOUS | Status: DC
  Administered 2021-08-02: 250 mL via INTRAVENOUS

## 2021-07-29 MED ORDER — DEXTROSE 50 % IV SOLN
INTRAVENOUS | Status: AC
  Administered 2021-07-29: 50 mL
  Filled 2021-07-29: qty 50

## 2021-07-29 MED ORDER — SODIUM CHLORIDE 0.9 % IV SOLN
200.0000 mg | Freq: Once | INTRAVENOUS | Status: AC
  Administered 2021-07-29: 200 mg via INTRAVENOUS
  Filled 2021-07-29 (×2): qty 40

## 2021-07-29 MED ORDER — SODIUM CHLORIDE 0.9 % IV SOLN
3.0000 g | Freq: Four times a day (QID) | INTRAVENOUS | Status: DC
  Administered 2021-07-29 – 2021-07-30 (×3): 3 g via INTRAVENOUS
  Filled 2021-07-29 (×6): qty 8

## 2021-07-29 MED ORDER — FENTANYL CITRATE (PF) 100 MCG/2ML IJ SOLN
50.0000 ug | INTRAMUSCULAR | Status: DC | PRN
  Administered 2021-07-29 – 2021-08-04 (×2): 100 ug via INTRAVENOUS
  Filled 2021-07-29 (×3): qty 2

## 2021-07-29 MED ORDER — DOCUSATE SODIUM 100 MG PO CAPS: 100.0000 mg | ORAL_CAPSULE | Freq: Two times a day (BID) | ORAL | Status: DC | PRN

## 2021-07-29 MED ORDER — LACTATED RINGERS IV BOLUS
1000.0000 mL | Freq: Once | INTRAVENOUS | Status: AC
  Administered 2021-07-29: 1000 mL via INTRAVENOUS

## 2021-07-29 MED ORDER — SODIUM BICARBONATE 8.4 % IV SOLN
50.0000 meq | Freq: Once | INTRAVENOUS | Status: AC
  Administered 2021-07-29: 50 meq via INTRAVENOUS
  Filled 2021-07-29: qty 50

## 2021-07-29 MED ORDER — FENTANYL CITRATE PF 50 MCG/ML IJ SOSY: 50.0000 ug | PREFILLED_SYRINGE | INTRAMUSCULAR | Status: DC | PRN

## 2021-07-29 MED ORDER — HEPARIN BOLUS VIA INFUSION
3750.0000 [IU] | Freq: Once | INTRAVENOUS | Status: DC
  Filled 2021-07-29: qty 3750

## 2021-07-29 MED ORDER — LACTATED RINGERS IV SOLN: INTRAVENOUS | Status: DC

## 2021-07-29 MED ORDER — CHLORHEXIDINE GLUCONATE 0.12% ORAL RINSE (MEDLINE KIT)
15.0000 mL | Freq: Two times a day (BID) | OROMUCOSAL | Status: DC
  Administered 2021-07-30 – 2021-08-02 (×8): 15 mL via OROMUCOSAL

## 2021-07-29 MED ORDER — ORAL CARE MOUTH RINSE
15.0000 mL | OROMUCOSAL | Status: DC
  Administered 2021-07-30 – 2021-08-04 (×53): 15 mL via OROMUCOSAL

## 2021-07-29 MED ORDER — HEPARIN (PORCINE) 25000 UT/250ML-% IV SOLN
1100.0000 [IU]/h | INTRAVENOUS | Status: DC
  Administered 2021-07-29: 750 [IU]/h via INTRAVENOUS
  Administered 2021-07-30: 1100 [IU]/h via INTRAVENOUS
  Filled 2021-07-29 (×2): qty 250

## 2021-07-29 MED ORDER — LEVALBUTEROL HCL 0.63 MG/3ML IN NEBU: 0.6300 mg | INHALATION_SOLUTION | Freq: Four times a day (QID) | RESPIRATORY_TRACT | Status: DC | PRN

## 2021-07-29 MED ORDER — PROPOFOL 1000 MG/100ML IV EMUL
0.0000 ug/kg/min | INTRAVENOUS | Status: DC
  Administered 2021-07-29: 10 ug/kg/min via INTRAVENOUS
  Administered 2021-07-30 (×2): 30 ug/kg/min via INTRAVENOUS
  Filled 2021-07-29 (×3): qty 100

## 2021-07-29 MED ORDER — VASOPRESSIN 20 UNITS/100 ML INFUSION FOR SHOCK
0.0000 [IU]/min | INTRAVENOUS | Status: DC
  Administered 2021-07-29 – 2021-07-31 (×5): 0.04 [IU]/min via INTRAVENOUS
  Filled 2021-07-29 (×3): qty 100

## 2021-07-29 MED ORDER — AMIODARONE HCL IN DEXTROSE 360-4.14 MG/200ML-% IV SOLN
INTRAVENOUS | Status: AC
  Filled 2021-07-29: qty 200

## 2021-07-29 MED ORDER — MAGNESIUM SULFATE 2 GM/50ML IV SOLN
2.0000 g | Freq: Once | INTRAVENOUS | Status: AC
  Administered 2021-07-29: 2 g via INTRAVENOUS
  Filled 2021-07-29: qty 50

## 2021-07-29 MED ORDER — AMIODARONE HCL IN DEXTROSE 360-4.14 MG/200ML-% IV SOLN
60.0000 mg/h | INTRAVENOUS | Status: AC
  Administered 2021-07-29: 60 mg/h via INTRAVENOUS
  Filled 2021-07-29: qty 200

## 2021-07-29 MED ORDER — MIDAZOLAM HCL 2 MG/2ML IJ SOLN
2.0000 mg | INTRAMUSCULAR | Status: DC | PRN
Start: 2021-07-29 — End: 2021-07-29
  Filled 2021-07-29: qty 2

## 2021-07-29 MED ORDER — IOHEXOL 350 MG/ML SOLN
60.0000 mL | Freq: Once | INTRAVENOUS | Status: AC | PRN
  Administered 2021-07-29: 60 mL via INTRAVENOUS

## 2021-07-29 MED ORDER — NOREPINEPHRINE 4 MG/250ML-% IV SOLN
2.0000 ug/min | INTRAVENOUS | Status: DC
  Administered 2021-07-29: 2 ug/min via INTRAVENOUS
  Filled 2021-07-29: qty 250

## 2021-07-29 MED ORDER — EPINEPHRINE HCL 5 MG/250ML IV SOLN IN NS
0.5000 ug/min | INTRAVENOUS | Status: DC
  Administered 2021-07-29: 0.5 ug/min via INTRAVENOUS
  Administered 2021-07-30: 8 ug/min via INTRAVENOUS
  Filled 2021-07-29 (×2): qty 250

## 2021-07-29 MED ORDER — DOCUSATE SODIUM 50 MG/5ML PO LIQD
100.0000 mg | Freq: Two times a day (BID) | ORAL | Status: DC
  Administered 2021-07-30 – 2021-08-02 (×8): 100 mg
  Filled 2021-07-29 (×8): qty 10

## 2021-07-29 MED ORDER — POLYETHYLENE GLYCOL 3350 17 G PO PACK: 17.0000 g | PACK | Freq: Every day | ORAL | Status: DC | PRN

## 2021-07-29 MED ORDER — LACTATED RINGERS IV SOLN
INTRAVENOUS | Status: DC
Start: 2021-07-29 — End: 2021-07-30

## 2021-07-29 MED ORDER — ONDANSETRON HCL 4 MG/2ML IJ SOLN: 4.0000 mg | Freq: Four times a day (QID) | INTRAMUSCULAR | Status: DC | PRN

## 2021-07-29 MED ORDER — SODIUM CHLORIDE 0.9 % IV SOLN
100.0000 mg | Freq: Every day | INTRAVENOUS | Status: DC
  Administered 2021-07-30: 100 mg via INTRAVENOUS
  Filled 2021-07-29: qty 20
  Filled 2021-07-29: qty 100

## 2021-07-29 MED ORDER — FENTANYL CITRATE PF 50 MCG/ML IJ SOSY
50.0000 ug | PREFILLED_SYRINGE | INTRAMUSCULAR | Status: DC | PRN
  Filled 2021-07-29: qty 2

## 2021-07-29 NOTE — Procedures (Signed)
Arterial Catheter Insertion Procedure Note  Mayling [place]  [id number]  [birth year]  Date:08/14/2021  Time:8:56 PM    Provider Performing: Montey Hora    Procedure: Insertion of Arterial Line [phone]) with US guidance ([id number])   Indication(s) Blood pressure monitoring and/or need for frequent ABGs  Consent Risks of the procedure as well as the alternatives and risks of each were explained to the patient and/or caregiver.  Consent for the procedure was obtained and is signed in the bedside chart  Anesthesia None   Time Out Verified patient identification, verified procedure, site/side was marked, verified correct patient position, special equipment/implants available, medications/allergies/relevant history reviewed, required imaging and test results available.   Sterile Technique Maximal sterile technique including full sterile barrier drape, hand hygiene, sterile gown, sterile gloves, mask, hair covering, sterile ultrasound probe cover (if used).   Procedure Description Area of catheter insertion was cleaned with chlorhexidine and draped in sterile fashion. With real-time ultrasound guidance an arterial catheter was placed into the right femoral artery.  Appropriate arterial tracings confirmed on monitor.     Complications/Tolerance None; patient tolerated the procedure well.   EBL Minimal   Specimen(s) None   Montey Hora, PA - [facility] For pager details, please see AMION or use Epic chat  After 1900, please call [facility] for cross coverage needs 08/07/2021, 8:57 PM

## 2021-07-29 NOTE — Progress Notes (Signed)
Pharmacy Antibiotic Note  Kellee French [place] is a 55 y.o. female admitted on 08/03/2021 with aspiration pneumonia.  Pharmacy has been consulted for ampicillin/sulbactam dosing.  Plan: Ampicillin/sulbactam IV 3g q 6h F/u for renal function and LOT  Height: 5\' 5"  (165.1 cm) IBW/kg (Calculated) : 57  Temp (24hrs), Avg:97.6 F (36.4 C), Min:97.6 F (36.4 C), Max:97.6 F (36.4 C)  Recent Labs  Lab 07/31/2021 1226  WBC 9.2  CREATININE 1.52*  LATICACIDVEN 7.2*    Estimated Creatinine Clearance: 39 mL/min (A) (by C-G formula based on SCr of 1.52 mg/dL (H)).    Allergies  Allergen Reactions   Codeine Hives   Effexor [Venlafaxine]    Sulfa Antibiotics Hives   Sulfasalazine Hives and Itching   Wellbutrin [Bupropion]     dizziness    Antimicrobials this admission: Unasyn 11/1 >>  Microbiology results: 11/1 BCx: sent COVID+   Thank you for allowing pharmacy to be a part of this patient's care.  Levonne Spiller 07/30/2021 3:49 PM

## 2021-07-29 NOTE — Progress Notes (Signed)
Pt arrived by [facility] post CPR. EMS intubated with 7.0 ETT. CO2 detector was positive with good color change and clear BBS. RT placed commercial ETT holder and placed pt on mechanical ventilator.

## 2021-07-29 NOTE — Progress Notes (Signed)
EEG monitoring starting at bedside at this time.

## 2021-07-29 NOTE — H&P (Addendum)
NAME:  Tara [name], MRN:  [id number], DOB:  26-Sep-1966, LOS: 0 ADMISSION DATE:  08/10/2021, CONSULTATION DATE:  08/08/2021 REFERRING MD:  Maryan Rued, CHIEF COMPLAINT:  Post Cardiac Arrest   History of Present Illness:  Patient is a 55 year old female with a history of hypertension, recent issues with lower extremity weakness, shaking and recent weight loss presenting today as a cardiac arrest.  Patient's husband and EMS give the history.  Husband reports that over the last month or 2 patient has had worsening lower extremity weakness, shakiness, tingling in her legs and saw neurology for the first time today.  They had ordered an MRI and they had come home and she went inside and sat down.  Her husband went out to talk with the plumber and 5 minutes later came back in and noticed she was unresponsive sitting in the chair.  He did not notice that she was breathing and called 911.  He did start bystander CPR.  The call came in at 1115.  Fire arrived at 1118 and on their arrival they initiated CPR.  AED did suggest shock as patient was in V. fib.  She was shocked x2 when EMS arrived she was still in V. fib received 1 mg of epi and a third shock which put her back into sinus rhythm.  She was intubated with a 7.0 tube and did have spontaneous breaths.  Husband denies patient recently having any illnesses or complaining of chest pain or shortness of breath.  For the last month she had complained of fatigue.  She had still been eating and drinking.  She does not drink alcohol regularly but husband reports she does smoke heavily. CPR x 20 minutes  Upon arrival to the ED patient was Covid +, which was an incidental finding Na 130/ K 4.1 initially, then 5.2, Creatinine 1.52, Alk Phos 148, AST 290/ ALT 132/ Total Protein 5.1/ Troponin 48/ Lactate 7.2  She was intubated in the ED at 12:34 pm, ABG at 14:10 was 7.19/ 35/82/15/13/13.6,  This was on a rate of 16. She was overbreathing the vent. Saturation was 93%  WBC  is 9.2/ HGB is 10.8/ platelets are 73K  CT head was negative for acute stroke / global anoxic damage. 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   Pertinent  Medical History   Past Medical History:  Diagnosis Date   Anxiety state, unspecified    Clavicle fracture    right   Depression    Essential hypertension, benign    History of pneumonia    Heavy smoker  Significant Hospital Events: Including procedures, antibiotic start and stop dates in addition to other pertinent events   08/03/2021 Admission post v fib arrest   Interim History / Subjective:  See H&P Unresponsive , intubated and sedated Currently not on pressors  Objective   Blood pressure 100/60, pulse 94, temperature 97.6 F (36.4 C), temperature source Axillary, resp. rate (!) 27, height 5' 5" (1.651 m), last menstrual period 08/26/2011, SpO2 95 %.    Vent Mode: PRVC FiO2 (%):  [100 %] 100 % Set Rate:  [18 bmp] 18 bmp Vt Set:  [456 mL] 456 mL PEEP:  [5 cmH20] 5 cmH20 Plateau Pressure:  [17 cmH20] 17 cmH20  No intake or output data in the 24 hours ending 08/02/2021 1531 There were no vitals filed  for this visit.  Examination:   Resolved Hospital Problem list     Assessment & Plan:  Post V Fib Arrest Shocked x 3 before ROSC 20 minutes CPR Abnormal EKG Troponin 48 Lactate of 7.2 on arrival to ED pH 7.19, Bicarb 13.6 CXR with pulmonary edema Plan Cardiology consult Continue Amiodarone IV EKG in am and prn  Fluid resuscitation to support end organ perfusion  Trend troponins  Trend BNP Trend Lactate Echo Check TSH Normothermia Protocol has been initiated Check Mag, repelete to 2.0  CTA to Rule out PE Heparin gtt per pharmacy  Respiratory Arrest in setting of Cardiac Arrest Heavy Smoker Covid + ( Incidental Finding on admission )   Plan Repeat ABG 1 hour after most recent vent change , and prn  Adjust vent rate  per ABG results Wean oxygen for sats of > 94% No SBT in am  Unasyn for possible aspiration  CXR in am and prn  Remdesivir as ordered Trend d dimer/ CRP/ Ferritin Xopenex nebs prn   AMS post V fib arrest CT Head with no acute stroke or global anoxia Plan Normothermia protocol as above Frequent neuro checks EEG Consider Neuro consult Minimize sedation as able RASS score -2 Urine drug screen  Hyponatremia Hypocalcemia Creatinine of 1.52>> suspect demand ischemia  Plan Monitor UO Strict I&O Fluid resuscitation  Trend BMET Replete electrolytes as needed   Thrombocytopenia Plan Trend CBC Monitor for bleeding / bruising Transfuse if symptomatic   Best Practice (right click and "Reselect all SmartList Selections" daily)   Diet/type: NPO w/ meds via tube DVT prophylaxis: systemic heparin GI prophylaxis: PPI Lines: N/A Foley:  Yes, and it is still needed Code Status:  full code Last date of multidisciplinary goals of care discussion [NA]  Labs   CBC: Recent Labs  Lab 08/22/2021 1226 08/07/2021 1410  WBC 9.2  --   NEUTROABS 5.2  --   HGB 10.8* 11.6*  HCT 34.3* 34.0*  MCV 107.5*  --   PLT 73*  --     Basic Metabolic Panel: Recent Labs  Lab 08/02/2021 1226 08/05/2021 1410  NA 130* 129*  K 4.1 5.2*  CL 102  --   CO2 14*  --   GLUCOSE 153*  --   BUN 15  --   CREATININE 1.52*  --   CALCIUM 8.0*  --    GFR: Estimated Creatinine Clearance: 39 mL/min (A) (by C-G formula based on SCr of 1.52 mg/dL (H)). Recent Labs  Lab 08/01/2021 1226  WBC 9.2  LATICACIDVEN 7.2*    Liver Function Tests: Recent Labs  Lab 07/31/2021 1226  AST 298*  ALT 132*  ALKPHOS 148*  BILITOT 0.4  PROT 5.1*  ALBUMIN 2.8*   No results for input(s): LIPASE, AMYLASE in the last 168 hours. No results for input(s): AMMONIA in the last 168 hours.  ABG    Component Value Date/Time   PHART 7.198 (LL)  08/15/2021 1410   PCO2ART 35.1 08/07/2021 1410   PO2ART 82 (L) 08/06/2021 1410   HCO3 13.6 (L) 08/19/2021 1410   TCO2 15 (L) 07/30/2021 1410   ACIDBASEDEF 13.0 (H) 08/22/2021 1410   O2SAT 93.0 08/20/2021 1410     Coagulation Profile: No results for input(s): INR, PROTIME in the last 168 hours.  Cardiac Enzymes: No results for input(s): CKTOTAL, CKMB, CKMBINDEX, TROPONINI in the last 168 hours.  HbA1C: No results found for: HGBA1C  CBG: No results for input(s): GLUCAP in the last 168 hours.  Review of  Systems:   See H&P  Past Medical History:  She,  has a past medical history of Anxiety state, unspecified, Clavicle fracture, Depression, Essential hypertension, benign, and History of pneumonia.   Surgical History:   Past Surgical History:  Procedure Laterality Date   BREAST SURGERY Left    lumpectomy   EYE SURGERY     left   ORIF CLAVICULAR FRACTURE Right 03/05/2016   Procedure: Right open reduction internal fixation clavical fracture ;  Surgeon: Tania Ade, MD;  Location: [facility];  Service: Orthopedics;  Laterality: Right;     Social History:   reports that she has been smoking cigarettes. She has a 20.00 pack-year smoking history. She has never used smokeless tobacco. She reports current alcohol use of about 7.0 standard drinks per week. She reports that she does not use drugs.   Family History:  Her family history includes Breast cancer in her mother and paternal grandmother; Colon cancer in her paternal grandfather; Diabetes in her mother; Hypertension in her father and mother; Ovarian cancer in her maternal grandmother; Prostate cancer in her father; Stomach cancer in her cousin; Stroke in her maternal grandfather.   Allergies Allergies  Allergen Reactions   Codeine Hives   Effexor [Venlafaxine]    Sulfa Antibiotics Hives   Sulfasalazine Hives and Itching   Wellbutrin [Bupropion]     dizziness     Home Medications  Prior to Admission  medications   Medication Sig Start Date End Date Taking? Authorizing Provider  atenolol (TENORMIN) 100 MG tablet Take 100 mg by mouth daily.   Yes [provider]  ergocalciferol (VITAMIN D2) 1.25 MG (50000 UT) capsule Take 50,000 Units by mouth once a week. 06/06/21  Yes [provider]  EYLEA 2 MG/0.05ML SOSY Place into the left eye every 3 (three) months. 07/22/21  Yes [provider]  hydrOXYzine (ATARAX/VISTARIL) 25 MG tablet Take 1 tablet by mouth 3 (three) times daily as needed for anxiety. 04/15/20  Yes [provider]  losartan (COZAAR) 100 MG tablet Take 100 mg by mouth daily.   Yes [provider]  magnesium oxide (MAG-OX) 400 (240 Mg) MG tablet Take 1 tablet by mouth daily. 07/01/21  Yes [provider]  PARoxetine (PAXIL) 20 MG tablet Take 20 mg by mouth in the morning and at bedtime.   Yes [provider]  rosuvastatin (CRESTOR) 20 MG tablet Take 20 mg by mouth daily.   Yes [provider]  traMADol (ULTRAM) 50 MG tablet Take 50 mg by mouth every 6 (six) hours as needed for moderate pain or severe pain.   Yes [provider]  oxyCODONE-acetaminophen (ROXICET) 5-325 MG tablet Take 1-2 tablets by mouth every 4 (four) hours as needed for severe pain. Patient not taking: No sig reported 03/05/16   Grier Mitts, PA-C     Magdalen Spatz, MSN, AGACNP-BC [place] for personal pager PCCM on call pager [phone]  07/31/2021 77:58 PM   55 year old heavy smoker brought in by EMS as a cardiac arrest .  She went to neurology clinic for weakness in lower extremities , impression was rule out cervical myelopathy and MRI C-spine was planned.  After returning home, husband found her unresponsive in the chair, after fire arrival, AED suggested shock, regained rhythm after third shock after epi x1 given, intubated in the field , total downtime of 20 minutes Initial labs in  the ED significant for creatinine 1.5, elevated LFTs, slightly elevated  troponin, lactate 7.2 with metabolic acidosis on ABG and severe hypoxia Chest x-ray independently reviewed which showed interstitial prominence and ET tube at carina, pulled back 2 cm Noted to be COVID-positive  On exam -unresponsive, biting on ET tube, pupils mid dilated, unequal right 3 mm, left 2 mm, not reactive to light, roving eyes does not follow commands, GCS 3, S1-S2 tacky, flail chest with BL scattered rhonchi,soft nontender abdomen, left leg IO, purple feet and toes  EKG independently reviewed, no ST-T changes  Impression/plan V. fib arrest -appears to be primary cardiac etiology, started on amiodarone IV, We will add heparin drip for elevated troponins, obtain cardiology input. CT angiogram results pending to rule out PE , although less likely that this would cause V. 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 Will need MRI C-spine for previous neurological suspicion for cervical myelopathy Using fentanyl as needed for sedation and propofol if needed with goal RASS 0 to -1  Acute respiratory failure -vent settings reviewed and adjusted Flail chest , rib fractures from CPR- add PEEP 10 Metabolic acidosis likely due to lactate which expect to clear she is perfusing  COVID-positive -likely incidental, she is vaccinated, use Unasyn for possible aspiration, obtain respiratory culture for completion. We will treat with remdesivir but hold off on steroids unless no other cause of hypoxia identified  Ischemic appearing toes -due to low perfusion, smoker may have peripheral vascular disease, obtain arterial Dopplers   My independent critical care time x 29m RKara MeadMD. FCCP.  Pulmonary & Critical care Pager : [phone]  If no response to pager , please call [phone] until 7 pm After 7:00 pm call Elink  [phone]   08/16/2021

## 2021-07-29 NOTE — Telephone Encounter (Signed)
LVM for pt to call back to schedule  [facility] auth: [id number] (exp. 08/22/2021 to 09/27/21)

## 2021-07-29 NOTE — Progress Notes (Signed)
Per CCM order, ETT was pulled back 2 cm. ETT now 23 at the lip.

## 2021-07-29 NOTE — Progress Notes (Signed)
[facility] Progress Note Patient Name: Tara [name] DOB: Dec 20, 1965 MRN: [id number]   Date of Service  08/16/2021  HPI/Events of Note  - lactate up to 7.6 - pt now on levophed; art line inserted; already had a central venous catheter  eICU Interventions  - will continue current course - on maintenance fluids; on vasopressors     Intervention Category Intermediate Interventions: Diagnostic test evaluation  [place]en Dome 08/10/2021, 9:57 PM

## 2021-07-29 NOTE — Progress Notes (Signed)
RT assisted with transportation of this pt from ED22 to CT and back while on full ventilatory support. Pt tolerated well with SVS and no complications.

## 2021-07-29 NOTE — H&P (Signed)
CONSULTATION   Patient ID: Tara [name] MRN: [id number]; DOB: 08-Dec-1965   Admission date: 08/25/2021  PCP:  Heywood Bene, PA-C   [facility] Providers Cardiologist:  None   None     Chief Complaint: Ventricular fibrillation cardiac arrest  Patient Profile:   Tara [name] is a 55 y.o. [name] with recent h/o fatigue and weakness who is being seen 08/02/2021 for the evaluation of ventricular fibrillation cardiac arrest with ROSC after ~20 minutes. Lactic acid > 7.0, and ECG without acute ischemic changes. Noted Covid 19 positive in ER.Marland Kitchen  History of Present Illness:   Tara [name] is a 55 year old [name] who suffered a ventricular fibrillation cardiac arrest at home.  CPR was started by her husband.  There is a 30 to 60-day history of fatigue, weakness, and tingling in her feet.  She was seen in the neurology office today and after arriving home from the appointment, she was found by her husband not breathing.  CPR was started by her husband, the fire department arrived and delivered 2 shocks based upon algorithm, and slightly later EMS arrived, found her in VF, and did an additional shock with return to normal sinus rhythm.  Time to ROSC 20 minutes.  Husband is not available.  Found to be COVID-positive after being swabbed in the emergency department.  Apparently not ill prior respiratory complaints.  She is a positive smoker.  She has history of depression.  She has coronary artery calcification noted on chest CT.  Outpatient medications include rosuvastatin, losartan, and Tenormin.  She uses Atarax 25 mg 3 times per day for anxiety.  She also uses oxycodone/acetaminophen as needed for pain.Marland Kitchen  Neurology note from earlier today did not seem to demonstrate any evidence of infectious disease.  There were no cardiopulmonary complaints identified.   Past Medical History:  Diagnosis Date   Anxiety state, unspecified    Clavicle fracture    right   Depression    Essential  hypertension, benign    History of pneumonia     Past Surgical History:  Procedure Laterality Date   BREAST SURGERY Left    lumpectomy   EYE SURGERY     left   ORIF CLAVICULAR FRACTURE Right 03/05/2016   Procedure: Right open reduction internal fixation clavical fracture ;  Surgeon: Tania Ade, MD;  Location: [place];  Service: Orthopedics;  Laterality: Right;     Medications Prior to Admission: Prior to Admission medications   Medication Sig Start Date End Date Taking? Authorizing Provider  atenolol (TENORMIN) 100 MG tablet Take 100 mg by mouth daily.   Yes [provider]  ergocalciferol (VITAMIN D2) 1.25 MG (50000 UT) capsule Take 50,000 Units by mouth once a week. 06/06/21  Yes [provider]  EYLEA 2 MG/0.05ML SOSY Place into the left eye every 3 (three) months. 07/22/21  Yes [provider]  hydrOXYzine (ATARAX/VISTARIL) 25 MG tablet Take 1 tablet by mouth 3 (three) times daily as needed for anxiety. 04/15/20  Yes [provider]  losartan (COZAAR) 100 MG tablet Take 100 mg by mouth daily.   Yes [provider]  magnesium oxide (MAG-OX) 400 (240 Mg) MG tablet Take 1 tablet by mouth daily. 07/01/21  Yes [provider]  PARoxetine (PAXIL) 20 MG tablet Take 20 mg by mouth in the morning and at bedtime.   Yes [provider]  rosuvastatin (CRESTOR) 20 MG tablet Take 20 mg by mouth daily.   Yes [provider]  traMADol (  ULTRAM) 50 MG tablet Take 50 mg by mouth every 6 (six) hours as needed for moderate pain or severe pain.   Yes [provider]  oxyCODONE-acetaminophen (ROXICET) 5-325 MG tablet Take 1-2 tablets by mouth every 4 (four) hours as needed for severe pain. Patient not taking: No sig reported 03/05/16   Grier Mitts, PA-C     Allergies:    Allergies  Allergen Reactions   Codeine Hives   Effexor [Venlafaxine]    Sulfa Antibiotics Hives   Sulfasalazine Hives and  Itching   Wellbutrin [Bupropion]     dizziness    Social History:   Social History   Socioeconomic History   Marital status: Single    Spouse name: Not on file   Number of children: 0   Years of education: Not on file   Highest education level: Not on file  Occupational History   Occupation: student  Tobacco Use   Smoking status: Every Day    Packs/day: 1.00    Years: 20.00    Pack years: 20.00    Types: Cigarettes   Smokeless tobacco: Never  Substance and Sexual Activity   Alcohol use: Yes    Alcohol/week: 7.0 standard drinks    Types: 7 Glasses of wine per week    Comment: social   Drug use: No   Sexual activity: Not Currently  Other Topics Concern   Not on file  Social History Narrative   Not on file   Social Determinants of Health   Financial Resource Strain: Not on file  Food Insecurity: Not on file  Transportation Needs: Not on file  Physical Activity: Not on file  Stress: Not on file  Social Connections: Not on file  Intimate Partner Violence: Not on file    Family History:   The patient's family history includes Breast cancer in her mother and paternal grandmother; Colon cancer in her paternal grandfather; Diabetes in her mother; Hypertension in her father and mother; Ovarian cancer in her maternal grandmother; Prostate cancer in her father; Stomach cancer in her cousin; Stroke in her maternal grandfather.    ROS:  Please see the history of present illness.  Bilateral lower extremity weakness progressive since September 2022.  All other ROS reviewed and negative.     Physical Exam/Data:   Vitals:   08/07/2021 1445 08/25/2021 1558 08/16/2021 1630 08/15/2021 1635  BP: 100/60  (!) 76/61 (!) 85/68  Pulse: 94 (!) 107    Resp: (!) 27 (!) 35 (!) 35 (!) 36  Temp:      TempSrc:      SpO2: 95% 95%    Height:       No intake or output data in the 24 hours ending 08/11/2021 1648 Last 3 Weights 08/16/2021 03/05/2016 03/04/2016  Weight (lbs) 139 lb 173 lb 162 lb  Weight  (kg) 63.05 kg 78.472 kg 73.483 kg     Body mass index is 23.13 kg/m.  General:  Well nourished, well developed, intubated and appearing older than stated age. HEENT: normal Neck: no obvious JVD Vascular: No carotid bruits; Distal pulses are not palpable. Cardiac:  normal S1, S2; RRR; no murmur.  Exam difficult because of endotracheal tube. Lungs:  clear to auscultation bilaterally, no wheezing, rhonchi or rales  Abd: soft, nontender, no hepatomegaly  Ext: Reveals blue toes right leg greater than left.  Pedal pulses are not palpable.  Edema Musculoskeletal:  No deformities, BUE and BLE strength normal and equal Skin: warm and dry  Neuro:  Sedated with some spontaneous movement not felt to be purposeful. Psych: Unable to assess   EKG:  The ECG that was done 08/03/2021 was personally reviewed and demonstrates NSR with prolonged QT (506 msec)  Relevant CV Studies: No prior echocardiogram  CT chest with contrast 08/01/2021:IMPRESSION: IMPRESSION: 1. No convincing pulmonary embolism. Study degraded by respiratory motion. 2. Lower lobes nearly opacified, likely a significant component, if not completely, atelectasis. 3. Additional patchy areas airspace opacities in both upper lobes and the right middle lobe, most evident in the left upper lobe, consistent with multifocal pneumonia, possibly due to areas of edema or lung contusion from CPR. 4. Interstitial thickening.  Suspect interstitial edema. 5. Anterior rib fractures, which are most evident on the right with a appear recent/acute. Left-sided rib fractures may be chronic or subacute. Right-sided fractures may be from CPR. 6. Well-positioned endotracheal tube.   Aortic Atherosclerosis (ICD10-I70.0).    Laboratory Data:  High Sensitivity Troponin:   Recent Labs  Lab 08/12/2021 1226 08/03/2021 1426  TROPONINIHS 48* 335*      Chemistry Recent Labs  Lab 08/07/2021 1226 08/09/2021 1410  NA 130* 129*  K 4.1 5.2*  CL 102  --    CO2 14*  --   GLUCOSE 153*  --   BUN 15  --   CREATININE 1.52*  --   CALCIUM 8.0*  --   GFRNONAA 41*  --   ANIONGAP 14  --     Recent Labs  Lab 08/25/2021 1226  PROT 5.1*  ALBUMIN 2.8*  AST 298*  ALT 132*  ALKPHOS 148*  BILITOT 0.4   Lipids No results for input(s): CHOL, TRIG, HDL, LABVLDL, LDLCALC, CHOLHDL in the last 168 hours. Hematology Recent Labs  Lab 08/01/2021 1226 08/16/2021 1410  WBC 9.2  --   RBC 3.19*  --   HGB 10.8* 11.6*  HCT 34.3* 34.0*  MCV 107.5*  --   MCH 33.9  --   MCHC 31.5  --   RDW 11.9  --   PLT 73*  --    Thyroid No results for input(s): TSH, FREET4 in the last 168 hours. BNPNo results for input(s): BNP, PROBNP in the last 168 hours.  DDimer No results for input(s): DDIMER in the last 168 hours.   Radiology/Studies:  CT Head Wo Contrast  Result Date: 08/12/2021 CLINICAL DATA:  Altered mental status, status post cardiac arrest EXAM: CT HEAD WITHOUT CONTRAST TECHNIQUE: Contiguous axial images were obtained from the base of the skull through the vertex without intravenous contrast. COMPARISON:  02/28/2016 FINDINGS: Brain: A 0.7 by 0.5 cm hypodensity favoring chronic lacunar infarct is present in the right periventricular white matter on image 18 series 3. This has a chronic appearance, but was not present on the prior CT scan from 02/28/2016. There is well maintained gray-white differentiation with continued normal conspicuity of the basal ganglia and thalami. Mild progressive sulcal prominence particularly along the vertex, mildly advanced for age. No intracranial hemorrhage, mass lesion, or acute CVA. Vascular: There is atherosclerotic calcification of the cavernous carotid arteries bilaterally. Skull: Unremarkable Sinuses/Orbits: Chronic mucosal thickening in the maxillary sinuses, ethmoid air cells, and sphenoid sinuses noted compatible with chronic sinusitis. There are air fluid levels in the sphenoid sinuses which could reflect superimposed acute  sinusitis but may also be alternatively related to the patient's intubation. Other: No supplemental non-categorized findings. IMPRESSION: 1. No compelling findings of acute stroke/global anoxic injury. No intracranial hemorrhage or acute intracranial findings. 2. A small chronic lacunar infarct  in the right periventricular white matter is noted. Although this has chronic characteristics, it was not present on the 02/28/2016 CT scan. 3. Mildly age advanced cerebral atrophy manifesting as sulcal prominence along the vertex. 4. Chronic paranasal sinusitis. Air-fluid levels in the sphenoid sinuses could be from intubation or acute sinusitis. Electronically Signed   By: Van Clines M.D.   On: 08/15/2021 13:42   DG Chest Port 1 View  Result Date: 08/12/2021 CLINICAL DATA:  Post CPR EXAM: PORTABLE CHEST 1 VIEW COMPARISON:  September 2015 FINDINGS: Low lung volumes. Pulmonary vascular congestion with edema. No pleural effusion. No pneumothorax. Cardiomediastinal contours are within normal limits. Endotracheal tube is just above the carina and retraction is recommended. IMPRESSION: Endotracheal tube just above carina.  Retraction recommended. Pulmonary vascular congestion with edema. Electronically Signed   By: Macy Mis M.D.   On: 08/26/2021 12:43     Assessment and Plan:   Ventricular fibrillation cardiac arrest: ROSC after 20 minutes of CPR.  EKG is nonischemic in appearance.  Lactic acid greater than 7. Coronary artery disease: Coronary calcification noted on chest CT.  Elevated troponin I increasing from 40 to 350.  ECG does not demonstrate acute ischemic change.  QT is somewhat prolonged.  We will repeat EKG and continue to cycle markers.  2D Doppler echocardiogram to assess LV function and rule out cardiomyopathy as a substrate for VF. COVID-19 positive Abnormal chest CT postarrest: Possible pulmonary edema versus sequela of COVID-19 infection.   Cardiology plan at this time is conservative  management awaiting for neuro recovery as well as generalized improvement.  In setting of a nonischemic EKG and with significant elevation in lactic acid intervention/invasive approach is not recommended.  Will continue to follow.    Severity of Illness: The appropriate patient status for this patient is INPATIENT. Inpatient status is judged to be reasonable and necessary in order to provide the required intensity of service to ensure the patient's safety. The patient's presenting symptoms, physical exam findings, and initial radiographic and laboratory data in the context of their chronic comorbidities is felt to place them at high risk for further clinical deterioration. Furthermore, it is not anticipated that the patient will be medically stable for discharge from the hospital within 2 midnights of admission.   * I certify that at the point of admission it is my clinical judgment that the patient will require inpatient hospital care spanning beyond 2 midnights from the point of admission due to high intensity of service, high risk for further deterioration and high frequency of surveillance required.*   For questions or updates, please contact [place] Please consult [email or web address] for contact info under     Signed, Sinclair Grooms, MD  07/30/2021 4:48 PM

## 2021-07-29 NOTE — ED Provider Notes (Signed)
[facility] EMERGENCY DEPARTMENT Provider Note   CSN: [id number] Arrival date & time: 08/06/2021  1217     History Chief Complaint  Patient presents with   Cardiac Arrest    Tara [name] is a 55 y.o. female.  Patient is a 55 year old female with a history of hypertension, recent issues with lower extremity weakness, shaking and recent weight loss presenting today as a cardiac arrest.  Patient's husband and EMS give the history.  Husband reports that over the last month or 2 patient has had worsening lower extremity weakness, shakiness, tingling in her legs and saw neurology for the first time today.  They had ordered an MRI and they had come home and she went inside and sat down.  Her husband went out to talk with the plumber and 5 minutes later came back in and noticed she was unresponsive sitting in the chair.  He did not notice that she was breathing and called 911.  He did start bystander CPR.  The call came in at 1115.  Fire arrived at 1118 and on their arrival they initiated CPR.  AED did suggest shock as patient was in V. fib.  She was shocked x2 when EMS arrived she was still in V. fib received 1 mg of epi and a third shock which put her back into sinus rhythm.  She was intubated with a 7.0 tube and did have spontaneous breaths.  Husband denies patient recently having any illnesses or complaining of chest pain or shortness of breath.  For the last month she had complained of fatigue.  She had still been eating and drinking.  She does not drink alcohol regularly but husband reports she does smoke heavily.  The history is provided by the patient. The history is limited by the condition of the patient.  Cardiac Arrest     Past Medical History:  Diagnosis Date   Anxiety state, unspecified    Clavicle fracture    right   Depression    Essential hypertension, benign    History of pneumonia     Patient Active Problem List   Diagnosis Date Noted   Gait abnormality  08/09/2021   Paresthesia 08/05/2021   Sensory disturbance 06/14/2014   Breast lump on left side at 3 o'clock position 08/09/2012   Breast lump on right side at 10 o'clock position 08/09/2012   Vaginal discharge 08/09/2012   HTN (hypertension) 03/25/2012    Past Surgical History:  Procedure Laterality Date   BREAST SURGERY Left    lumpectomy   EYE SURGERY     left   ORIF CLAVICULAR FRACTURE Right 03/05/2016   Procedure: Right open reduction internal fixation clavical fracture ;  Surgeon: Tania Ade, MD;  Location: [place];  Service: Orthopedics;  Laterality: Right;     OB History     Gravida  1   Para      Term      Preterm      AB  1   Living  0      SAB      IAB  1   Ectopic      Multiple      Live Births              Family History  Problem Relation Age of Onset   Breast cancer Mother    Diabetes Mother        pre-diabetes   Hypertension Mother    Prostate cancer Father  Hypertension Father    Ovarian cancer Maternal Grandmother    Stroke Maternal Grandfather    Breast cancer Paternal Grandmother    Colon cancer Paternal Grandfather    Stomach cancer Cousin     Social History   Tobacco Use   Smoking status: Every Day    Packs/day: 1.00    Years: 20.00    Pack years: 20.00    Types: Cigarettes   Smokeless tobacco: Never  Substance Use Topics   Alcohol use: Yes    Alcohol/week: 7.0 standard drinks    Types: 7 Glasses of wine per week    Comment: social   Drug use: No    Home Medications Prior to Admission medications   Medication Sig Start Date End Date Taking? Authorizing Provider  atenolol (TENORMIN) 100 MG tablet Take 100 mg by mouth daily.   Yes [provider]  ergocalciferol (VITAMIN D2) 1.25 MG (50000 UT) capsule Take 50,000 Units by mouth once a week. 06/06/21  Yes [provider]  hydrOXYzine (ATARAX/VISTARIL) 25 MG tablet Take 1 tablet by mouth 3 (three) times daily as needed for  anxiety. 04/15/20  Yes [provider]  losartan (COZAAR) 100 MG tablet Take 100 mg by mouth daily.   Yes [provider]  magnesium oxide (MAG-OX) 400 (240 Mg) MG tablet Take 1 tablet by mouth daily. 07/01/21  Yes [provider]  PARoxetine (PAXIL) 20 MG tablet Take 20 mg by mouth in the morning and at bedtime.   Yes [provider]  rosuvastatin (CRESTOR) 20 MG tablet Take 20 mg by mouth daily.   Yes [provider]  traMADol (ULTRAM) 50 MG tablet Take 50 mg by mouth every 6 (six) hours as needed for moderate pain or severe pain.   Yes [provider]  EYLEA 2 MG/0.05ML SOSY Place into the left eye every 3 (three) months. 07/22/21   [provider]  oxyCODONE-acetaminophen (ROXICET) 5-325 MG tablet Take 1-2 tablets by mouth every 4 (four) hours as needed for severe pain. Patient not taking: Reported on 08/21/2021 03/05/16   Grier Mitts, PA-C    Allergies    Codeine, Effexor [venlafaxine], Sulfa antibiotics, Sulfasalazine, and Wellbutrin [bupropion]  Review of Systems   Review of Systems  Unable to perform ROS: Acuity of condition   Physical Exam Updated Vital Signs BP 104/79   Pulse 72   Temp 97.6 F (36.4 C) (Axillary)   Resp (!) 29   Ht 5\' 5"  (1.651 m)   LMP 08/26/2011   SpO2 100%   BMI 23.13 kg/m   Physical Exam Vitals and nursing note reviewed.  Constitutional:      General: She is not in acute distress.    Appearance: She is well-developed. She is toxic-appearing.  HENT:     Head: Normocephalic and atraumatic.     Mouth/Throat:     Mouth: Mucous membranes are moist.  Eyes:     Conjunctiva/sclera: Conjunctivae normal.     Comments: 4 mm sluggishly reactive bilaterally  Cardiovascular:     Rate and Rhythm: Normal rate and regular rhythm.     Heart sounds: No murmur heard. Pulmonary:     Effort: Pulmonary effort is normal. No respiratory distress.     Breath sounds: Rales present. No wheezing.      Comments: Rales bilaterally Abdominal:     General: There is no distension.     Palpations: Abdomen is soft.     Tenderness: There is no abdominal tenderness. There is  no guarding or rebound.  Musculoskeletal:        General: No tenderness. Normal range of motion.     Cervical back: Normal range of motion and neck supple.  Skin:    General: Skin is warm and dry.     Findings: No erythema or rash.     Comments: Cyanotic earlobes, feet and hands  Neurological:     Comments: GCS of 3 on the vent.  She is initiating breaths but otherwise unresponsive.  At times stiffening fingers but no specific decorticate or decerebrate movement    ED Results / Procedures / Treatments   Labs (all labs ordered are listed, but only abnormal results are displayed) Labs Reviewed  RESP PANEL BY RT-PCR (FLU A&B, COVID) ARPGX2 - Abnormal; Notable for the following components:      Result Value   SARS Coronavirus 2 by RT PCR POSITIVE (*)    All other components within normal limits  CBC WITH DIFFERENTIAL/PLATELET - Abnormal; Notable for the following components:   RBC 3.19 (*)    Hemoglobin 10.8 (*)    HCT 34.3 (*)    MCV 107.5 (*)    Platelets 73 (*)    nRBC 2 (*)    Abs Immature Granulocytes 0.20 (*)    All other components within normal limits  ACETAMINOPHEN LEVEL - Abnormal; Notable for the following components:   Acetaminophen (Tylenol), Serum <10 (*)    All other components within normal limits  SALICYLATE LEVEL - Abnormal; Notable for the following components:   Salicylate Lvl <1.4 (*)    All other components within normal limits  LACTIC ACID, PLASMA - Abnormal; Notable for the following components:   Lactic Acid, Venous 7.2 (*)    All other components within normal limits  I-STAT ARTERIAL BLOOD GAS, ED - Abnormal; Notable for the following components:   pH, Arterial 7.198 (*)    pO2, Arterial 82 (*)    Bicarbonate 13.6 (*)    TCO2 15 (*)    Acid-base deficit 13.0 (*)    Sodium 129 (*)     Potassium 5.2 (*)    Calcium, Ion 1.13 (*)    HCT 34.0 (*)    Hemoglobin 11.6 (*)    All other components within normal limits  TROPONIN I (HIGH SENSITIVITY) - Abnormal; Notable for the following components:   Troponin I (High Sensitivity) 48 (*)    All other components within normal limits  COMPREHENSIVE METABOLIC PANEL   Sodium 431 (*)    CO2 14 (*)    Glucose, Bld 153 (*)    Creatinine, Ser 1.52 (*)    Calcium 8.0 (*)    Total Protein 5.1 (*)    Albumin 2.8 (*)    AST 298 (*)    ALT 132 (*)    Alkaline Phosphatase 148 (*)    GFR, Estimated 41 (*)   ETHANOL  URINALYSIS, ROUTINE W REFLEX MICROSCOPIC  RAPID URINE DRUG SCREEN, HOSP PERFORMED  TROPONIN I (HIGH SENSITIVITY)    EKG EKG Interpretation  Date/Time:  Tuesday July 29 2021 12:54:03 EDT Ventricular Rate:  73 PR Interval:  169 QRS Duration: 102 QT Interval:  460 QTC Calculation: 504 R Axis:   -28 Text Interpretation: Sinus rhythm Multiple ventricular premature complexes Aberrant conduction of SV complex(es) Borderline left axis deviation Abnormal R-wave progression, early transition Repol abnrm suggests ischemia, inferior leads Confirmed by Blanchie Dessert ([id number]) on 08/14/2021 1:04:53 PM  Radiology DG Chest Port 1 View  Result Date: 08/14/2021  CLINICAL DATA:  Post CPR EXAM: PORTABLE CHEST 1 VIEW COMPARISON:  September 2015 FINDINGS: Low lung volumes. Pulmonary vascular congestion with edema. No pleural effusion. No pneumothorax. Cardiomediastinal contours are within normal limits. Endotracheal tube is just above the carina and retraction is recommended. IMPRESSION: Endotracheal tube just above carina.  Retraction recommended. Pulmonary vascular congestion with edema. Electronically Signed   By: Macy Mis M.D.   On: 08/17/2021 12:43    Procedures Procedures   Medications Ordered in ED Medications  lactated ringers bolus 1,000 mL (1,000 mLs Intravenous New Bag/Given 08/23/2021 1259)    ED Course  I  have reviewed the triage vital signs and the nursing notes.  Pertinent labs & imaging results that were available during my care of the patient were reviewed by me and considered in my medical decision making (see chart for details).    MDM Rules/Calculators/A&P                           55 year old female with a history of hypertension and heavy tobacco use but no known cardiac history presenting today after cardiac arrest.  Patient had had recent weight loss, weakness and numbness in her lower extremities.  She did see a neurologist for this today but then had this event.  She had no complaints prior to this event.  Patient was in V. fib shocked x3 and received 1 mg of epi.  Patient has been in sinus rhythm since arrival to the emergency room.  Initially was being supported with epinephrine but that was discontinued and blood pressure has remained in the low 100s.  She is initiating breaths but has no other meaningful response at this time.  He does have cyanotic extremities but has good femoral pulses and thready radial pulses bilaterally.  EKG without evidence of STEMI at this time.  Concern for metabolic cause versus PE versus ACS.  Patient is oxygenating well.  Chest x-ray shows pulmonary vascular congestion and edema with ET tube above the carina.  2:35 PM Patient is starting to become agitated and biting on the tube.  Sedation protocol was initiated.  ABG shows acidosis with a pH of 7.19 and a bicarb of 13, COVID is positive with negative flu, CBC with hemoglobin of 10 but otherwise normal white count and decreased platelets of 73, CMP with creatinine of 1.52, ALT of 132 and normal potassium and sodium, troponin is elevated at 48 and lactic acid is 7.2, head CT without acute findings.  Patient's blood pressure is now elevating due to agitation.  Will do CT to rule out PE and will plan on admission.  MDM   Amount and/or Complexity of Data Reviewed Clinical lab tests: ordered and  reviewed Tests in the radiology section of CPT: ordered and reviewed Tests in the medicine section of CPT: ordered and reviewed Decide to obtain previous medical records or to obtain history from someone other than the patient: yes Obtain history from someone other than the patient: yes Review and summarize past medical records: yes Discuss the patient with other providers: yes Independent visualization of images, tracings, or specimens: yes   CRITICAL CARE Performed by: Rhetta Cleek Total critical care time: 30 minutes Critical care time was exclusive of separately billable procedures and treating other patients. Critical care was necessary to treat or prevent imminent or life-threatening deterioration. Critical care was time spent personally by me on the following activities: development of treatment plan with patient and/or surrogate as  well as nursing, discussions with consultants, evaluation of patient's response to treatment, examination of patient, obtaining history from patient or surrogate, ordering and performing treatments and interventions, ordering and review of laboratory studies, ordering and review of radiographic studies, pulse oximetry and re-evaluation of patient's condition.   Final Clinical Impression(s) / ED Diagnoses Final diagnoses:  Cardiac arrest [facility])  Ventricular fibrillation [facility])    Rx / DC Orders ED Discharge Orders     None        Blanchie Dessert, MD 07/30/21 [id number]

## 2021-07-29 NOTE — Procedures (Signed)
Central Venous Catheter Insertion Procedure Note  Tara [name]  [id number]  October 12, 1965  Date:08/25/2021  Time:6:42 PM   Provider Performing:Tashema Tiller V. Elsworth Soho   Procedure: Insertion of Non-tunneled Central Venous [phone]) with US guidance ([id number])   Indication(s) Difficult access  Consent Risks of the procedure as well as the alternatives and risks of each were explained to the patient and/or caregiver.  Consent for the procedure was obtained and is signed in the bedside chart  Anesthesia Topical only with 1% lidocaine   Timeout Verified patient identification, verified procedure, site/side was marked, verified correct patient position, special equipment/implants available, medications/allergies/relevant history reviewed, required imaging and test results available.  Sterile Technique Maximal sterile technique including full sterile barrier drape, hand hygiene, sterile gown, sterile gloves, mask, hair covering, sterile ultrasound probe cover (if used).  Procedure Description Area of catheter insertion was cleaned with chlorhexidine and draped in sterile fashion.  With real-time ultrasound guidance a central venous catheter was placed into the right internal jugular vein. Nonpulsatile blood flow and easy flushing noted in all ports.  The catheter was sutured in place and sterile dressing applied.  Complications/Tolerance None; patient tolerated the procedure well. Chest X-ray is ordered to verify placement for internal jugular or subclavian cannulation.   Chest x-ray is not ordered for femoral cannulation.  EBL Minimal  Specimen(s) None  Benford Asch V. Elsworth Soho MD

## 2021-07-29 NOTE — Progress Notes (Signed)
EEG complete - results pending 

## 2021-07-29 NOTE — Progress Notes (Signed)
  I came to the ER to do the echo but she is going to 42m sometime  Elmer Ramp 08/15/2021, 4:24 PM

## 2021-07-29 NOTE — ED Triage Notes (Signed)
Patient brought in via ems from home. Patient saw neuro today for tingling in legs. Husband went outside for a few min after returning home, cam inside to find patient unresponsive in chair. CPR started. On arrival, fire sattes patient was VFIB, 2 shocks administered. EMS administered 1 more shock back to NSR.  1epi ivp 300amnio Epi drip at 92mcg/min on arrival.  ETT in place from EMS.

## 2021-07-29 NOTE — Progress Notes (Signed)
  CT angiogram chest reviewed shows bibasal consolidation, bilateral fractured ribs as would be expected with CPR, no evidence of PE Bedside echo reviewed shows EF down to 30%, no wall motion abnormality Slightly elevated troponins, discussed with cardiology, start IV heparin Obtain detailed history from husband -purple toes have been noted in the past She is 1/2 pack/day smoker and a heavy drinker Drop blood pressure with IV propofol, will use IV fentanyl instead for vent synchrony, added PEEP of 10 with improvement in oxygen saturation Central line obtained  Guarded prognosis given to husband  Additional critical care time independent of procedure x 30 m  Aerabella Galasso V. Elsworth Soho MD

## 2021-07-29 NOTE — Progress Notes (Signed)
Chief Complaint  Patient presents with   New Patient (Initial Visit)    New rm, with husband, leg weakness, involuntary movements, hyperreflexia, difficulty walking, uses cane, reports no falls       ASSESSMENT AND PLAN  Tara [name] is a 55 y.o. female  Gradual onset gait abnormality with sudden worsening since September 2022  Hyperreflexia of bilateral upper and lower extremity with nonsustained ankle clonus, bilateral Babinski signs,  Need to rule out cervical pathology  MRI of cervical spine   DIAGNOSTIC DATA (LABS, IMAGING, TESTING) - I reviewed patient records, labs, notes, testing and imaging myself where available.  Laboratory evaluation in September 2022 from [facility]: Vitamin D level was 2022, B12 was 331, normal CMP, creatinine 1.29, slight anemia with hemoglobin of 12.2, elevated MCV of 107.9, negative ANA, Lyme antibody was negative  MEDICAL HISTORY:  Tara [name], is a 55 year old female, seen in request by her primary care PA Clyde Lundborg for evaluation of gait abnormality, she is accompanied by her husband at today's visit July 29, 2021  I reviewed and summarized the referring note.PMhx. HTN Depression  Husband noticed she has gradual change of her walking posture, sudden worsening since September 2022, patient complains of her left seems to be weak, gives out underneath her, also intermittent bilateral toes numbness, occasionally she felt lower extremity and even bilateral arm shaking  She denies bowel and bladder incontinence, there was no significant neck pain,  PHYSICAL EXAM:   Vitals:   08/12/2021 1006  BP: 106/67  Pulse: 61  Weight: 139 lb (63 kg)  Height: 5\' 5"  (1.651 m)   Not recorded     Body mass index is 23.13 kg/m.  PHYSICAL EXAMNIATION:  Gen: NAD, conversant, well nourised, well groomed                     Cardiovascular: Regular rate rhythm, no peripheral edema, warm, nontender. Eyes: Conjunctivae clear without  exudates or hemorrhage Neck: Supple, no carotid bruits. Pulmonary: Clear to auscultation bilaterally   NEUROLOGICAL EXAM:  MENTAL STATUS: Speech:    Speech is normal; fluent and spontaneous with normal comprehension.  Cognition:     Orientation to time, place and person     Normal recent and remote memory     Normal Attention span and concentration     Normal Language, naming, repeating,spontaneous speech     Fund of knowledge   CRANIAL NERVES: CN II: Visual fields are full to confrontation. Pupils are round equal and briskly reactive to light. CN III, IV, VI: No ptosis.  Left abduction limitation, CN V: Facial sensation is intact to light touch CN VII: Face is symmetric with normal eye closure  CN VIII: Hearing is normal to causal conversation. CN IX, X: Phonation is normal. CN XI: Head turning and shoulder shrug are intact  MOTOR: There is no pronator drift of out-stretched arms. Muscle bulk and tone are normal. Muscle strength is normal.  REFLEXES: Reflexes are 3 and symmetric at the biceps, triceps, knees, and bilateral ankles nonsustained clonus. Plantar responses are extensor bilaterally  SENSORY: Decreased vibratory sensation, light touch at toes  COORDINATION: There is no trunk or limb dysmetria noted.  GAIT/STANCE: She needs push-up to get up from seated position, wide-based, stiff, unsteady gait Romberg is absent.  REVIEW OF SYSTEMS:  Full 14 system review of systems performed and notable only for as above All other review of systems were negative.   ALLERGIES: Allergies  Allergen Reactions  Codeine Hives   Effexor [Venlafaxine]    Sulfa Antibiotics Hives   Sulfasalazine Hives and Itching   Wellbutrin [Bupropion]     dizziness    HOME MEDICATIONS: Current Outpatient Medications  Medication Sig Dispense Refill   atenolol (TENORMIN) 50 MG tablet Take 100 mg by mouth daily.      ergocalciferol (VITAMIN D2) 1.25 MG (50000 UT) capsule Take by mouth.      hydrOXYzine (ATARAX/VISTARIL) 25 MG tablet Take 1 tablet by mouth 3 (three) times daily as needed.     losartan (COZAAR) 100 MG tablet Take 100 mg by mouth daily.     magnesium oxide (MAG-OX) 400 (240 Mg) MG tablet Take 1 tablet by mouth daily.     oxyCODONE-acetaminophen (ROXICET) 5-325 MG tablet Take 1-2 tablets by mouth every 4 (four) hours as needed for severe pain. 60 tablet 0   PARoxetine (PAXIL) 20 MG tablet Take 20 mg by mouth daily.      rosuvastatin (CRESTOR) 20 MG tablet Take 20 mg by mouth daily.     No current facility-administered medications for this visit.    PAST MEDICAL HISTORY: Past Medical History:  Diagnosis Date   Anxiety state, unspecified    Clavicle fracture    right   Depression    Essential hypertension, benign    History of pneumonia     PAST SURGICAL HISTORY: Past Surgical History:  Procedure Laterality Date   BREAST SURGERY Left    lumpectomy   EYE SURGERY     left   ORIF CLAVICULAR FRACTURE Right 03/05/2016   Procedure: Right open reduction internal fixation clavical fracture ;  Surgeon: Tania Ade, MD;  Location: [place];  Service: Orthopedics;  Laterality: Right;    FAMILY HISTORY: Family History  Problem Relation Age of Onset   Breast cancer Mother    Diabetes Mother        pre-diabetes   Hypertension Mother    Prostate cancer Father    Hypertension Father    Ovarian cancer Maternal Grandmother    Stroke Maternal Grandfather    Breast cancer Paternal Grandmother    Colon cancer Paternal Grandfather    Stomach cancer Cousin     SOCIAL HISTORY: Social History   Socioeconomic History   Marital status: Single    Spouse name: Not on file   Number of children: 0   Years of education: Not on file   Highest education level: Not on file  Occupational History   Occupation: student  Tobacco Use   Smoking status: Every Day    Packs/day: 1.00    Years: 20.00    Pack years: 20.00    Types: Cigarettes    Smokeless tobacco: Never  Substance and Sexual Activity   Alcohol use: Yes    Alcohol/week: 7.0 standard drinks    Types: 7 Glasses of wine per week    Comment: social   Drug use: No   Sexual activity: Not Currently  Other Topics Concern   Not on file  Social History Narrative   Not on file   Social Determinants of Health   Financial Resource Strain: Not on file  Food Insecurity: Not on file  Transportation Needs: Not on file  Physical Activity: Not on file  Stress: Not on file  Social Connections: Not on file  Intimate Partner Violence: Not on file      Marcial Pacas, M.D. Ph.D.  [facility] [address] [place], [place] [address] Ph: [phone])  [phone] Fax: [phone]  CC:  Heywood Bene, PA-C [address] [place],  [place] [address]  Heywood Bene, PA-C

## 2021-07-29 NOTE — Procedures (Signed)
Patient Name: Tara [name]  MRN: [id number]  Epilepsy Attending: Lora Havens  Referring Physician/Provider: Eric Form, NP Date: 08/14/2021 Duration: 23.47 mins  Patient history: 55yo F with ams s/p V fib arrest. EEG to evaluate for seizure.  Level of alertness:  comatose  AEDs during EEG study: propofol  Technical aspects: This EEG study was done with scalp electrodes positioned according to the 10-20 International system of electrode placement. Electrical activity was acquired at a sampling rate of 500Hz  and reviewed with a high frequency filter of 70Hz  and a low frequency filter of 1Hz . EEG data were recorded continuously and digitally stored.   Description: EEG showed continuous generalized background suppression. EEG was not reactive to reactive stimuli. Hyperventilation and photic stimulation were not performed.     ABNORMALITY - Background suppression, generalized  IMPRESSION: This study is suggestive of profound diffuse encephalopathy, nonspecific etiology but likely related to sedation, anoxic/hypoxic brain injury. No seizures or epileptiform discharges were seen throughout the recording.  [name] Barbra Sarks

## 2021-07-29 NOTE — Progress Notes (Signed)
RT assisted with transportation of this pt from ED room 22 to CT, then from CT to 3M11 while on full ventilatory support. Pt tolerated well with SVS and no complications.

## 2021-07-29 NOTE — Progress Notes (Signed)
  Echocardiogram 2D Echocardiogram has been performed.  Merrie Roof F 08/13/2021, 6:20 PM

## 2021-07-29 NOTE — Progress Notes (Signed)
ANTICOAGULATION CONSULT NOTE - Initial Consult  Pharmacy Consult for IV Heparin Indication: chest pain/ACS  Allergies  Allergen Reactions   Codeine Hives   Effexor [Venlafaxine]    Sulfa Antibiotics Hives   Sulfasalazine Hives and Itching   Wellbutrin [Bupropion]     dizziness    Patient Measurements: Height: 5\' 5"  (165.1 cm) IBW/kg (Calculated) : 57 Heparin Dosing Weight: 63 kg  Vital Signs: Temp: 97.6 F (36.4 C) (11/01 1248) Temp Source: Axillary (11/01 1248) BP: 85/68 (11/01 1635) Pulse Rate: 107 (11/01 1558)  Labs: Recent Labs    08/09/2021 1226 08/17/2021 1410 08/19/2021 1426  HGB 10.8* 11.6*  --   HCT 34.3* 34.0*  --   PLT 73*  --   --   CREATININE 1.52*  --   --   TROPONINIHS 48*  --  335*    Estimated Creatinine Clearance: 39 mL/min (A) (by C-G formula based on SCr of 1.52 mg/dL (H)).   Medical History: Past Medical History:  Diagnosis Date   Anxiety state, unspecified    Clavicle fracture    right   Depression    Essential hypertension, benign    History of pneumonia     Medications:  (Not in a hospital admission)  Scheduled:   docusate  100 mg Per Tube BID   pantoprazole (PROTONIX) IV  40 mg Intravenous QHS   polyethylene glycol  17 g Per Tube Daily    Assessment: 55 yo F from EMS s/p VFIB and CPR for 20 minutes. Found to be COVID+ with an aspiration PNA. Pharmacy consulted to start heparin IV for ACS/STEMI.  plts 73, Hgb 11.6  Goal of Therapy:  Heparin level 0.3-0.7 units/ml Monitor platelets by anticoagulation protocol: Yes   Plan:  Start Heparin IV 750 units/hr (no bolus)  Check 6 hour heparin level  Daily CBC and heparin level  Naphtali Zywicki S Lopaka Karge 08/13/2021,4:39 PM

## 2021-07-29 NOTE — Progress Notes (Signed)
Contacted [facility] in regards to patients HR steadily dropping. Due to poor circulation unable to get O2 saturation reading. Also to double check thermoregulation protocol along with order. Will await word from doctor. Will continue monitoring at this time.

## 2021-07-29 NOTE — Progress Notes (Signed)
[facility] Progress Note Patient Name: Tara [name] DOB: 09/26/1966 MRN: [id number]   Date of Service  08/18/2021  HPI/Events of Note  - pt becoming more brady (sinus brady) and hypothermic - pt's latest pH low at 7.0  eICU Interventions  - change from LEVO to EPI - hold an vial of atropine at the bedside - ordered an amp of bicarb push     Intervention Category Major Interventions: Arrhythmia - evaluation and management  Tilden Dome 08/01/2021, 11:35 PM

## 2021-07-30 ENCOUNTER — Inpatient Hospital Stay (HOSPITAL_COMMUNITY): Payer: BC Managed Care – PPO

## 2021-07-30 DIAGNOSIS — I2510 Atherosclerotic heart disease of native coronary artery without angina pectoris: Secondary | ICD-10-CM | POA: Diagnosis not present

## 2021-07-30 DIAGNOSIS — U071 COVID-19: Secondary | ICD-10-CM

## 2021-07-30 DIAGNOSIS — J9601 Acute respiratory failure with hypoxia: Secondary | ICD-10-CM | POA: Diagnosis not present

## 2021-07-30 DIAGNOSIS — I4901 Ventricular fibrillation: Secondary | ICD-10-CM | POA: Diagnosis not present

## 2021-07-30 DIAGNOSIS — I5021 Acute systolic (congestive) heart failure: Secondary | ICD-10-CM

## 2021-07-30 DIAGNOSIS — N179 Acute kidney failure, unspecified: Secondary | ICD-10-CM

## 2021-07-30 DIAGNOSIS — I2584 Coronary atherosclerosis due to calcified coronary lesion: Secondary | ICD-10-CM

## 2021-07-30 DIAGNOSIS — I469 Cardiac arrest, cause unspecified: Secondary | ICD-10-CM | POA: Diagnosis not present

## 2021-07-30 LAB — BASIC METABOLIC PANEL
Anion gap: 18 — ABNORMAL HIGH (ref 5–15)
BUN: 17 mg/dL (ref 6–20)
CO2: 11 mmol/L — ABNORMAL LOW (ref 22–32)
Calcium: 7.4 mg/dL — ABNORMAL LOW (ref 8.9–10.3)
Chloride: 101 mmol/L (ref 98–111)
Creatinine, Ser: 2.13 mg/dL — ABNORMAL HIGH (ref 0.44–1.00)
GFR, Estimated: 27 mL/min — ABNORMAL LOW (ref >60)
Glucose, Bld: 252 mg/dL — ABNORMAL HIGH (ref 70–99)
Potassium: 4.5 mmol/L (ref 3.5–5.1)
Sodium: 130 mmol/L — ABNORMAL LOW (ref 135–145)

## 2021-07-30 LAB — LACTIC ACID, PLASMA: Lactic Acid, Venous: 6.4 mmol/L (ref 0.5–1.9)

## 2021-07-30 LAB — POCT I-STAT 7, (LYTES, BLD GAS, ICA,H+H)
Acid-base deficit: 17 mmol/L — ABNORMAL HIGH (ref 0.0–2.0)
Acid-base deficit: 12 mmol/L — ABNORMAL HIGH (ref 0.0–2.0)
Bicarbonate: 12 mmol/L — ABNORMAL LOW (ref 20.0–28.0)
Bicarbonate: 14.9 mmol/L — ABNORMAL LOW (ref 20.0–28.0)
Calcium, Ion: 1.05 mmol/L — ABNORMAL LOW (ref 1.15–1.40)
Calcium, Ion: 1.02 mmol/L — ABNORMAL LOW (ref 1.15–1.40)
HCT: 30 % — ABNORMAL LOW (ref 36.0–46.0)
HCT: 29 % — ABNORMAL LOW (ref 36.0–46.0)
Hemoglobin: 10.2 g/dL — ABNORMAL LOW (ref 12.0–15.0)
Hemoglobin: 9.9 g/dL — ABNORMAL LOW (ref 12.0–15.0)
O2 Saturation: 95 %
O2 Saturation: 98 %
Patient temperature: 35.9
Patient temperature: 99.7
Potassium: 4.4 mmol/L (ref 3.5–5.1)
Potassium: 4.4 mmol/L (ref 3.5–5.1)
Sodium: 128 mmol/L — ABNORMAL LOW (ref 135–145)
Sodium: 128 mmol/L — ABNORMAL LOW (ref 135–145)
TCO2: 13 mmol/L — ABNORMAL LOW (ref 22–32)
TCO2: 16 mmol/L — ABNORMAL LOW (ref 22–32)
pCO2 arterial: 37.4 mmHg (ref 32.0–48.0)
pCO2 arterial: 35.7 mmHg (ref 32.0–48.0)
pH, Arterial: 7.106 — CL (ref 7.350–7.450)
pH, Arterial: 7.232 — ABNORMAL LOW (ref 7.350–7.450)
pO2, Arterial: 98 mmHg (ref 83.0–108.0)
pO2, Arterial: 136 mmHg — ABNORMAL HIGH (ref 83.0–108.0)

## 2021-07-30 LAB — COOXEMETRY PANEL
Carboxyhemoglobin: 0.6 % (ref 0.5–1.5)
Methemoglobin: 1.4 % (ref 0.0–1.5)
O2 Saturation: 98.5 %
Total hemoglobin: 9.4 g/dL — ABNORMAL LOW (ref 12.0–16.0)

## 2021-07-30 LAB — GLUCOSE, CAPILLARY
Glucose-Capillary: 119 mg/dL — ABNORMAL HIGH (ref 70–99)
Glucose-Capillary: 176 mg/dL — ABNORMAL HIGH (ref 70–99)
Glucose-Capillary: 203 mg/dL — ABNORMAL HIGH (ref 70–99)
Glucose-Capillary: 216 mg/dL — ABNORMAL HIGH (ref 70–99)
Glucose-Capillary: 217 mg/dL — ABNORMAL HIGH (ref 70–99)
Glucose-Capillary: 62 mg/dL — ABNORMAL LOW (ref 70–99)
Glucose-Capillary: 72 mg/dL (ref 70–99)

## 2021-07-30 LAB — CBC
HCT: 31.3 % — ABNORMAL LOW (ref 36.0–46.0)
Hemoglobin: 9.9 g/dL — ABNORMAL LOW (ref 12.0–15.0)
MCH: 34 pg (ref 26.0–34.0)
MCHC: 31.6 g/dL (ref 30.0–36.0)
MCV: 107.6 fL — ABNORMAL HIGH (ref 80.0–100.0)
Platelets: 65 10*3/uL — ABNORMAL LOW (ref 150–400)
RBC: 2.91 MIL/uL — ABNORMAL LOW (ref 3.87–5.11)
RDW: 12.2 % (ref 11.5–15.5)
WBC: 5.4 10*3/uL (ref 4.0–10.5)
nRBC: 0.6 % — ABNORMAL HIGH (ref 0.0–0.2)

## 2021-07-30 LAB — MAGNESIUM: Magnesium: 2.3 mg/dL (ref 1.7–2.4)

## 2021-07-30 LAB — MRSA NEXT GEN BY PCR, NASAL: MRSA by PCR Next Gen: NOT DETECTED

## 2021-07-30 LAB — TROPONIN I (HIGH SENSITIVITY): Troponin I (High Sensitivity): 3119 ng/L (ref <18)

## 2021-07-30 LAB — HEPARIN LEVEL (UNFRACTIONATED)
Heparin Unfractionated: <0.1 IU/mL — ABNORMAL LOW (ref 0.30–0.70)
Heparin Unfractionated: 0.15 IU/mL — ABNORMAL LOW (ref 0.30–0.70)

## 2021-07-30 LAB — PHOSPHORUS
Phosphorus: 4.7 mg/dL — ABNORMAL HIGH (ref 2.5–4.6)
Phosphorus: 4.3 mg/dL (ref 2.5–4.6)

## 2021-07-30 LAB — TRIGLYCERIDES: Triglycerides: 234 mg/dL — ABNORMAL HIGH (ref <150)

## 2021-07-30 LAB — POTASSIUM: Potassium: 4.6 mmol/L (ref 3.5–5.1)

## 2021-07-30 IMAGING — DX DG CHEST 1V PORT
1 series · 1 of 1 positions shown · non-contrast
Comparison: Previous studies including the examination of
[DATE]

CLINICAL DATA: Difficulty breathing

EXAM:
PORTABLE CHEST 1 VIEW

[chest ap]
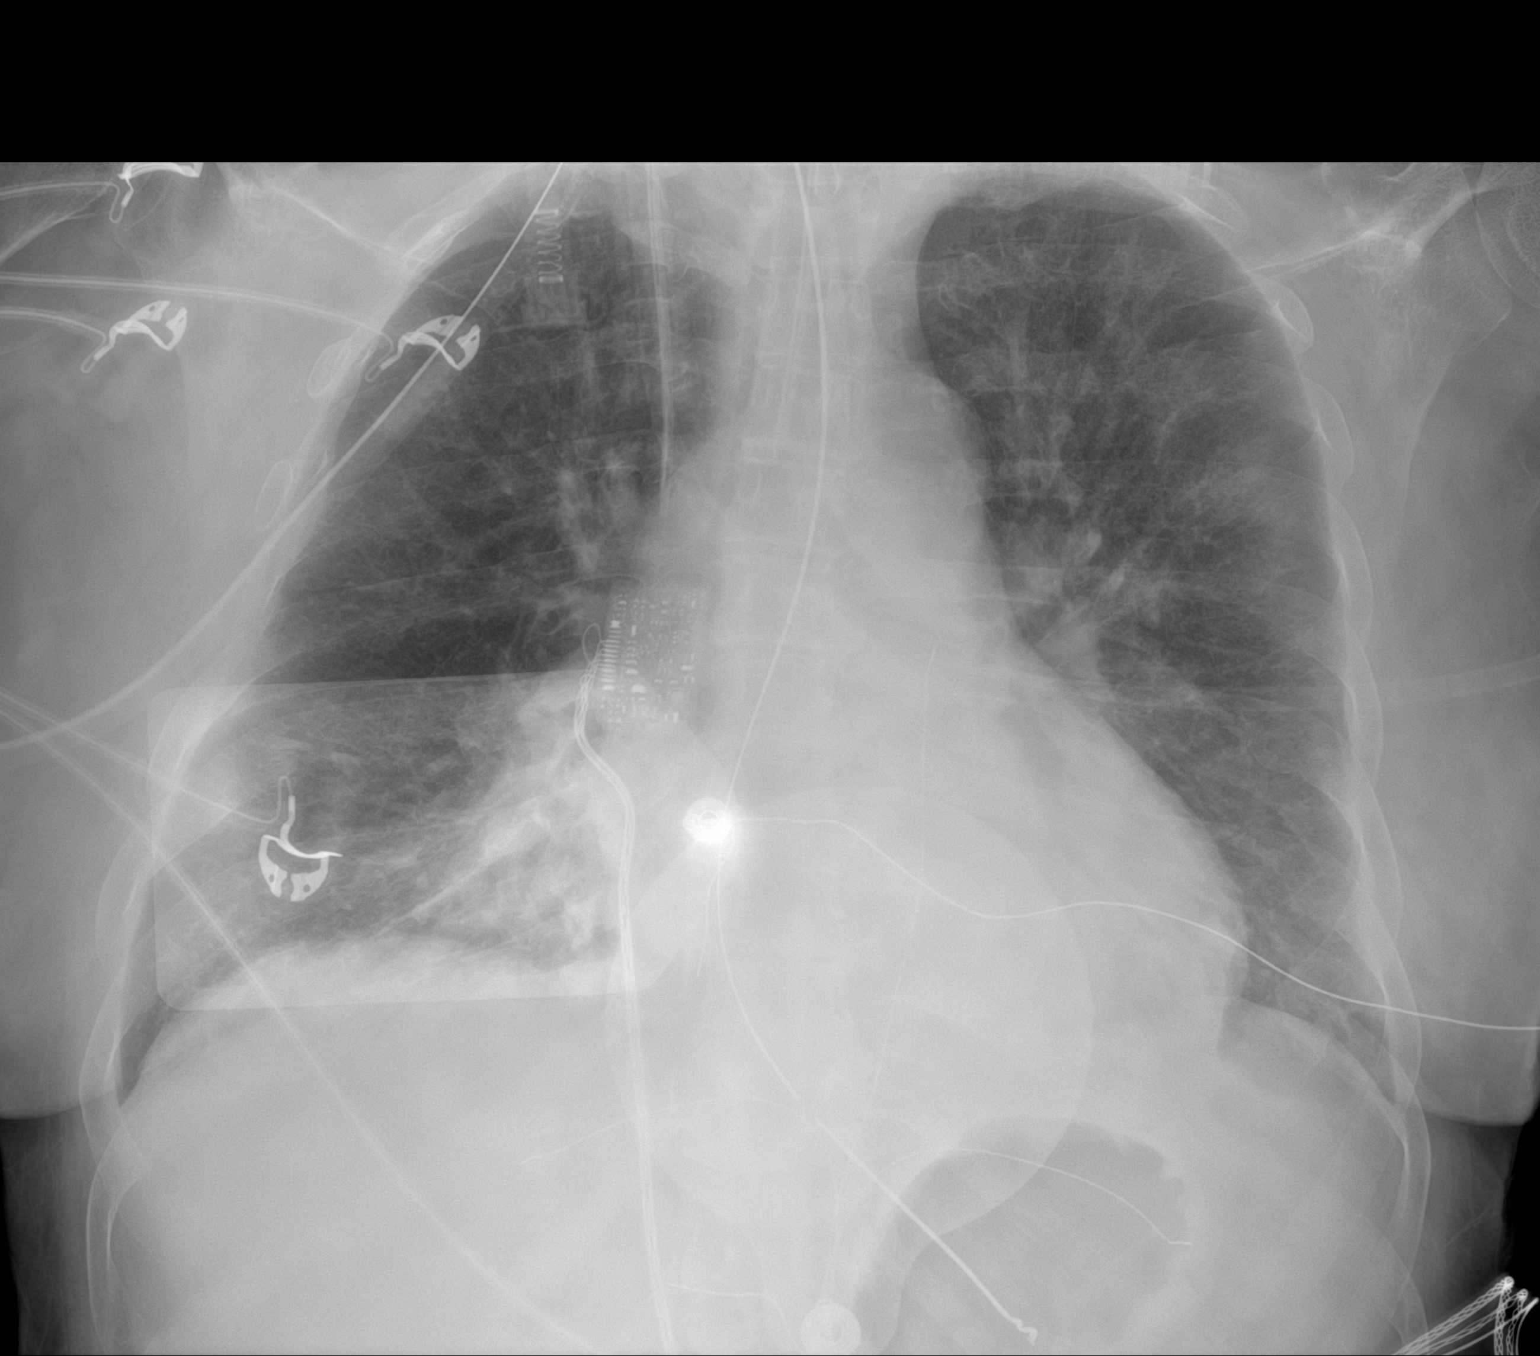

[1 of 1 positions shown; findings below may reference images not displayed]

FINDINGS: Transverse diameter of heart is increased. Central pulmonary vessels
are less prominent. There is decrease in interstitial and alveolar
densities in parahilar regions. Faint residual ground-glass
densities seen in the left upper lung fields, possibly residual
pulmonary edema or pneumonitis. Small patchy infiltrates in the
medial lower lung fields. Costophrenic angles are clear. There is no
pneumothorax. Tip of endotracheal tube is 2.3 cm above the carina.
Tip of enteric tube is seen in the fundus of the stomach with side
port at the gastroesophageal junction. Tip of central venous
catheter is seen in superior vena cava.
IMPRESSION: Cardiomegaly. There is interval decrease in pulmonary vascular
congestion. There is interval decrease in interstitial and alveolar
densities in the parahilar regions, more so on the left side
suggesting resolving pulmonary edema. Residual ground-glass
densities in the left upper lung fields may suggest residual
pulmonary edema or pneumonitis. Increased density is seen in medial
aspects of both lower lung fields suggesting possible
atelectasis/pneumonitis.

## 2021-07-30 MED ORDER — STERILE WATER FOR INJECTION IV SOLN
INTRAVENOUS | Status: DC
  Filled 2021-07-30 (×4): qty 1000

## 2021-07-30 MED ORDER — SODIUM BICARBONATE 8.4 % IV SOLN
INTRAVENOUS | Status: AC
  Filled 2021-07-30: qty 1000

## 2021-07-30 MED ORDER — SODIUM CHLORIDE 0.9% FLUSH
10.0000 mL | Freq: Two times a day (BID) | INTRAVENOUS | Status: DC
Start: 2021-07-30 — End: 2021-08-02
  Administered 2021-07-31 – 2021-08-01 (×4): 10 mL
  Administered 2021-08-02: 20 mL

## 2021-07-30 MED ORDER — VITAL HIGH PROTEIN PO LIQD: 1000.0000 mL | ORAL | Status: DC

## 2021-07-30 MED ORDER — VITAL 1.5 CAL PO LIQD
1000.0000 mL | ORAL | Status: DC
  Administered 2021-07-30: 1000 mL
  Filled 2021-07-30: qty 1000

## 2021-07-30 MED ORDER — SODIUM CHLORIDE 0.9% FLUSH
10.0000 mL | INTRAVENOUS | Status: DC | PRN
  Administered 2021-07-30: 10 mL

## 2021-07-30 MED ORDER — DOCUSATE SODIUM 50 MG/5ML PO LIQD: 100.0000 mg | Freq: Two times a day (BID) | ORAL | Status: DC | PRN

## 2021-07-30 MED ORDER — NOREPINEPHRINE 4 MG/250ML-% IV SOLN
INTRAVENOUS | Status: AC
  Administered 2021-07-30: 4 ug/min via INTRAVENOUS
  Filled 2021-07-30: qty 250

## 2021-07-30 MED ORDER — INSULIN ASPART 100 UNIT/ML IJ SOLN
0.0000 [IU] | INTRAMUSCULAR | Status: DC
  Administered 2021-07-30: 2 [IU] via SUBCUTANEOUS
  Administered 2021-08-02: 1 [IU] via SUBCUTANEOUS

## 2021-07-30 MED ORDER — NOREPINEPHRINE 4 MG/250ML-% IV SOLN
0.0000 ug/min | INTRAVENOUS | Status: DC
  Administered 2021-07-31: 3 ug/min via INTRAVENOUS
  Filled 2021-07-30: qty 250

## 2021-07-30 MED ORDER — CHLORHEXIDINE GLUCONATE CLOTH 2 % EX PADS
6.0000 | MEDICATED_PAD | Freq: Every day | CUTANEOUS | Status: DC
  Administered 2021-07-30 – 2021-08-01 (×3): 6 via TOPICAL

## 2021-07-30 MED ORDER — DEXTROSE 50 % IV SOLN
12.5000 g | INTRAVENOUS | Status: AC
  Filled 2021-07-30: qty 50

## 2021-07-30 MED ORDER — PROSOURCE TF PO LIQD
90.0000 mL | Freq: Three times a day (TID) | ORAL | Status: DC
  Administered 2021-07-30 – 2021-07-31 (×3): 90 mL
  Filled 2021-07-30 (×3): qty 90

## 2021-07-30 MED ORDER — SODIUM CHLORIDE 0.9 % IV SOLN
3.0000 g | Freq: Two times a day (BID) | INTRAVENOUS | Status: DC
  Administered 2021-07-30 – 2021-08-01 (×4): 3 g via INTRAVENOUS
  Filled 2021-07-30 (×4): qty 8

## 2021-07-30 NOTE — Progress Notes (Signed)
ANTICOAGULATION CONSULT NOTE - Follow Up Consult  Pharmacy Consult for IV Heparin Indication: chest pain/ACS  Allergies  Allergen Reactions   Codeine Hives   Effexor [Venlafaxine]    Sulfa Antibiotics Hives   Sulfasalazine Hives and Itching   Wellbutrin [Bupropion]     dizziness    Patient Measurements: Height: 5\' 5"  (165.1 cm) Weight: 66.6 kg (146 lb 13.2 oz) IBW/kg (Calculated) : 57 Heparin Dosing Weight: 63 kg  Vital Signs: Temp: 100 F (37.8 C) (11/02 1215) Temp Source: Bladder (11/02 0800) BP: 118/59 (11/02 1158) Pulse Rate: 75 (11/02 1215)  Labs: Recent Labs    08/19/2021 1226 07/30/2021 1410 08/19/2021 1426 08/11/2021 1800 08/21/2021 2135 07/30/21 0351 07/30/21 0355 07/30/21 0852 07/30/21 1040 07/30/21 1343  HGB 10.8*   < >  --   --    < > 9.9* 10.2* 9.9*  --   --   HCT 34.3*   < >  --   --    < > 31.3* 30.0* 29.0*  --   --   PLT 73*  --   --   --   --  65*  --   --   --   --   HEPARINUNFRC  --   --   --   --   --  <0.10*  --   --   --  0.15*  CREATININE 1.52*  --   --  1.71*  --  2.13*  --   --   --   --   TROPONINIHS 48*  --  335*  --   --   --   --   --  3,119*  --    < > = values in this interval not displayed.     Estimated Creatinine Clearance: 27.8 mL/min (A) (by C-G formula based on SCr of 2.13 mg/dL (H)).   Medical History: Past Medical History:  Diagnosis Date   Anxiety state, unspecified    Clavicle fracture    right   Depression    Essential hypertension, benign    History of pneumonia     Medications:  Medications Prior to Admission  Medication Sig Dispense Refill Last Dose   atenolol (TENORMIN) 100 MG tablet Take 100 mg by mouth daily.   08/20/2021 at 0830   ergocalciferol (VITAMIN D2) 1.25 MG (50000 UT) capsule Take 50,000 Units by mouth once a week.   Past Week   EYLEA 2 MG/0.05ML SOSY Place into the left eye every 3 (three) months.   05/06/2021   hydrOXYzine (ATARAX/VISTARIL) 25 MG tablet Take 1 tablet by mouth 3 (three) times daily as  needed for anxiety.   unknown   losartan (COZAAR) 100 MG tablet Take 100 mg by mouth daily.   08/13/2021   magnesium oxide (MAG-OX) 400 (240 Mg) MG tablet Take 1 tablet by mouth daily.   07/28/2021   PARoxetine (PAXIL) 20 MG tablet Take 20 mg by mouth in the morning and at bedtime.   08/14/2021   rosuvastatin (CRESTOR) 20 MG tablet Take 20 mg by mouth daily.   08/05/2021   traMADol (ULTRAM) 50 MG tablet Take 50 mg by mouth every 6 (six) hours as needed for moderate pain or severe pain.   unknown   oxyCODONE-acetaminophen (ROXICET) 5-325 MG tablet Take 1-2 tablets by mouth every 4 (four) hours as needed for severe pain. (Patient not taking: No sig reported) 60 tablet 0 Completed Course   Scheduled:   atropine  1 mg Intravenous Once  chlorhexidine gluconate (MEDLINE KIT)  15 mL Mouth Rinse BID   Chlorhexidine Gluconate Cloth  6 each Topical Daily   docusate  100 mg Per Tube BID   feeding supplement (PROSource TF)  90 mL Per Tube TID   insulin aspart  0-6 Units Subcutaneous Q4H   mouth rinse  15 mL Mouth Rinse 10 times per day   pantoprazole (PROTONIX) IV  40 mg Intravenous QHS   polyethylene glycol  17 g Per Tube Daily   sodium chloride flush  10-40 mL Intracatheter Q12H    Assessment: 55 yo F from EMS s/p VFIB and CPR for 20 minutes. Found to be COVID+ with an aspiration PNA. Pharmacy consulted to start heparin IV for ACS/STEMI.  Heparin level of 0.15 is subtherapeutic on heparin 1000 units/hr. Level drawn appropriately. Hgb 9.9. plt 65 - decrease from 73 on admission. Per RN no issues with IV infusion or access. Some minimal blood in mouth noted per RN thought to be due to patient biting her tongue. No other bleeding noted.  Goal of Therapy:  Heparin level 0.3 - 0.5 units/ml.  Monitor platelets by anticoagulation protocol: Yes   Plan:  Increase heparin to 1100 units/hr  No bolus with plts Watch plts closely  Check 8 hr heparin level  Monitor heparin level, CBC and s/s of bleeding  daily   Cristela Felt, PharmD, BCPS Clinical Pharmacist 07/30/2021 2:49 PM

## 2021-07-30 NOTE — Progress Notes (Signed)
Initial Nutrition Assessment  DOCUMENTATION CODES:   Not applicable  INTERVENTION:   Initiate Vital 1.5 @ 20 ml/hr via OGT  90 ml Prosource TF TID.    Tube feeding regimen provides 960 kcal (96% of needs), 98 grams of protein, and 367 ml of H2O.    NUTRITION DIAGNOSIS:   Inadequate oral intake related to inability to eat as evidenced by NPO status.  GOAL:   Patient will meet greater than or equal to 90% of their needs  MONITOR:   Vent status, Labs, Weight trends, Skin, I & O's, TF tolerance  REASON FOR ASSESSMENT:   Ventilator    ASSESSMENT:   Tara [name] is a 55 year old female who suffered a ventricular fibrillation cardiac arrest at home.  CPR was started by her husband.  There is a 30 to 60-day history of fatigue, weakness, and tingling in her feet.  She was seen in the neurology office today and after arriving home from the appointment, she was found by her husband not breathing.  CPR was started by her husband, the fire department arrived and delivered 2 shocks based upon algorithm, and slightly later EMS arrived, found her in VF, and did an additional shock with return to normal sinus rhythm.  Time to ROSC 20 minutes.  Pt admitted with ventricular fibrillation cardiac arrest.   Patient is currently intubated on ventilator support MV: 11.4 L/min Temp (24hrs), Avg:97.6 F (36.4 C), Min:95 F (35 C), Max:99.7 F (37.6 C)  Reviewed I/O's: +3 L x 24 hours  UOP: 50 ml x 24 hours  NGT output: 250 ml x 24 hours  MAP: 90   Case discussed with MD. Received permission to start TF today (trickle feeds).    Medications reviewed and include colace, adrenalin, propofol, pitressin, and sodium bicarbonate 150 mEq in dextrose 5% @ 75 ml/hr.   Labs reviewed: CBGS: 203-216 (inpatient orders for glycemic control are none).    Diet Order:   Diet Order             Diet NPO time specified  Diet effective now                   EDUCATION NEEDS:   Not appropriate  for education at this time  Skin:  Skin Assessment: Reviewed RN Assessment  Last BM:  Unknown  Height:   Ht Readings from Last 1 Encounters:  08/11/2021 5\' 5"  (1.651 m)    Weight:   Wt Readings from Last 1 Encounters:  07/30/21 66.6 kg    Ideal Body Weight:  56.8 kg  BMI:  Body mass index is 24.43 kg/m.  Estimated Nutritional Needs:   Kcal:  [phone]  Protein:  100-130 grams  Fluid:  > 1 L    Loistine Chance, RD, LDN, [facility] Registered Dietitian II Certified Diabetes Care and Education Specialist Please refer to [facility] for RD and/or RD on-call/weekend/after hours pager

## 2021-07-30 NOTE — Progress Notes (Signed)
[facility] Progress Note Patient Name: Tara [name] DOB: 1965/11/29 MRN: [id number]   Date of Service  07/30/2021  HPI/Events of Note  Pt is s/p VF arrest, becoming hypotensive on vasopressin.  MAP in the 50s as per bedside RN.   eICU Interventions  Start on levophed.      Intervention Category Major Interventions: Shock - evaluation and management  Elsie Lincoln 07/30/2021, 9:58 PM

## 2021-07-30 NOTE — Progress Notes (Signed)
ANTICOAGULATION CONSULT NOTE - Follow Up Consult  Pharmacy Consult for heparin Indication:  r/o ACS s/p VF arrest  Labs: Recent Labs    08/05/2021 1226 08/02/2021 1410 08/22/2021 1426 08/08/2021 1800 08/23/2021 2135 07/30/21 0351 07/30/21 0355  HGB 10.8*   < >  --   --  10.9* 9.9* 10.2*  HCT 34.3*   < >  --   --  32.0* 31.3* 30.0*  PLT 73*  --   --   --   --  65*  --   HEPARINUNFRC  --   --   --   --   --  <0.10*  --   CREATININE 1.52*  --   --  1.71*  --   --   --   TROPONINIHS 48*  --  335*  --   --   --   --    < > = values in this interval not displayed.    Assessment: 55yo female subtherapeutic on heparin with initial dosing for possible ACS; no infusion issues or signs of bleeding per RN.  Goal of Therapy:  Heparin level 0.3-0.7 units/ml   Plan:  Will increase heparin infusion by 4 units/kg/hr to 1000 units/hr and check level in 8 hours.    Wynona Neat, PharmD, BCPS  07/30/2021,4:57 AM

## 2021-07-30 NOTE — Progress Notes (Signed)
Progress Note  Patient Name: Tara [name] Date of Encounter: 07/30/2021  [place] Cardiologist: None new  Subjective   Intubated and not responsive.  Inpatient Medications    Scheduled Meds:  atropine  1 mg Intravenous Once   chlorhexidine gluconate (MEDLINE KIT)  15 mL Mouth Rinse BID   Chlorhexidine Gluconate Cloth  6 each Topical Daily   docusate  100 mg Per Tube BID   insulin aspart  0-6 Units Subcutaneous Q4H   mouth rinse  15 mL Mouth Rinse 10 times per day   pantoprazole (PROTONIX) IV  40 mg Intravenous QHS   polyethylene glycol  17 g Per Tube Daily   sodium chloride flush  10-40 mL Intracatheter Q12H   Continuous Infusions:  sodium chloride     amiodarone 30 mg/hr (07/30/21 0900)   ampicillin-sulbactam (UNASYN) IV     epinephrine 7 mcg/min (07/30/21 0900)   fentaNYL infusion INTRAVENOUS 75 mcg/hr (07/30/21 0900)   heparin 1,000 Units/hr (07/30/21 0900)   propofol (DIPRIVAN) infusion 10 mcg/kg/min (07/30/21 0944)   remdesivir 100 mg in NS 100 mL 100 mg (07/30/21 0916)    sodium bicarbonate (isotonic) infusion in sterile water     vasopressin 0.04 Units/min (07/30/21 0900)   PRN Meds: acetaminophen, docusate, fentaNYL (SUBLIMAZE) injection, fentaNYL (SUBLIMAZE) injection, levalbuterol, ondansetron (ZOFRAN) IV, polyethylene glycol, sodium chloride flush   Vital Signs    Vitals:   07/30/21 1000 07/30/21 1015 07/30/21 1030 07/30/21 1115  BP:      Pulse: 62 65 68   Resp: (!) 26 (!) 26 (!) 26   Temp: 99.7 F (37.6 C) 99.7 F (37.6 C) 99.7 F (37.6 C) 98.6 F (37 C)  TempSrc:      SpO2: 100% 100% 100%   Weight:      Height:        Intake/Output Summary (Last 24 hours) at 07/30/2021 1139 Last data filed at 07/30/2021 0900 Gross per 24 hour  Intake 3618.64 ml  Output 300 ml  Net 3318.64 ml   Last 3 Weights 07/30/2021 08/10/2021 08/21/2021  Weight (lbs) 146 lb 13.2 oz 146 lb 13.2 oz 139 lb  Weight (kg) 66.6 kg 66.6 kg 63.05 kg      Telemetry     No ventricular arrhythmias noted.- Personally Reviewed  ECG    Most recent EKG at 12:54 PM on 08/01/2021.- Personally Reviewed  Physical Exam  Acrocyanosis noted  Labs    High Sensitivity Troponin:   Recent Labs  Lab 08/11/2021 1226 08/25/2021 1426 07/30/21 1040  TROPONINIHS 48* 335* 3,119*     Chemistry Recent Labs  Lab 08/20/2021 1226 08/03/2021 1410 08/14/2021 1530 08/16/2021 1800 08/12/2021 2135 07/30/21 0351 07/30/21 0355 07/30/21 0852  NA 130*   < >  --  131*   < > 130* 128* 128*  K 4.1   < >  --  5.5*   < > 4.5 4.4 4.4  CL 102  --   --  102  --  101  --   --   CO2 14*  --   --  14*  --  11*  --   --   GLUCOSE 153*  --   --  88  --  252*  --   --   BUN 15  --   --  17  --  17  --   --   CREATININE 1.52*  --   --  1.71*  --  2.13*  --   --  CALCIUM 8.0*  --   --  8.1*  --  7.4*  --   --   MG  --   --  1.9  --   --  2.3  --   --   PROT 5.1*  --   --   --   --   --   --   --   ALBUMIN 2.8*  --   --   --   --   --   --   --   AST 298*  --   --   --   --   --   --   --   ALT 132*  --   --   --   --   --   --   --   ALKPHOS 148*  --   --   --   --   --   --   --   BILITOT 0.4  --   --   --   --   --   --   --   GFRNONAA 41*  --   --  36*  --  27*  --   --   ANIONGAP 14  --   --  15  --  18*  --   --    < > = values in this interval not displayed.    Lipids  Recent Labs  Lab 07/30/21 0351  TRIG 234*    Hematology Recent Labs  Lab 08/15/2021 1226 08/16/2021 1410 07/30/21 0351 07/30/21 0355 07/30/21 0852  WBC 9.2  --  5.4  --   --   RBC 3.19*  --  2.91*  --   --   HGB 10.8*   < > 9.9* 10.2* 9.9*  HCT 34.3*   < > 31.3* 30.0* 29.0*  MCV 107.5*  --  107.6*  --   --   MCH 33.9  --  34.0  --   --   MCHC 31.5  --  31.6  --   --   RDW 11.9  --  12.2  --   --   PLT 73*  --  65*  --   --    < > = values in this interval not displayed.   Thyroid  Recent Labs  Lab 08/18/2021 1630  TSH 1.435    BNP Recent Labs  Lab 08/22/2021 1836  BNP 300.2*    DDimer  Recent Labs   Lab 08/14/2021 1530  DDIMER >20.00*     Radiology    DG Abd 1 View  Result Date: 07/30/2021 CLINICAL DATA:  OG tube placement EXAM: ABDOMEN - 1 VIEW COMPARISON:  08/03/2021 FINDINGS: Enteric tube terminates in the gastric cardia with its side port in the distal esophagus. IMPRESSION: Enteric tube terminates in the gastric cardia with its side port in the distal esophagus. Electronically Signed   By: Julian Hy M.D.   On: 07/30/2021 00:37   DG Abd 1 View  Result Date: 07/30/2021 CLINICAL DATA:  OG tube placement EXAM: ABDOMEN - 1 VIEW COMPARISON:  None. FINDINGS: OG tube loops on itself in the distal esophagus with the tip in the distal esophagus. Right base atelectasis or infiltrate. Nonobstructive bowel gas pattern. IMPRESSION: NG tube loops on itself with the tip in the distal esophagus. Electronically Signed   By: Rolm Baptise M.D.   On: 08/22/2021 23:26   CT Head Wo Contrast  Result Date: 07/30/2021 CLINICAL DATA:  Altered mental status,  status post cardiac arrest EXAM: CT HEAD WITHOUT CONTRAST TECHNIQUE: Contiguous axial images were obtained from the base of the skull through the vertex without intravenous contrast. COMPARISON:  02/28/2016 FINDINGS: Brain: A 0.7 by 0.5 cm hypodensity favoring chronic lacunar infarct is present in the right periventricular white matter on image 18 series 3. This has a chronic appearance, but was not present on the prior CT scan from 02/28/2016. There is well maintained gray-white differentiation with continued normal conspicuity of the basal ganglia and thalami. Mild progressive sulcal prominence particularly along the vertex, mildly advanced for age. No intracranial hemorrhage, mass lesion, or acute CVA. Vascular: There is atherosclerotic calcification of the cavernous carotid arteries bilaterally. Skull: Unremarkable Sinuses/Orbits: Chronic mucosal thickening in the maxillary sinuses, ethmoid air cells, and sphenoid sinuses noted compatible with chronic  sinusitis. There are air fluid levels in the sphenoid sinuses which could reflect superimposed acute sinusitis but may also be alternatively related to the patient's intubation. Other: No supplemental non-categorized findings. IMPRESSION: 1. No compelling findings of acute stroke/global anoxic injury. No intracranial hemorrhage or acute intracranial findings. 2. A small chronic lacunar infarct in the right periventricular white matter is noted. Although this has chronic characteristics, it was not present on the 02/28/2016 CT scan. 3. Mildly age advanced cerebral atrophy manifesting as sulcal prominence along the vertex. 4. Chronic paranasal sinusitis. Air-fluid levels in the sphenoid sinuses could be from intubation or acute sinusitis. Electronically Signed   By: Walter  Liebkemann M.D.   On: 08/08/2021 13:42   CT Angio Chest PE W and/or Wo Contrast  Result Date: 08/24/2021 CLINICAL DATA:  Cardiac Arrest; PE suspected, high prob; Patient present in CT on ventilator EXAM: CT ANGIOGRAPHY CHEST WITH CONTRAST TECHNIQUE: Multidetector CT imaging of the chest was performed using the standard protocol during bolus administration of intravenous contrast. Multiplanar CT image reconstructions and MIPs were obtained to evaluate the vascular anatomy. CONTRAST:  60mL OMNIPAQUE IOHEXOL 350 MG/ML SOLN COMPARISON:  Current chest radiograph.  Prior chest CT, 06/14/2014. FINDINGS: Cardiovascular: Pulmonary arteries are well opacified. Study is somewhat degraded by motion. Allowing for the respiratory motion degradation, there is no evidence of a pulmonary embolism. Heart is normal in size and configuration. No pericardial effusion. Great vessels normal in caliber. Aorta is not opacified. Mild descending thoracic aorta atherosclerotic calcifications. Mediastinum/Nodes: Endotracheal tube tip lies 2.8 cm above the carina. Visualized thyroid is unremarkable. No mediastinal or hilar masses or enlarged lymph nodes. Trachea and  esophagus are unremarkable. Lungs/Pleura: Near complete consolidation/atelectasis of the lower lobes, with more volume loss noted on the right. Patchy airspace opacities in the upper lobes, greater on the left, ground-glass opacities evident in the right middle lobe. Bilateral interstitial thickening, which is most evident in the upper lobes. No pneumothorax. Upper Abdomen: No acute abnormality. Musculoskeletal: Bilateral anterior rib fractures. On the right, these involve the second, third, fourth, fifth and sixth ribs, and likely the 7. There also more subtle fractures noted on the left, which are mole likely chronic or subacute. Possible subtle sternal fracture, although sternal assessment is limited by significant respiratory motion. No vertebral fractures. Review of the MIP images confirms the above findings. IMPRESSION: 1. No convincing pulmonary embolism. Study degraded by respiratory motion. 2. Lower lobes nearly opacified, likely a significant component, if not completely, atelectasis. 3. Additional patchy areas airspace opacities in both upper lobes and the right middle lobe, most evident in the left upper lobe, consistent with multifocal pneumonia, possibly due to areas of edema or lung contusion   from CPR. 4. Interstitial thickening.  Suspect interstitial edema. 5. Anterior rib fractures, which are most evident on the right with a appear recent/acute. Left-sided rib fractures may be chronic or subacute. Right-sided fractures may be from CPR. 6. Well-positioned endotracheal tube. Aortic Atherosclerosis (ICD10-I70.0). Electronically Signed   By: Lajean Manes M.D.   On: 08/26/2021 17:26   DG Chest Port 1 View  Result Date: 07/30/2021 CLINICAL DATA:  Difficulty breathing EXAM: PORTABLE CHEST 1 VIEW COMPARISON:  Previous studies including the examination of 08/25/2021 FINDINGS: Transverse diameter of heart is increased. Central pulmonary vessels are less prominent. There is decrease in interstitial and  alveolar densities in parahilar regions. Faint residual ground-glass densities seen in the left upper lung fields, possibly residual pulmonary edema or pneumonitis. Small patchy infiltrates in the medial lower lung fields. Costophrenic angles are clear. There is no pneumothorax. Tip of endotracheal tube is 2.3 cm above the carina. Tip of enteric tube is seen in the fundus of the stomach with side port at the gastroesophageal junction. Tip of central venous catheter is seen in superior vena cava. IMPRESSION: Cardiomegaly. There is interval decrease in pulmonary vascular congestion. There is interval decrease in interstitial and alveolar densities in the parahilar regions, more so on the left side suggesting resolving pulmonary edema. Residual ground-glass densities in the left upper lung fields may suggest residual pulmonary edema or pneumonitis. Increased density is seen in medial aspects of both lower lung fields suggesting possible atelectasis/pneumonitis. Electronically Signed   By: Elmer Picker M.D.   On: 07/30/2021 08:08   DG CHEST PORT 1 VIEW  Result Date: 08/06/2021 CLINICAL DATA:  Central line placement. EXAM: PORTABLE CHEST 1 VIEW COMPARISON:  Radiograph and CTA earlier today. FINDINGS: There is a new right internal jugular central venous catheter with tip overlying the SVC. No pneumothorax. The endotracheal tube is been retracted, tip now 5.9 cm from the carina. Decreasing opacity in the right upper lobe from prior exam, however worsening of opacity throughout the left hemithorax. Stable heart size and mediastinal contours. Bibasilar opacity better appreciated on prior CT, without interval radiographic change. Slight improvement in pulmonary edema. IMPRESSION: 1. New right internal jugular central venous catheter with tip overlying the SVC. No pneumothorax. 2. Endotracheal tube has been retracted, tip now 5.9 cm from the carina. 3. Improving right upper lobe aeration, however worsening opacity  throughout the left hemithorax. Bibasilar opacification was better appreciated on CT. 4. Improving pulmonary edema. Electronically Signed   By: Keith Rake M.D.   On: 08/14/2021 19:10   DG Chest Port 1 View  Result Date: 08/14/2021 CLINICAL DATA:  Post CPR EXAM: PORTABLE CHEST 1 VIEW COMPARISON:  September 2015 FINDINGS: Low lung volumes. Pulmonary vascular congestion with edema. No pleural effusion. No pneumothorax. Cardiomediastinal contours are within normal limits. Endotracheal tube is just above the carina and retraction is recommended. IMPRESSION: Endotracheal tube just above carina.  Retraction recommended. Pulmonary vascular congestion with edema. Electronically Signed   By: Macy Mis M.D.   On: 08/13/2021 12:43   EEG adult  Result Date: 08/22/2021 Lora Havens, MD     07/30/2021  3:05 AM Patient Name: Hadassa French [name] MRN: [id number] Epilepsy Attending: Lora Havens Referring Physician/Provider: Eric Form, NP Date:  Duration: 23.47 mins Patient history: 55yo F with ams s/p V fib arrest. EEG to evaluate for seizure. Level of alertness:  comatose AEDs during EEG study: propofol Technical aspects: This EEG study was done with scalp electrodes positioned according to the 10-20 International  system of electrode placement. Electrical activity was acquired at a sampling rate of 500Hz and reviewed with a high frequency filter of 70Hz and a low frequency filter of 1Hz. EEG data were recorded continuously and digitally stored. Description: EEG showed continuous generalized background suppression. EEG was not reactive to reactive stimuli. Hyperventilation and photic stimulation were not performed.   ABNORMALITY - Background suppression, generalized IMPRESSION: This study is suggestive of profound diffuse encephalopathy, nonspecific etiology but likely related to sedation, anoxic/hypoxic brain injury. No seizures or epileptiform discharges were seen throughout the recording. Priyanka O  Yadav   ECHOCARDIOGRAM COMPLETE  Result Date: 08/16/2021    ECHOCARDIOGRAM REPORT   Patient Name:   Gwendlyn Bunkley Date of Exam: 08/13/2021 Medical Rec #:  [id number]    Height:       65.0 in Accession #:    [id number]   Weight:       139.0 lb Date of Birth:  09/10/1966   BSA:          1.695 m Patient Age:    54 years     BP:           113/95 mmHg Patient Gender: F            HR:           86 bpm. Exam Location:  Inpatient Procedure: 2D Echo, Cardiac Doppler and Color Doppler STAT ECHO Indications:    Cardiac arrest  History:        Patient has no prior history of Echocardiogram examinations.                 Risk Factors:Current Smoker. Cardiac arrest out for 20 minutes.                 ETOH abuse. Covid 19 positive.  Sonographer:    Rachel Lane RDCS Referring Phys: [id number] GRACE E BOWSER IMPRESSIONS  1. Left ventricular ejection fraction, by estimation, is 25 to 30%. The left ventricle has severely decreased function. The left ventricle demonstrates global hypokinesis. Left ventricular diastolic function could not be evaluated.  2. Right ventricular systolic function is low normal. The right ventricular size is normal.  3. The mitral valve is normal in structure. Trivial mitral valve regurgitation.  4. Aortic valve regurgitation is mild. Mild aortic valve sclerosis is present, with no evidence of aortic valve stenosis. FINDINGS  Left Ventricle: Left ventricular ejection fraction, by estimation, is 25 to 30%. The left ventricle has severely decreased function. The left ventricle demonstrates global hypokinesis. The left ventricular internal cavity size was normal in size. There is no left ventricular hypertrophy. Left ventricular diastolic function could not be evaluated. Right Ventricle: The right ventricular size is normal. Right vetricular wall thickness was not assessed. Right ventricular systolic function is low normal. Left Atrium: Left atrial size was normal in size. Right Atrium: Right atrial size was  normal in size. Pericardium: There is no evidence of pericardial effusion. Mitral Valve: The mitral valve is normal in structure. There is mild thickening of the mitral valve leaflet(s). Trivial mitral valve regurgitation. Tricuspid Valve: The tricuspid valve is normal in structure. Tricuspid valve regurgitation is trivial. Aortic Valve: Aortic valve regurgitation is mild. Mild aortic valve sclerosis is present, with no evidence of aortic valve stenosis. Pulmonic Valve: The pulmonic valve was not assessed. Aorta: The aortic root is normal in size and structure. IAS/Shunts: No atrial level shunt detected by color flow Doppler.  LEFT VENTRICLE PLAX 2D LVIDd:           3.70 cm LVIDs:         3.10 cm LV PW:         1.00 cm LV IVS:        1.00 cm LVOT diam:     1.90 cm LVOT Area:     2.84 cm  LV Volumes (MOD) LV vol d, MOD A4C: 83.6 ml LV vol s, MOD A4C: 54.0 ml LV SV MOD A4C:     83.6 ml LEFT ATRIUM             Index        RIGHT ATRIUM           Index LA diam:        3.50 cm 2.07 cm/m   RA Area:     10.30 cm LA Vol (A2C):   57.2 ml 33.75 ml/m  RA Volume:   21.10 ml  12.45 ml/m LA Vol (A4C):   42.7 ml 25.20 ml/m LA Biplane Vol: 52.0 ml 30.68 ml/m   AORTA Ao Root diam: 2.80 cm  SHUNTS Systemic Diam: 1.90 cm Paula Ross MD Electronically signed by Paula Ross MD Signature Date/Time: 08/16/2021/6:31:43 PM    Final     Cardiac Studies   No new data  Patient Profile     54 y.o. female with recent h/o fatigue and weakness who is being seen 07/31/2021 for the evaluation of ventricular fibrillation cardiac arrest with ROSC after ~20 minutes. Lactic acid > 7.0, and ECG without acute ischemic changes. Noted Covid 19 positive in ER..  Assessment & Plan    Ventricular fibrillation cardiac arrest: No recurrence of malignant arrhythmia.  Okay to DC amiodarone.  If recurrent ventricular ectopy, may need long-term. Respiratory failure: Still being ventilated Anoxic brain injury: Being seen by neuro Acute kidney injury  stage III: Creatinine 2.13. Elevated troponin I: Not a candidate for coronary angiography at this time.  Recheck troponin I.  Has underlying coronary artery disease based on the presence of coronary artery calcification Acute systolic heart failure with global dysfunction: EF estimated to be 30%.  Likely secondary to global ischemia from cardiac arrest.  Will recheck troponin I.  Not a candidate for angiography at this time.      For questions or updates, please contact [facility] Please consult [email or web address] for contact info under        Signed,  W  III, MD  07/30/2021, 11:39 AM    

## 2021-07-30 NOTE — Progress Notes (Signed)
PHARMACY NOTE:  ANTIMICROBIAL RENAL DOSAGE ADJUSTMENT  Current antimicrobial regimen includes a mismatch between antimicrobial dosage and estimated renal function.  As per policy approved by the Pharmacy & Therapeutics and Medical Executive Committees, the antimicrobial dosage will be adjusted accordingly.  Current antimicrobial dosage:  Unasyn 3g IV q6h  Indication: Asp PNA  Renal Function:  Estimated Creatinine Clearance: 27.8 mL/min (A) (by C-G formula based on SCr of 2.13 mg/dL (H)).  Antimicrobial dosage has been changed to:  Unasyn 3g q12h  Thank you for allowing pharmacy to be a part of this patient's care.  Cristela Felt, PharmD, BCPS Clinical Pharmacist 07/30/2021 10:05 AM

## 2021-07-30 NOTE — Progress Notes (Signed)
NAME:  Tara [name], MRN:  [id number], DOB:  1966-07-11, LOS: 1 ADMISSION DATE:  08/08/2021, CONSULTATION DATE:  08/14/2021 REFERRING MD:  Maryan Rued, CHIEF COMPLAINT:  Post Cardiac Arrest   History of Present Illness:  Patient is a 55 year old female with a history of hypertension, recent issues with lower extremity weakness, shaking and recent weight loss presenting today as a cardiac arrest.  Patient's husband and EMS give the history.  Husband reports that over the last month or 2 patient has had worsening lower extremity weakness, shakiness, tingling in her legs and saw neurology for the first time today.  They had ordered an MRI and they had come home and she went inside and sat down.  Her husband went out to talk with the plumber and 5 minutes later came back in and noticed she was unresponsive sitting in the chair.  He did not notice that she was breathing and called 911.  He did start bystander CPR.  The call came in at 1115.  Fire arrived at 1118 and on their arrival they initiated CPR.  AED did suggest shock as patient was in V. fib.  She was shocked x2 when EMS arrived she was still in V. fib received 1 mg of epi and a third shock which put her back into sinus rhythm.  She was intubated with a 7.0 tube and did have spontaneous breaths.  Husband denies patient recently having any illnesses or complaining of chest pain or shortness of breath.  For the last month she had complained of fatigue.  She had still been eating and drinking.  She does not drink alcohol regularly but husband reports she does smoke heavily. CPR x 20 minutes  Upon arrival to the ED patient was Covid +, which was an incidental finding Na 130/ K 4.1 initially, then 5.2, Creatinine 1.52, Alk Phos 148, AST 290/ ALT 132/ Total Protein 5.1/ Troponin 48/ Lactate 7.2  She was intubated in the ED at 12:34 pm, ABG at 14:10 was 7.19/ 35/82/15/13/13.6,  This was on a rate of 16. She was overbreathing the vent. Saturation was 93%  WBC  is 9.2/ HGB is 10.8/ platelets are 73K  CT head was negative for acute stroke / global anoxic damage. 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   Pertinent  Medical History   Past Medical History:  Diagnosis Date   Anxiety state, unspecified    Clavicle fracture    right   Depression    Essential hypertension, benign    History of pneumonia    Heavy smoker  Significant Hospital Events: Including procedures, antibiotic start and stop dates in addition to other pertinent events   08/06/2021 Admission post v fib arrest  Frail chest added PEEP   Interim History / Subjective:   Critically ill, required high-dose pressors overnight, very acidotic Changed from Levophed to epinephrine and bicarbonate drip Anuric  Objective   Blood pressure (!) 117/57, pulse 65, temperature 99.7 F (37.6 C), resp. rate (!) 26, height _0  (1.651 m), weight 66.6 kg, last menstrual period 08/26/2011, SpO2 100 %.    Vent Mode: PRVC FiO2 (%):  [40 %-100 %] 40 % Set Rate:  [18 bmp-26 bmp] 26 bmp Vt Set:  [450 mL] 450 mL PEEP:  [5 cmH20-10 cmH20] 10 cmH20 Plateau Pressure:  [17 cmH20-23 cmH20] 19 cmH20  Intake/Output Summary (Last 24 hours) at 07/30/2021 1010 Last data filed at 07/30/2021 0900 Gross per 24 hour  Intake 3618.64 ml  Output 300 ml  Net 3318.64 ml   Filed Weights   08/18/2021 1938 07/30/21 0500  Weight: 66.6 kg 66.6 kg    Examination:  Middle-aged woman, intubated, sedated on propofol and fentanyl, no distress Mild pallor, no icterus Oral ETT, minimal secretions Bilateral air entry present, no rhonchi, no accessory muscle use, flail chest S1-S2 regular, mild bradycardia Soft, nontender abdomen, no organomegaly GCS 3, pupils 3 mm nonreactive Purple feet and toes   ABG shows improved metabolic acidosis. Chest  x-ray independently reviewed shows lines and tubes in position, mild interstitial infiltrates.  Labs show increase in creatinine, mild hyponatremia, normal potassium, no leukocytosis, rising lactate  Resolved Hospital Problem list     Assessment & Plan:  Post V Fib Arrest,Shocked x 3 before ROSC'20 minutes CPR , etiology not clear possible primary arrhythmia Cardiogenic shock EF down to 30%, global hypokinesis, good co-ox Plan Cardiology  following Continue Amiodarone IV Expect epinephrine to taper to off Can discontinue vasopressin acute respiratory failure IV heparin for 48 hours   Acute respiratory failure in setting of Cardiac Arrest Flail chest Heavy Smoker Bibasal pneumonia possible aspiration   Plan Continue PEEP of 10 Hold off spontaneous breathing trials Unasyn  Xopenex nebs prn  Covid + ( Incidental Finding on admission )  Remdesivir x 3 days    Acute anoxic encephalopathy CT Head with no acute stroke or global anoxia EEG negative Prior to admission concern for cervical myelopathy Plan Normothermia protocol -temperature remains low has not required cooling Frequent neuro checks Minimize sedation as able RASS score 0 MRI brain and C-spine once hemodynamically improved  AKI , likely ATN postarrest Hyponatremia Hypocalcemia  Plan Monitor UO Strict I&O Trend BMET   Thrombocytopenia , likely related to EtOH use Plan Trend CBC Monitor for bleeding / bruising Transfuse if symptomatic  Ischemic appearing toes -able to Doppler pulses, chronic per husband ?  PAD related to smoking   Best Practice (right click and "Reselect all SmartList Selections" daily)   Diet/type: tubefeeds DVT prophylaxis: systemic heparin GI prophylaxis: PPI Lines: N/A Foley:  Yes, and it is still needed Code Status:  full code Last date of multidisciplinary goals of care discussion [11/2] -updated mom Vaughan Basta and husband at bedside, they agree to no more CPR but continue  current life support measures  Labs   CBC: Recent Labs  Lab 08/17/2021 1226 07/31/2021 1410 08/20/2021 2135 07/30/21 0351 07/30/21 0355 07/30/21 0852  WBC 9.2  --   --  5.4  --   --   NEUTROABS 5.2  --   --   --   --   --   HGB 10.8* 11.6* 10.9* 9.9* 10.2* 9.9*  HCT 34.3* 34.0* 32.0* 31.3* 30.0* 29.0*  MCV 107.5*  --   --  107.6*  --   --   PLT 73*  --   --  65*  --   --      Basic Metabolic Panel: Recent Labs  Lab 08/07/2021 1226 08/14/2021 1410 07/30/2021 1530 08/09/2021 1800 08/18/2021 2135 07/30/21 0019 07/30/21 0351 07/30/21 0355 07/30/21 0852  NA 130*   < >  --  131* 126*  --  130* 128* 128*  K 4.1   < >  --  5.5* 5.6* 4.6 4.5 4.4 4.4  CL 102  --   --  102  --   --  101  --   --   CO2 14*  --   --  14*  --   --  11*  --   --   GLUCOSE 153*  --   --  88  --   --  252*  --   --   BUN 15  --   --  17  --   --  17  --   --   CREATININE 1.52*  --   --  1.71*  --   --  2.13*  --   --   CALCIUM 8.0*  --   --  8.1*  --   --  7.4*  --   --   MG  --   --  1.9  --   --   --  2.3  --   --    < > = values in this interval not displayed.    GFR: Estimated Creatinine Clearance: 27.8 mL/min (A) (by C-G formula based on SCr of 2.13 mg/dL (H)). Recent Labs  Lab 07/30/2021 1226 08/05/2021 1857 07/30/21 0351  WBC 9.2  --  5.4  LATICACIDVEN 7.2* 7.6*  --      Liver Function Tests: Recent Labs  Lab 08/24/2021 1226  AST 298*  ALT 132*  ALKPHOS 148*  BILITOT 0.4  PROT 5.1*  ALBUMIN 2.8*    No results for input(s): LIPASE, AMYLASE in the last 168 hours. No results for input(s): AMMONIA in the last 168 hours.  ABG    Component Value Date/Time   PHART 7.232 (L) 07/30/2021 0852   PCO2ART 35.7 07/30/2021 0852   PO2ART 136 (H) 07/30/2021 0852   HCO3 14.9 (L) 07/30/2021 0852   TCO2 16 (L) 07/30/2021 0852   ACIDBASEDEF 12.0 (H) 07/30/2021 0852   O2SAT 98.5 07/30/2021 0941      Coagulation Profile: No results for input(s): INR, PROTIME in the last 168 hours.  Cardiac  Enzymes: No results for input(s): CKTOTAL, CKMB, CKMBINDEX, TROPONINI in the last 168 hours.  HbA1C: No results found for: HGBA1C  CBG: Recent Labs  Lab 08/14/2021 2023 07/30/21 0006 07/30/21 0411 07/30/21 0808  GLUCAP 62* 176* 203* 216*    My independent critical care time x 45 m  Kara Mead MD. FCCP. [facility] Pager : [phone]  If no response to pager , please call [phone] until 7 pm After 7:00 pm call Elink  [phone]   07/30/2021

## 2021-07-30 NOTE — Progress Notes (Signed)
RT called and reported critical ABG results to eLink at this time.

## 2021-07-30 NOTE — Progress Notes (Signed)
Contacted E-link in regards to patients pressure coming down with a map of 50. Requesting an order for levo or the go ahead to restart Epi gtt. Will await orders at this time.

## 2021-07-30 NOTE — Progress Notes (Signed)
ABG obtained on ventilator settings of VT: 450, RR: 26, FIO2: 50%, and PEEP: 10.  FIO2 weaned to 40%.  Will continue to monitor.    Ref. Range 07/30/2021 08:52  Sample type Unknown ARTERIAL  pH, Arterial Latest Ref Range: 7.350 - 7.450  7.232 (L)  pCO2 arterial Latest Ref Range: 32.0 - 48.0 mmHg 35.7  pO2, Arterial Latest Ref Range: 83.0 - 108.0 mmHg 136 (H)  TCO2 Latest Ref Range: 22 - 32 mmol/L 16 (L)  Acid-base deficit Latest Ref Range: 0.0 - 2.0 mmol/L 12.0 (H)  Bicarbonate Latest Ref Range: 20.0 - 28.0 mmol/L 14.9 (L)  O2 Saturation Latest Units: % 98.0  Patient temperature Unknown 99.7 F  Collection site Unknown art line

## 2021-07-30 NOTE — Progress Notes (Signed)
[facility] Progress Note Patient Name: Tara [name] DOB: [birth year] MRN: [id number]   Date of Service  07/30/2021  HPI/Events of Note  Pt still quite acidemic with a 'relative' resp acidosis (with failure to bring down her PaCO2 as expected)  eICU Interventions  - change from LR to a bicarb infusion - consider inc'ing Vt if next blood gas still poor     Intervention Category Major Interventions: Acid-Base disturbance - evaluation and management  Tilden Dome 07/30/2021, 4:04 AM

## 2021-07-30 NOTE — Progress Notes (Signed)
Patient intubated and on 2 of levo at change of shift. No other gtts started. Right triple IJ recently inserted and verified per day nurse. Patient still needing many orders completed, art line needed, sedation and increase in pressors due to current map being in 30's. Unable to obtain O2 saturation reading at this time due to poor circulation of patient. Skin assessment to be performed with writer and night nurse at later time once patient is stable. OG needing to be placed per Order. Husband at bedside, writer able to answer questions of husband at bedside and son over the phone. Contact info given to husband for any further questions. Password placed in flow sheet. See flow sheet for any further documentation.

## 2021-07-31 ENCOUNTER — Encounter (HOSPITAL_COMMUNITY): Payer: BC Managed Care – PPO

## 2021-07-31 ENCOUNTER — Inpatient Hospital Stay (HOSPITAL_COMMUNITY): Payer: BC Managed Care – PPO

## 2021-07-31 DIAGNOSIS — K7200 Acute and subacute hepatic failure without coma: Secondary | ICD-10-CM | POA: Diagnosis not present

## 2021-07-31 DIAGNOSIS — I4901 Ventricular fibrillation: Secondary | ICD-10-CM | POA: Diagnosis not present

## 2021-07-31 DIAGNOSIS — R778 Other specified abnormalities of plasma proteins: Secondary | ICD-10-CM

## 2021-07-31 DIAGNOSIS — I2510 Atherosclerotic heart disease of native coronary artery without angina pectoris: Secondary | ICD-10-CM | POA: Diagnosis not present

## 2021-07-31 DIAGNOSIS — U071 COVID-19: Secondary | ICD-10-CM | POA: Diagnosis not present

## 2021-07-31 DIAGNOSIS — I469 Cardiac arrest, cause unspecified: Secondary | ICD-10-CM | POA: Diagnosis not present

## 2021-07-31 DIAGNOSIS — N179 Acute kidney failure, unspecified: Secondary | ICD-10-CM | POA: Diagnosis not present

## 2021-07-31 LAB — CBC
HCT: 29 % — ABNORMAL LOW (ref 36.0–46.0)
Hemoglobin: 9.6 g/dL — ABNORMAL LOW (ref 12.0–15.0)
MCH: 33.3 pg (ref 26.0–34.0)
MCHC: 33.1 g/dL (ref 30.0–36.0)
MCV: 100.7 fL — ABNORMAL HIGH (ref 80.0–100.0)
Platelets: 47 10*3/uL — ABNORMAL LOW (ref 150–400)
RBC: 2.88 MIL/uL — ABNORMAL LOW (ref 3.87–5.11)
RDW: 11.9 % (ref 11.5–15.5)
WBC: 3 10*3/uL — ABNORMAL LOW (ref 4.0–10.5)
nRBC: 0.7 % — ABNORMAL HIGH (ref 0.0–0.2)

## 2021-07-31 LAB — GLUCOSE, CAPILLARY
Glucose-Capillary: 114 mg/dL — ABNORMAL HIGH (ref 70–99)
Glucose-Capillary: 159 mg/dL — ABNORMAL HIGH (ref 70–99)
Glucose-Capillary: 42 mg/dL — CL (ref 70–99)
Glucose-Capillary: 77 mg/dL (ref 70–99)
Glucose-Capillary: 85 mg/dL (ref 70–99)
Glucose-Capillary: 86 mg/dL (ref 70–99)
Glucose-Capillary: 97 mg/dL (ref 70–99)
Glucose-Capillary: <10 mg/dL — CL (ref 70–99)

## 2021-07-31 LAB — POCT I-STAT 7, (LYTES, BLD GAS, ICA,H+H)
Acid-base deficit: 6 mmol/L — ABNORMAL HIGH (ref 0.0–2.0)
Bicarbonate: 19.8 mmol/L — ABNORMAL LOW (ref 20.0–28.0)
Calcium, Ion: 1.01 mmol/L — ABNORMAL LOW (ref 1.15–1.40)
HCT: 29 % — ABNORMAL LOW (ref 36.0–46.0)
Hemoglobin: 9.9 g/dL — ABNORMAL LOW (ref 12.0–15.0)
O2 Saturation: 99 %
Patient temperature: 37.7
Potassium: 4.1 mmol/L (ref 3.5–5.1)
Sodium: 129 mmol/L — ABNORMAL LOW (ref 135–145)
TCO2: 21 mmol/L — ABNORMAL LOW (ref 22–32)
pCO2 arterial: 38.1 mmHg (ref 32.0–48.0)
pH, Arterial: 7.326 — ABNORMAL LOW (ref 7.350–7.450)
pO2, Arterial: 140 mmHg — ABNORMAL HIGH (ref 83.0–108.0)

## 2021-07-31 LAB — COMPREHENSIVE METABOLIC PANEL
ALT: 1600 U/L — ABNORMAL HIGH (ref 0–44)
AST: >10000 U/L — ABNORMAL HIGH (ref 15–41)
Albumin: 2.3 g/dL — ABNORMAL LOW (ref 3.5–5.0)
Alkaline Phosphatase: 125 U/L (ref 38–126)
Anion gap: 14 (ref 5–15)
BUN: 23 mg/dL — ABNORMAL HIGH (ref 6–20)
CO2: 20 mmol/L — ABNORMAL LOW (ref 22–32)
Calcium: 7.3 mg/dL — ABNORMAL LOW (ref 8.9–10.3)
Chloride: 97 mmol/L — ABNORMAL LOW (ref 98–111)
Creatinine, Ser: 2.93 mg/dL — ABNORMAL HIGH (ref 0.44–1.00)
GFR, Estimated: 19 mL/min — ABNORMAL LOW (ref >60)
Glucose, Bld: 126 mg/dL — ABNORMAL HIGH (ref 70–99)
Potassium: 3.9 mmol/L (ref 3.5–5.1)
Sodium: 131 mmol/L — ABNORMAL LOW (ref 135–145)
Total Bilirubin: 2.7 mg/dL — ABNORMAL HIGH (ref 0.3–1.2)
Total Protein: 4.5 g/dL — ABNORMAL LOW (ref 6.5–8.1)

## 2021-07-31 LAB — TROPONIN I (HIGH SENSITIVITY): Troponin I (High Sensitivity): 2387 ng/L (ref <18)

## 2021-07-31 LAB — URINE CULTURE: Culture: NO GROWTH

## 2021-07-31 LAB — PHOSPHORUS: Phosphorus: 3.9 mg/dL (ref 2.5–4.6)

## 2021-07-31 LAB — HEPARIN LEVEL (UNFRACTIONATED): Heparin Unfractionated: 0.32 IU/mL (ref 0.30–0.70)

## 2021-07-31 IMAGING — DX DG ABDOMEN 1V
1 series · 1 of 1 positions shown · non-contrast
Comparison: Abdominal radiograph [DATE]

CLINICAL DATA: Orogastric tube placement.

EXAM:
ABDOMEN - 1 VIEW

[abdomen kub]
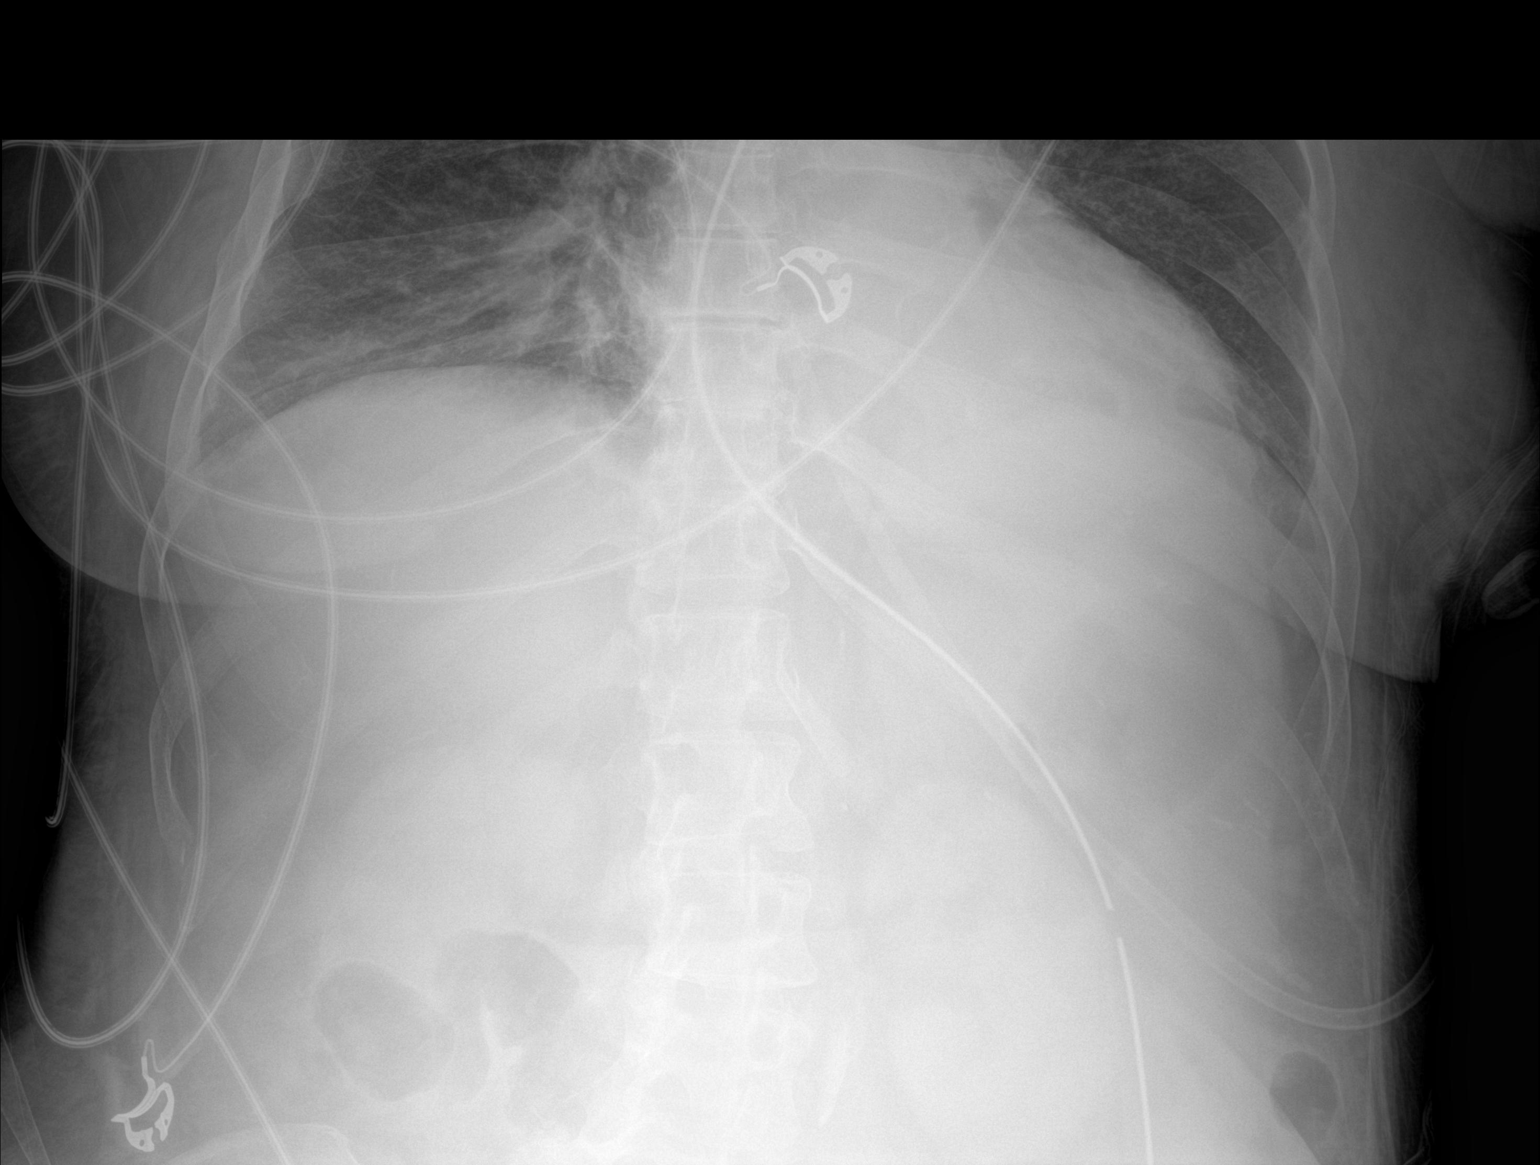

[1 of 1 positions shown; findings below may reference images not displayed]

FINDINGS: Enteric tube tip is below the diaphragm but not included on the
image, with side hole projecting at the expected location of the
gastric body. Small amount of scattered air is seen within the
stomach, small bowel and colon.

Retrocardiac left lower lobe opacity.
IMPRESSION: Interval advancement of the enteric tube. The tip of the enteric
tube is not included on the image but likely terminates at the level
of the left mid abdomen. The side hole projects at the expected
location of the gastric body.

Retrocardiac left lower lobe airspace opacity, likely atelectasis.

## 2021-07-31 MED ORDER — DEXTROSE 50 % IV SOLN
INTRAVENOUS | Status: AC
  Filled 2021-07-31: qty 50

## 2021-07-31 MED ORDER — PANTOPRAZOLE 2 MG/ML SUSPENSION
40.0000 mg | Freq: Every day | ORAL | Status: DC
  Administered 2021-07-31 – 2021-08-01 (×2): 40 mg
  Filled 2021-07-31 (×2): qty 20

## 2021-07-31 MED ORDER — PROSOURCE TF PO LIQD
45.0000 mL | Freq: Two times a day (BID) | ORAL | Status: DC
  Administered 2021-07-31 – 2021-08-01 (×3): 45 mL
  Filled 2021-07-31 (×3): qty 45

## 2021-07-31 MED ORDER — HEPARIN SODIUM (PORCINE) 5000 UNIT/ML IJ SOLN
5000.0000 [IU] | Freq: Two times a day (BID) | INTRAMUSCULAR | Status: DC
  Administered 2021-08-01 – 2021-08-02 (×3): 5000 [IU] via SUBCUTANEOUS
  Filled 2021-07-31 (×4): qty 1

## 2021-07-31 MED ORDER — PROSOURCE TF PO LIQD: 45.0000 mL | Freq: Three times a day (TID) | ORAL | Status: DC

## 2021-07-31 MED ORDER — POLYETHYLENE GLYCOL 3350 17 G PO PACK: 17.0000 g | PACK | Freq: Every day | ORAL | Status: DC | PRN

## 2021-07-31 MED ORDER — DEXAMETHASONE 4 MG PO TABS
6.0000 mg | ORAL_TABLET | Freq: Every day | ORAL | Status: DC
  Administered 2021-07-31 – 2021-08-02 (×3): 6 mg
  Filled 2021-07-31 (×4): qty 2

## 2021-07-31 MED ORDER — VITAL 1.5 CAL PO LIQD
1000.0000 mL | ORAL | Status: DC
  Administered 2021-07-31 – 2021-08-01 (×2): 1000 mL
  Filled 2021-07-31: qty 1000

## 2021-07-31 NOTE — Progress Notes (Signed)
RT NOTES: While moving ET tube, bottom 2 teeth fell out. Placed in specimen cup and notified RN.

## 2021-07-31 NOTE — Progress Notes (Signed)
Nutrition Follow-up  DOCUMENTATION CODES:   Not applicable  INTERVENTION:   - Recommend advancing OG tube and obtaining repeat abdominal x-ray as most recent imaging shows OG tube with side port at GE junction, discussed with RN  Tube feeding via OG tube: - Advance Vital 1.5 to goal rate of 50 ml/hr (1200 ml/day) - ProSource TF 45 ml BID  Tube feeding regimen provides 1880 kcal, 114 grams of protein, and 917 ml of H2O.   NUTRITION DIAGNOSIS:   Inadequate oral intake related to inability to eat as evidenced by NPO status.  Ongoing, being addressed via TF  GOAL:   Patient will meet greater than or equal to 90% of their needs  Met via TF  MONITOR:   Vent status, Labs, Weight trends, Skin, I & O's, TF tolerance  REASON FOR ASSESSMENT:   Consult Enteral/tube feeding initiation and management  ASSESSMENT:   Tara [name] is a 55 year old female who suffered a ventricular fibrillation cardiac arrest at home.  CPR was started by her husband.  There is a 30 to 60-day history of fatigue, weakness, and tingling in her feet.  She was seen in the neurology office today and after arriving home from the appointment, she was found by her husband not breathing.  CPR was started by her husband, the fire department arrived and delivered 2 shocks based upon algorithm, and slightly later EMS arrived, found her in VF, and did an additional shock with return to normal sinus rhythm.  Time to ROSC 20 minutes.  Discussed pt with RN and during ICU rounds. Consult received to advance tube feeds to goal. Tube feeds currently infusing at trickle rate. Per abdominal and chest x-rays from 11/02, pt with OG tube side port at the GE junction. Discussed with RN who is going to advance OG tube and obtain x-ray confirmation.  Pt with cardiogenic shock, acute anoxic encephalopathy, and acute respiratory failure in setting of cardiac arrest. Pt tested positive for COVID-19 which was an incidental finding. Pt also  with AKI. Per CCM, expect UOP to start improving soon.  Current TF: Vital 1.5 @ 20 ml/hr, ProSource TF 90 ml BID  Admit weight: 66.6 kg Current weight: 71.1 kg  Patient is currently intubated on ventilator support MV: 11.6 L/min Temp (24hrs), Avg:99.8 F (37.7 C), Min:97.9 F (36.6 C), Max:100.2 F (37.9 C)  Drips: Fentanyl Sodium bicarb: 50 ml/hr  Medications reviewed and include: decadron, colace, SSI q 4 hours, protonix, miralax, IV abx  Labs reviewed: sodium 131, BUN 23, creatinine 2.93, elevated LFTs, hemoglobin 9.6, platelets 47 CBG's: 62-159 x 24 hours  UOP: 190 ml x 24 hours I/O's: +5.8 L since admit  Diet Order:   Diet Order             Diet NPO time specified  Diet effective now                   EDUCATION NEEDS:   Not appropriate for education at this time  Skin:  Skin Assessment: Reviewed RN Assessment  Last BM:  no documented BM  Height:   Ht Readings from Last 1 Encounters:  08/11/2021 _0  (1.651 m)    Weight:   Wt Readings from Last 1 Encounters:  07/31/21 71.1 kg    Ideal Body Weight:  56.8 kg  BMI:  Body mass index is 26.08 kg/m.  Estimated Nutritional Needs:   Kcal:  1700-1900  Protein:  100-130 grams  Fluid:  1.7-1.9 L  Gustavus Bryant, MS, RD, LDN Inpatient Clinical Dietitian Please see AMiON for contact information.

## 2021-07-31 NOTE — Progress Notes (Addendum)
Updated spouse at bedside on plan of care and patient status. All questions answered at this time.   Brother, Rush Landmark, call returned- no answer.    MD at bedside for AM rounds- notified of cold, doppler pulses. No pedal pulse on Right LE, collateral dor. tib. Pulse noted on doppler. Per MD, keep patient afebrile, no tylenol per LFTs. Artic Sun pads are not needed at this time, use cooling blanket as needed.   1125- per RT 2 teeth fell out

## 2021-07-31 NOTE — Progress Notes (Signed)
IO access to left anterior proximal tibia removed. Insertion site covered with vaseline gauze, 2x2 gauze, and a tegaderm.

## 2021-07-31 NOTE — Progress Notes (Addendum)
NAME:  Tara [name], MRN:  [id number], DOB:  June 08, 1966, LOS: 2 ADMISSION DATE:  08/25/2021, CONSULTATION DATE:  07/31/2021 REFERRING MD:  Maryan Rued, CHIEF COMPLAINT:  Post Cardiac Arrest   History of Present Illness:  Patient is a 55 year old female with a history of hypertension, recent issues with lower extremity weakness, shaking and recent weight loss presenting today as a cardiac arrest.  Patient's husband and EMS give the history.  Husband reports that over the last month or 2 patient has had worsening lower extremity weakness, shakiness, tingling in her legs and saw neurology for the first time today.  They had ordered an MRI and they had come home and she went inside and sat down.  Her husband went out to talk with the plumber and 5 minutes later came back in and noticed she was unresponsive sitting in the chair.  He did not notice that she was breathing and called 911.  He did start bystander CPR.  The call came in at 1115.  Fire arrived at 1118 and on their arrival they initiated CPR.  AED did suggest shock as patient was in V. fib.  She was shocked x2 when EMS arrived she was still in V. fib received 1 mg of epi and a third shock which put her back into sinus rhythm.  She was intubated with a 7.0 tube and did have spontaneous breaths.  Husband denies patient recently having any illnesses or complaining of chest pain or shortness of breath.  For the last month she had complained of fatigue.  She had still been eating and drinking.  She does not drink alcohol regularly but husband reports she does smoke heavily. CPR x 20 minutes  Upon arrival to the ED patient was Covid +, which was an incidental finding Na 130/ K 4.1 initially, then 5.2, Creatinine 1.52, Alk Phos 148, AST 290/ ALT 132/ Total Protein 5.1/ Troponin 48/ Lactate 7.2  She was intubated in the ED at 12:34 pm, ABG at 14:10 was 7.19/ 35/82/15/13/13.6,  This was on a rate of 16. She was overbreathing the vent. Saturation was 93%  WBC  is 9.2/ HGB is 10.8/ platelets are 73K  CT head was negative for acute stroke / global anoxic damage. 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   Pertinent  Medical History   Past Medical History:  Diagnosis Date   Anxiety state, unspecified    Clavicle fracture    right   Depression    Essential hypertension, benign    History of pneumonia    Heavy smoker  Significant Hospital Events: Including procedures, antibiotic start and stop dates in addition to other pertinent events   08/14/2021 Admission post v fib arrest ,Flail chest added PEEP 11/2  on epi gtt, acidosis corrected by bicarb  Interim History / Subjective:   Critically ill but stable, epinephrine tapered to off. Levophed started low-dose overnight but tapered off this morning, on vasopressin. Urine output remains poor. Low-grade febrile On fentanyl drip for sedation  Objective   Blood pressure (!) 113/53, pulse 66, temperature 99.7 F (37.6 C), resp. rate (!) 26, height $RemoveBe'5\' 5"'UemtuaPne$  (1.651 m), weight 71.1 kg, last menstrual period 08/26/2011, SpO2 100 %.    Vent Mode: PRVC FiO2 (%):  [40 %] 40 % Set Rate:  [26 bmp] 26 bmp Vt Set:  [450 mL]  450 mL PEEP:  [5 cmH20-10 cmH20] 5 cmH20 Plateau Pressure:  [21 cmH20-23 cmH20] 21 cmH20   Intake/Output Summary (Last 24 hours) at 07/31/2021 0941 Last data filed at 07/31/2021 0503 Gross per 24 hour  Intake 2729.46 ml  Output 140 ml  Net 2589.46 ml    Filed Weights   08/03/2021 1938 07/30/21 0500 07/31/21 0500  Weight: 66.6 kg 66.6 kg 71.1 kg    Examination:  Middle-aged woman, intubated, sedated on  fentanyl, no distress Mild pallor, no icterus Oral ETT, minimal secretions No accessory muscle use, bilateral air entry present, no appearance of flail chest on dropping PEEP to 5 S1-S2 regular, mild  bradycardia Soft, nontender abdomen, no organomegaly RASS -1, opens eyes to name and touch, does not follow commands Purple feet and toes   ABG shows much improved metabolic acidosis. Chest x-ray independently reviewed shows lines and tubes in position, mild interstitial infiltrates.  Labs show increase in creatinine, hyponatremia, elevated LFTs, high troponin, mild leukopenia and severe thrombocytopenia  Resolved Hospital Problem list     Assessment & Plan:  Post V Fib Arrest,Shocked x 3 before ROSC'20 minutes CPR , etiology not clear possible primary arrhythmia Cardiogenic shock EF down to 30%, global hypokinesis, good co-ox Elevated troponins postarrest, cannot rule out ischemic etiology, has coronary artery calcification Plan Cardiology  following Off amiodarone. Can DC heparin in my opinion but defer to cardiology , may need cath depending on neurological outcome   Acute respiratory failure in setting of Cardiac Arrest Flail chest Heavy Smoker Bibasal pneumonia possible aspiration   Plan PEEP dropped to 5 Can start spontaneous breathing trials Unasyn  Xopenex nebs prn  Covid + ( Incidental Finding on admission )  DC remdesivir due to elevated LFTs Start dexamethasone 6 mg for 10 days    Acute anoxic encephalopathy CT Head with no acute stroke or global anoxia EEG negative Prior to admission concern for cervical myelopathy Plan Normothermia protocol -aggressive use of cooling packs if temperature rises more than 100, cannot use Tylenol Frequent neuro checks Minimize fentanyl as able,RASS score 0 MRI brain and C-spine once hemodynamically improved  AKI , likely ATN postarrest Hyponatremia Hypocalcemia  Plan Monitor UO , expect to start improving soon Strict I&O Trend BMET   Thrombocytopenia , likely related to EtOH use Plan Trend CBC Monitor for bleeding / bruising , may have to stop IV heparin Transfuse if symptomatic  Shock liver -follow  LFTs  Ischemic appearing toes -able to Doppler pulses, chronic per husband ?  PAD related to smoking -Obtain arterial Doppler  Updated husband at bedside  Best Practice (right click and "Reselect all SmartList Selections" daily)   Diet/type: tubefeeds DVT prophylaxis: systemic heparin GI prophylaxis: PPI Lines: N/A Foley:  Yes, and it is still needed Code Status: Limited code Last date of multidisciplinary goals of care discussion [11/2] -mom Vaughan Basta and husband , they agree to no more CPR but continue current life support measures  Labs   CBC: Recent Labs  Lab 08/25/2021 1226 08/26/2021 1410 08/15/2021 2135 07/30/21 0351 07/30/21 0355 07/30/21 0852 07/31/21 0105  WBC 9.2  --   --  5.4  --   --  3.0*  NEUTROABS 5.2  --   --   --   --   --   --   HGB 10.8*   < > 10.9* 9.9* 10.2* 9.9* 9.6*  HCT 34.3*   < > 32.0* 31.3* 30.0* 29.0* 29.0*  MCV 107.5*  --   --  107.6*  --   --  100.7*  PLT 73*  --   --  65*  --   --  47*   < > = values in this interval not displayed.     Basic Metabolic Panel: Recent Labs  Lab 08/12/2021 1226 08/21/2021 1410 08/13/2021 1530 08/10/2021 1800 08/13/2021 2135 07/30/21 0019 07/30/21 0351 07/30/21 0355 07/30/21 0852 07/30/21 1343 07/30/21 1849 07/31/21 0105  NA 130*   < >  --  131* 126*  --  130* 128* 128*  --   --  131*  K 4.1   < >  --  5.5* 5.6* 4.6 4.5 4.4 4.4  --   --  3.9  CL 102  --   --  102  --   --  101  --   --   --   --  97*  CO2 14*  --   --  14*  --   --  11*  --   --   --   --  20*  GLUCOSE 153*  --   --  88  --   --  252*  --   --   --   --  126*  BUN 15  --   --  17  --   --  17  --   --   --   --  23*  CREATININE 1.52*  --   --  1.71*  --   --  2.13*  --   --   --   --  2.93*  CALCIUM 8.0*  --   --  8.1*  --   --  7.4*  --   --   --   --  7.3*  MG  --   --  1.9  --   --   --  2.3  --   --   --   --   --   PHOS  --   --   --   --   --   --   --   --   --  4.7* 4.3 3.9   < > = values in this interval not displayed.     GFR: Estimated Creatinine Clearance: 22.2 mL/min (A) (by C-G formula based on SCr of 2.93 mg/dL (H)). Recent Labs  Lab 08/03/2021 1226 08/25/2021 1857 07/30/21 0351 07/30/21 0941 07/31/21 0105  WBC 9.2  --  5.4  --  3.0*  LATICACIDVEN 7.2* 7.6*  --  6.4*  --      Liver Function Tests: Recent Labs  Lab 08/21/2021 1226 07/31/21 0105  AST 298* >10,000*  ALT 132* 1,600*  ALKPHOS 148* 125  BILITOT 0.4 2.7*  PROT 5.1* 4.5*  ALBUMIN 2.8* 2.3*    No results for input(s): LIPASE, AMYLASE in the last 168 hours. No results for input(s): AMMONIA in the last 168 hours.  ABG    Component Value Date/Time   PHART 7.232 (L) 07/30/2021 0852   PCO2ART 35.7 07/30/2021 0852   PO2ART 136 (H) 07/30/2021 0852   HCO3 14.9 (L) 07/30/2021 0852   TCO2 16 (L) 07/30/2021 0852   ACIDBASEDEF 12.0 (H) 07/30/2021 0852   O2SAT 98.5 07/30/2021 0941      Coagulation Profile: No results for input(s): INR, PROTIME in the last 168 hours.  Cardiac Enzymes: No results for input(s): CKTOTAL, CKMB, CKMBINDEX, TROPONINI in the last 168 hours.  HbA1C: No results found for: HGBA1C  CBG: Recent Labs  Lab 07/30/21 1953  07/30/21 2347 07/31/21 0028 07/31/21 0415 07/31/21 0716  GLUCAP 72 62* 159* 77 85     My independent critical care time x 35 m  Kara Mead MD. FCCP.  Pulmonary & Critical care Pager : [phone]  If no response to pager , please call [phone] until 7 pm After 7:00 pm call Elink  [phone]   07/31/2021

## 2021-07-31 NOTE — Progress Notes (Signed)
RT NOTES: Removed patient's top dentures and placed in denture cup with water. RN notified.

## 2021-07-31 NOTE — Progress Notes (Signed)
ANTICOAGULATION CONSULT NOTE Pharmacy Consult for Heparin Indication: chest pain/ACS  Allergies  Allergen Reactions   Codeine Hives   Effexor [Venlafaxine]    Sulfa Antibiotics Hives   Sulfasalazine Hives and Itching   Wellbutrin [Bupropion]     dizziness    Patient Measurements: Height: 5\' 5"  (165.1 cm) Weight: 66.6 kg (146 lb 13.2 oz) IBW/kg (Calculated) : 57 Heparin Dosing Weight: 63 kg  Vital Signs: Temp: 99.7 F (37.6 C) (11/03 0045) Temp Source: Bladder (11/03 0000) BP: 139/70 (11/02 2040) Pulse Rate: 80 (11/03 0030)  Labs: Recent Labs    08/25/2021 1226 08/03/2021 1410 08/09/2021 1426 08/02/2021 1800 08/23/2021 2135 07/30/21 0351 07/30/21 0355 07/30/21 0852 07/30/21 1040 07/30/21 1343 07/31/21 0105  HGB 10.8*   < >  --   --    < > 9.9* 10.2* 9.9*  --   --  9.6*  HCT 34.3*   < >  --   --    < > 31.3* 30.0* 29.0*  --   --  29.0*  PLT 73*  --   --   --   --  65*  --   --   --   --  47*  HEPARINUNFRC  --   --   --   --   --  <0.10*  --   --   --  0.15* 0.32  CREATININE 1.52*  --   --  1.71*  --  2.13*  --   --   --   --  2.93*  TROPONINIHS 48*  --  335*  --   --   --   --   --  3,119*  --   --    < > = values in this interval not displayed.     Estimated Creatinine Clearance: 20.2 mL/min (A) (by C-G formula based on SCr of 2.93 mg/dL (H)).  Assessment: 55 y.o. female admitted s/p cardiac arrest, possible ACS, for heparin   Goal of Therapy:  Heparin level 0.3 - 0.5 units/ml.  Monitor platelets by anticoagulation protocol: Yes   Plan:  Continue Heparin at current rate   Phillis Knack, PharmD, BCPS  07/31/2021 2:37 AM

## 2021-07-31 NOTE — Progress Notes (Signed)
1122 and 1129 blood sugars inaccurate. Poor Patient circulation, blood taken from Arterial line and WDL.

## 2021-07-31 NOTE — Telephone Encounter (Signed)
Just an FYI  I am unable to get a hold of the patient I have left two voicemail's, it looks like she has been admitted in the hospital since 08/23/2021.

## 2021-07-31 NOTE — Progress Notes (Addendum)
Progress Note  Patient Name: Tara [name] Date of Encounter: 07/31/2021  [facility] Cardiologist: None new  Subjective   Intubated and not responsive.  Inpatient Medications    Scheduled Meds:  atropine  1 mg Intravenous Once   chlorhexidine gluconate (MEDLINE KIT)  15 mL Mouth Rinse BID   Chlorhexidine Gluconate Cloth  6 each Topical Daily   dexamethasone  6 mg Per Tube Daily   docusate  100 mg Per Tube BID   feeding supplement (PROSource TF)  90 mL Per Tube TID   insulin aspart  0-6 Units Subcutaneous Q4H   mouth rinse  15 mL Mouth Rinse 10 times per day   pantoprazole (PROTONIX) IV  40 mg Intravenous QHS   polyethylene glycol  17 g Per Tube Daily   sodium chloride flush  10-40 mL Intracatheter Q12H   Continuous Infusions:  sodium chloride     ampicillin-sulbactam (UNASYN) IV 3 g (07/31/21 0815)   feeding supplement (VITAL 1.5 CAL) 1,000 mL (07/30/21 1427)   fentaNYL infusion INTRAVENOUS 200 mcg/hr (07/31/21 0500)   norepinephrine (LEVOPHED) Adult infusion Stopped (07/31/21 0530)   propofol (DIPRIVAN) infusion 10 mcg/kg/min (07/30/21 0944)    sodium bicarbonate (isotonic) infusion in sterile water 50 mL/hr at 07/31/21 0812   vasopressin 0.04 Units/min (07/31/21 0813)   PRN Meds: acetaminophen, docusate, fentaNYL (SUBLIMAZE) injection, fentaNYL (SUBLIMAZE) injection, levalbuterol, ondansetron (ZOFRAN) IV, polyethylene glycol, sodium chloride flush   Vital Signs    Vitals:   07/31/21 0800 07/31/21 0844 07/31/21 0921 07/31/21 0922  BP:  (!) 113/53    Pulse: 71 71 66   Resp: (!) 26 (!) 26  (!) 26  Temp: 97.9 F (36.6 C) 99.7 F (37.6 C)    TempSrc: Axillary     SpO2: 100% 100% 100%   Weight:      Height:        Intake/Output Summary (Last 24 hours) at 07/31/2021 0952 Last data filed at 07/31/2021 0503 Gross per 24 hour  Intake 2729.46 ml  Output 140 ml  Net 2589.46 ml   Last 3 Weights 07/31/2021 07/30/2021 08/13/2021  Weight (lbs) 156 lb 12 oz 146 lb  13.2 oz 146 lb 13.2 oz  Weight (kg) 71.1 kg 66.6 kg 66.6 kg      Telemetry    No ventricular arrhythmias noted.- Personally Reviewed  ECG    Most recent EKG at 12:54 PM on 08/17/2021.- Personally Reviewed  Physical Exam  Not examined.  Labs    High Sensitivity Troponin:   Recent Labs  Lab 08/23/2021 1226 08/11/2021 1426 07/30/21 1040  TROPONINIHS 48* 335* 3,119*     Chemistry Recent Labs  Lab 08/15/2021 1226 08/20/2021 1410 08/19/2021 1530 08/03/2021 1800 08/08/2021 2135 07/30/21 0351 07/30/21 0355 07/30/21 0852 07/31/21 0105  NA 130*   < >  --  131*   < > 130* 128* 128* 131*  K 4.1   < >  --  5.5*   < > 4.5 4.4 4.4 3.9  CL 102  --   --  102  --  101  --   --  97*  CO2 14*  --   --  14*  --  11*  --   --  20*  GLUCOSE 153*  --   --  88  --  252*  --   --  126*  BUN 15  --   --  17  --  17  --   --  23*  CREATININE 1.52*  --   --  1.71*  --  2.13*  --   --  2.93*  CALCIUM 8.0*  --   --  8.1*  --  7.4*  --   --  7.3*  MG  --   --  1.9  --   --  2.3  --   --   --   PROT 5.1*  --   --   --   --   --   --   --  4.5*  ALBUMIN 2.8*  --   --   --   --   --   --   --  2.3*  AST 298*  --   --   --   --   --   --   --  >10,000*  ALT 132*  --   --   --   --   --   --   --  1,600*  ALKPHOS 148*  --   --   --   --   --   --   --  125  BILITOT 0.4  --   --   --   --   --   --   --  2.7*  GFRNONAA 41*  --   --  36*  --  27*  --   --  19*  ANIONGAP 14  --   --  15  --  18*  --   --  14   < > = values in this interval not displayed.    Lipids  Recent Labs  Lab 07/30/21 0351  TRIG 234*    Hematology Recent Labs  Lab 08/14/2021 1226 08/25/2021 1410 07/30/21 0351 07/30/21 0355 07/30/21 0852 07/31/21 0105  WBC 9.2  --  5.4  --   --  3.0*  RBC 3.19*  --  2.91*  --   --  2.88*  HGB 10.8*   < > 9.9* 10.2* 9.9* 9.6*  HCT 34.3*   < > 31.3* 30.0* 29.0* 29.0*  MCV 107.5*  --  107.6*  --   --  100.7*  MCH 33.9  --  34.0  --   --  33.3  MCHC 31.5  --  31.6  --   --  33.1  RDW 11.9  --   12.2  --   --  11.9  PLT 73*  --  65*  --   --  47*   < > = values in this interval not displayed.   Thyroid  Recent Labs  Lab 08/22/2021 1630  TSH 1.435    BNP Recent Labs  Lab 08/07/2021 1836  BNP 300.2*    DDimer  Recent Labs  Lab 07/31/2021 1530  DDIMER >20.00*     Radiology    DG Abd 1 View  Result Date: 07/30/2021 CLINICAL DATA:  OG tube placement EXAM: ABDOMEN - 1 VIEW COMPARISON:  07/31/2021 FINDINGS: Enteric tube terminates in the gastric cardia with its side port in the distal esophagus. IMPRESSION: Enteric tube terminates in the gastric cardia with its side port in the distal esophagus. Electronically Signed   By: Julian Hy M.D.   On: 07/30/2021 00:37   DG Abd 1 View  Result Date: 08/21/2021 CLINICAL DATA:  OG tube placement EXAM: ABDOMEN - 1 VIEW COMPARISON:  None. FINDINGS: OG tube loops on itself in the distal esophagus with the tip in the distal esophagus. Right base atelectasis or infiltrate. Nonobstructive bowel gas pattern. IMPRESSION: NG tube loops on itself with  the tip in the distal esophagus. Electronically Signed   By: Rolm Baptise M.D.   On: 08/09/2021 23:26   CT Head Wo Contrast  Result Date: 08/05/2021 CLINICAL DATA:  Altered mental status, status post cardiac arrest EXAM: CT HEAD WITHOUT CONTRAST TECHNIQUE: Contiguous axial images were obtained from the base of the skull through the vertex without intravenous contrast. COMPARISON:  02/28/2016 FINDINGS: Brain: A 0.7 by 0.5 cm hypodensity favoring chronic lacunar infarct is present in the right periventricular white matter on image 18 series 3. This has a chronic appearance, but was not present on the prior CT scan from 02/28/2016. There is well maintained gray-white differentiation with continued normal conspicuity of the basal ganglia and thalami. Mild progressive sulcal prominence particularly along the vertex, mildly advanced for age. No intracranial hemorrhage, mass lesion, or acute CVA. Vascular:  There is atherosclerotic calcification of the cavernous carotid arteries bilaterally. Skull: Unremarkable Sinuses/Orbits: Chronic mucosal thickening in the maxillary sinuses, ethmoid air cells, and sphenoid sinuses noted compatible with chronic sinusitis. There are air fluid levels in the sphenoid sinuses which could reflect superimposed acute sinusitis but may also be alternatively related to the patient's intubation. Other: No supplemental non-categorized findings. IMPRESSION: 1. No compelling findings of acute stroke/global anoxic injury. No intracranial hemorrhage or acute intracranial findings. 2. A small chronic lacunar infarct in the right periventricular white matter is noted. Although this has chronic characteristics, it was not present on the 02/28/2016 CT scan. 3. Mildly age advanced cerebral atrophy manifesting as sulcal prominence along the vertex. 4. Chronic paranasal sinusitis. Air-fluid levels in the sphenoid sinuses could be from intubation or acute sinusitis. Electronically Signed   By: Van Clines M.D.   On: 08/25/2021 13:42   CT Angio Chest PE W and/or Wo Contrast  Result Date: 08/14/2021 CLINICAL DATA:  Cardiac Arrest; PE suspected, high prob; Patient present in CT on ventilator EXAM: CT ANGIOGRAPHY CHEST WITH CONTRAST TECHNIQUE: Multidetector CT imaging of the chest was performed using the standard protocol during bolus administration of intravenous contrast. Multiplanar CT image reconstructions and MIPs were obtained to evaluate the vascular anatomy. CONTRAST:  44m OMNIPAQUE IOHEXOL 350 MG/ML SOLN COMPARISON:  Current chest radiograph.  Prior chest CT, 06/14/2014. FINDINGS: Cardiovascular: Pulmonary arteries are well opacified. Study is somewhat degraded by motion. Allowing for the respiratory motion degradation, there is no evidence of a pulmonary embolism. Heart is normal in size and configuration. No pericardial effusion. Great vessels normal in caliber. Aorta is not opacified.  Mild descending thoracic aorta atherosclerotic calcifications. Mediastinum/Nodes: Endotracheal tube tip lies 2.8 cm above the carina. Visualized thyroid is unremarkable. No mediastinal or hilar masses or enlarged lymph nodes. Trachea and esophagus are unremarkable. Lungs/Pleura: Near complete consolidation/atelectasis of the lower lobes, with more volume loss noted on the right. Patchy airspace opacities in the upper lobes, greater on the left, ground-glass opacities evident in the right middle lobe. Bilateral interstitial thickening, which is most evident in the upper lobes. No pneumothorax. Upper Abdomen: No acute abnormality. Musculoskeletal: Bilateral anterior rib fractures. On the right, these involve the second, third, fourth, fifth and sixth ribs, and likely the 7. There also more subtle fractures noted on the left, which are mole likely chronic or subacute. Possible subtle sternal fracture, although sternal assessment is limited by significant respiratory motion. No vertebral fractures. Review of the MIP images confirms the above findings. IMPRESSION: 1. No convincing pulmonary embolism. Study degraded by respiratory motion. 2. Lower lobes nearly opacified, likely a significant component, if not completely,  atelectasis. 3. Additional patchy areas airspace opacities in both upper lobes and the right middle lobe, most evident in the left upper lobe, consistent with multifocal pneumonia, possibly due to areas of edema or lung contusion from CPR. 4. Interstitial thickening.  Suspect interstitial edema. 5. Anterior rib fractures, which are most evident on the right with a appear recent/acute. Left-sided rib fractures may be chronic or subacute. Right-sided fractures may be from CPR. 6. Well-positioned endotracheal tube. Aortic Atherosclerosis (ICD10-I70.0). Electronically Signed   By: Lajean Manes M.D.   On: 08/19/2021 17:26   DG Chest Port 1 View  Result Date: 07/30/2021 CLINICAL DATA:  Difficulty breathing  EXAM: PORTABLE CHEST 1 VIEW COMPARISON:  Previous studies including the examination of 08/26/2021 FINDINGS: Transverse diameter of heart is increased. Central pulmonary vessels are less prominent. There is decrease in interstitial and alveolar densities in parahilar regions. Faint residual ground-glass densities seen in the left upper lung fields, possibly residual pulmonary edema or pneumonitis. Small patchy infiltrates in the medial lower lung fields. Costophrenic angles are clear. There is no pneumothorax. Tip of endotracheal tube is 2.3 cm above the carina. Tip of enteric tube is seen in the fundus of the stomach with side port at the gastroesophageal junction. Tip of central venous catheter is seen in superior vena cava. IMPRESSION: Cardiomegaly. There is interval decrease in pulmonary vascular congestion. There is interval decrease in interstitial and alveolar densities in the parahilar regions, more so on the left side suggesting resolving pulmonary edema. Residual ground-glass densities in the left upper lung fields may suggest residual pulmonary edema or pneumonitis. Increased density is seen in medial aspects of both lower lung fields suggesting possible atelectasis/pneumonitis. Electronically Signed   By: Elmer Picker M.D.   On: 07/30/2021 08:08   DG CHEST PORT 1 VIEW  Result Date: 08/21/2021 CLINICAL DATA:  Central line placement. EXAM: PORTABLE CHEST 1 VIEW COMPARISON:  Radiograph and CTA earlier today. FINDINGS: There is a new right internal jugular central venous catheter with tip overlying the SVC. No pneumothorax. The endotracheal tube is been retracted, tip now 5.9 cm from the carina. Decreasing opacity in the right upper lobe from prior exam, however worsening of opacity throughout the left hemithorax. Stable heart size and mediastinal contours. Bibasilar opacity better appreciated on prior CT, without interval radiographic change. Slight improvement in pulmonary edema. IMPRESSION: 1.  New right internal jugular central venous catheter with tip overlying the SVC. No pneumothorax. 2. Endotracheal tube has been retracted, tip now 5.9 cm from the carina. 3. Improving right upper lobe aeration, however worsening opacity throughout the left hemithorax. Bibasilar opacification was better appreciated on CT. 4. Improving pulmonary edema. Electronically Signed   By: Keith Rake M.D.   On: 08/20/2021 19:10   DG Chest Port 1 View  Result Date: 08/19/2021 CLINICAL DATA:  Post CPR EXAM: PORTABLE CHEST 1 VIEW COMPARISON:  September 2015 FINDINGS: Low lung volumes. Pulmonary vascular congestion with edema. No pleural effusion. No pneumothorax. Cardiomediastinal contours are within normal limits. Endotracheal tube is just above the carina and retraction is recommended. IMPRESSION: Endotracheal tube just above carina.  Retraction recommended. Pulmonary vascular congestion with edema. Electronically Signed   By: Macy Mis M.D.   On: 08/19/2021 12:43   EEG adult  Result Date: 08/12/2021 Lora Havens, MD     07/30/2021  3:05 AM Patient Name: Tara [name] MRN: [id number] Epilepsy Attending: Lora Havens Referring Physician/Provider: Eric Form, NP Date: 08/07/2021 Duration: 23.47 mins Patient history: 55yo F with  ams s/p V fib arrest. EEG to evaluate for seizure. Level of alertness:  comatose AEDs during EEG study: propofol Technical aspects: This EEG study was done with scalp electrodes positioned according to the 10-20 International system of electrode placement. Electrical activity was acquired at a sampling rate of _0  and reviewed with a high frequency filter of _1  and a low frequency filter of _2 . EEG data were recorded continuously and digitally stored. Description: EEG showed continuous generalized background suppression. EEG was not reactive to reactive stimuli. Hyperventilation and photic stimulation were not performed.   ABNORMALITY - Background suppression, generalized  IMPRESSION: This study is suggestive of profound diffuse encephalopathy, nonspecific etiology but likely related to sedation, anoxic/hypoxic brain injury. No seizures or epileptiform discharges were seen throughout the recording. Lora Havens   ECHOCARDIOGRAM COMPLETE  Result Date: 08/25/2021    ECHOCARDIOGRAM REPORT   Patient Name:   Tara [name] Date of Exam: 08/18/2021 Medical Rec #:  [id number]    Height:       65.0 in Accession #:    [id number]   Weight:       139.0 lb Date of Birth:  1966-08-18   BSA:          1.695 m Patient Age:    63 years     BP:           113/95 mmHg Patient Gender: F            HR:           86 bpm. Exam Location:  Inpatient Procedure: 2D Echo, Cardiac Doppler and Color Doppler STAT ECHO Indications:    Cardiac arrest  History:        Patient has no prior history of Echocardiogram examinations.                 Risk Factors:Current Smoker. Cardiac arrest out for 20 minutes.                 ETOH abuse. Covid 19 positive.  Sonographer:    Merrie Roof RDCS Referring Phys: [id number] [place]  1. Left ventricular ejection fraction, by estimation, is 25 to 30%. The left ventricle has severely decreased function. The left ventricle demonstrates global hypokinesis. Left ventricular diastolic function could not be evaluated.  2. Right ventricular systolic function is low normal. The right ventricular size is normal.  3. The mitral valve is normal in structure. Trivial mitral valve regurgitation.  4. Aortic valve regurgitation is mild. Mild aortic valve sclerosis is present, with no evidence of aortic valve stenosis. FINDINGS  Left Ventricle: Left ventricular ejection fraction, by estimation, is 25 to 30%. The left ventricle has severely decreased function. The left ventricle demonstrates global hypokinesis. The left ventricular internal cavity size was normal in size. There is no left ventricular hypertrophy. Left ventricular diastolic function could not be evaluated. Right  Ventricle: The right ventricular size is normal. Right vetricular wall thickness was not assessed. Right ventricular systolic function is low normal. Left Atrium: Left atrial size was normal in size. Right Atrium: Right atrial size was normal in size. Pericardium: There is no evidence of pericardial effusion. Mitral Valve: The mitral valve is normal in structure. There is mild thickening of the mitral valve leaflet(s). Trivial mitral valve regurgitation. Tricuspid Valve: The tricuspid valve is normal in structure. Tricuspid valve regurgitation is trivial. Aortic Valve: Aortic valve regurgitation is mild. Mild aortic valve sclerosis is present, with no evidence of aortic valve stenosis. Pulmonic  Valve: The pulmonic valve was not assessed. Aorta: The aortic root is normal in size and structure. IAS/Shunts: No atrial level shunt detected by color flow Doppler.  LEFT VENTRICLE PLAX 2D LVIDd:         3.70 cm LVIDs:         3.10 cm LV PW:         1.00 cm LV IVS:        1.00 cm LVOT diam:     1.90 cm LVOT Area:     2.84 cm  LV Volumes (MOD) LV vol d, MOD A4C: 83.6 ml LV vol s, MOD A4C: 54.0 ml LV SV MOD A4C:     83.6 ml LEFT ATRIUM             Index        RIGHT ATRIUM           Index LA diam:        3.50 cm 2.07 cm/m   RA Area:     10.30 cm LA Vol (A2C):   57.2 ml 33.75 ml/m  RA Volume:   21.10 ml  12.45 ml/m LA Vol (A4C):   42.7 ml 25.20 ml/m LA Biplane Vol: 52.0 ml 30.68 ml/m   AORTA Ao Root diam: 2.80 cm  SHUNTS Systemic Diam: 1.90 cm Dorris Carnes MD Electronically signed by Dorris Carnes MD Signature Date/Time: 08/27/2021/6:31:43 PM    Final     Cardiac Studies   No new data  Patient Profile     55 y.o. female with recent h/o fatigue and weakness who is being seen 08/17/2021 for the evaluation of ventricular fibrillation cardiac arrest with ROSC after ~20 minutes. Lactic acid > 7.0, and ECG without acute ischemic changes. Noted Covid 19 positive in ER..  Assessment & Plan    Ventricular fibrillation  cardiac arrest: No recurrence of malignant arrhythmia off amiodarone. Respiratory failure: Still being ventilated and is acidotic. Anoxic brain injury: Being seen by neuro Acute kidney injury stage 4: Worsening kidney function Elevated troponin I: Troponin pattern is most consistent with demand ischemia/post arrest.  Repeat EKG today. Acute systolic heart failure with global dysfunction: EF estimated to be 30%.  Potential improvement is occurring as blood pressure is not requiring support.  I suspect LV function will spontaneously improve if she gets through this. Thrombocytopenia: Discontinue heparin.   Will follow and help as needed. Not anticipating ischemic evaluation unless dramatic turnaround.  For questions or updates, please contact [place] Please consult [email or web address] for contact info under        Signed, Sinclair Grooms, MD  07/31/2021, 9:52 AM

## 2021-08-01 ENCOUNTER — Inpatient Hospital Stay (HOSPITAL_COMMUNITY): Payer: BC Managed Care – PPO

## 2021-08-01 DIAGNOSIS — I469 Cardiac arrest, cause unspecified: Secondary | ICD-10-CM | POA: Diagnosis not present

## 2021-08-01 DIAGNOSIS — I4901 Ventricular fibrillation: Secondary | ICD-10-CM | POA: Diagnosis not present

## 2021-08-01 DIAGNOSIS — J9601 Acute respiratory failure with hypoxia: Secondary | ICD-10-CM | POA: Diagnosis not present

## 2021-08-01 DIAGNOSIS — I2510 Atherosclerotic heart disease of native coronary artery without angina pectoris: Secondary | ICD-10-CM | POA: Diagnosis not present

## 2021-08-01 DIAGNOSIS — R230 Cyanosis: Secondary | ICD-10-CM

## 2021-08-01 LAB — COMPREHENSIVE METABOLIC PANEL
ALT: 1128 U/L — ABNORMAL HIGH (ref 0–44)
AST: 3326 U/L — ABNORMAL HIGH (ref 15–41)
Albumin: 2.2 g/dL — ABNORMAL LOW (ref 3.5–5.0)
Alkaline Phosphatase: 145 U/L — ABNORMAL HIGH (ref 38–126)
Anion gap: 16 — ABNORMAL HIGH (ref 5–15)
BUN: 36 mg/dL — ABNORMAL HIGH (ref 6–20)
CO2: 22 mmol/L (ref 22–32)
Calcium: 7 mg/dL — ABNORMAL LOW (ref 8.9–10.3)
Chloride: 94 mmol/L — ABNORMAL LOW (ref 98–111)
Creatinine, Ser: 4.22 mg/dL — ABNORMAL HIGH (ref 0.44–1.00)
GFR, Estimated: 12 mL/min — ABNORMAL LOW (ref >60)
Glucose, Bld: 138 mg/dL — ABNORMAL HIGH (ref 70–99)
Potassium: 3.4 mmol/L — ABNORMAL LOW (ref 3.5–5.1)
Sodium: 132 mmol/L — ABNORMAL LOW (ref 135–145)
Total Bilirubin: 3.1 mg/dL — ABNORMAL HIGH (ref 0.3–1.2)
Total Protein: 4.3 g/dL — ABNORMAL LOW (ref 6.5–8.1)

## 2021-08-01 LAB — GLUCOSE, CAPILLARY
Glucose-Capillary: 125 mg/dL — ABNORMAL HIGH (ref 70–99)
Glucose-Capillary: 128 mg/dL — ABNORMAL HIGH (ref 70–99)
Glucose-Capillary: 131 mg/dL — ABNORMAL HIGH (ref 70–99)
Glucose-Capillary: 140 mg/dL — ABNORMAL HIGH (ref 70–99)
Glucose-Capillary: 141 mg/dL — ABNORMAL HIGH (ref 70–99)
Glucose-Capillary: 142 mg/dL — ABNORMAL HIGH (ref 70–99)
Glucose-Capillary: 148 mg/dL — ABNORMAL HIGH (ref 70–99)

## 2021-08-01 LAB — CBC
HCT: 27.6 % — ABNORMAL LOW (ref 36.0–46.0)
Hemoglobin: 9.6 g/dL — ABNORMAL LOW (ref 12.0–15.0)
MCH: 34.2 pg — ABNORMAL HIGH (ref 26.0–34.0)
MCHC: 34.8 g/dL (ref 30.0–36.0)
MCV: 98.2 fL (ref 80.0–100.0)
Platelets: 75 10*3/uL — ABNORMAL LOW (ref 150–400)
RBC: 2.81 MIL/uL — ABNORMAL LOW (ref 3.87–5.11)
RDW: 11.7 % (ref 11.5–15.5)
WBC: 3.2 10*3/uL — ABNORMAL LOW (ref 4.0–10.5)
nRBC: 1.9 % — ABNORMAL HIGH (ref 0.0–0.2)

## 2021-08-01 LAB — POCT I-STAT 7, (LYTES, BLD GAS, ICA,H+H)
Acid-Base Excess: 0 mmol/L (ref 0.0–2.0)
Bicarbonate: 24.6 mmol/L (ref 20.0–28.0)
Calcium, Ion: 0.93 mmol/L — ABNORMAL LOW (ref 1.15–1.40)
HCT: 29 % — ABNORMAL LOW (ref 36.0–46.0)
Hemoglobin: 9.9 g/dL — ABNORMAL LOW (ref 12.0–15.0)
O2 Saturation: 95 %
Patient temperature: 37.4
Potassium: 3.4 mmol/L — ABNORMAL LOW (ref 3.5–5.1)
Sodium: 131 mmol/L — ABNORMAL LOW (ref 135–145)
TCO2: 26 mmol/L (ref 22–32)
pCO2 arterial: 37.8 mmHg (ref 32.0–48.0)
pH, Arterial: 7.422 (ref 7.350–7.450)
pO2, Arterial: 75 mmHg — ABNORMAL LOW (ref 83.0–108.0)

## 2021-08-01 LAB — CK: Total CK: 1684 U/L — ABNORMAL HIGH (ref 38–234)

## 2021-08-01 LAB — PHOSPHORUS: Phosphorus: 3.6 mg/dL (ref 2.5–4.6)

## 2021-08-01 IMAGING — DX DG CHEST 1V PORT
1 series · 1 of 1 positions shown · non-contrast
Comparison: [DATE]

CLINICAL DATA: Respiratory failure

EXAM:
PORTABLE CHEST 1 VIEW

[chest]
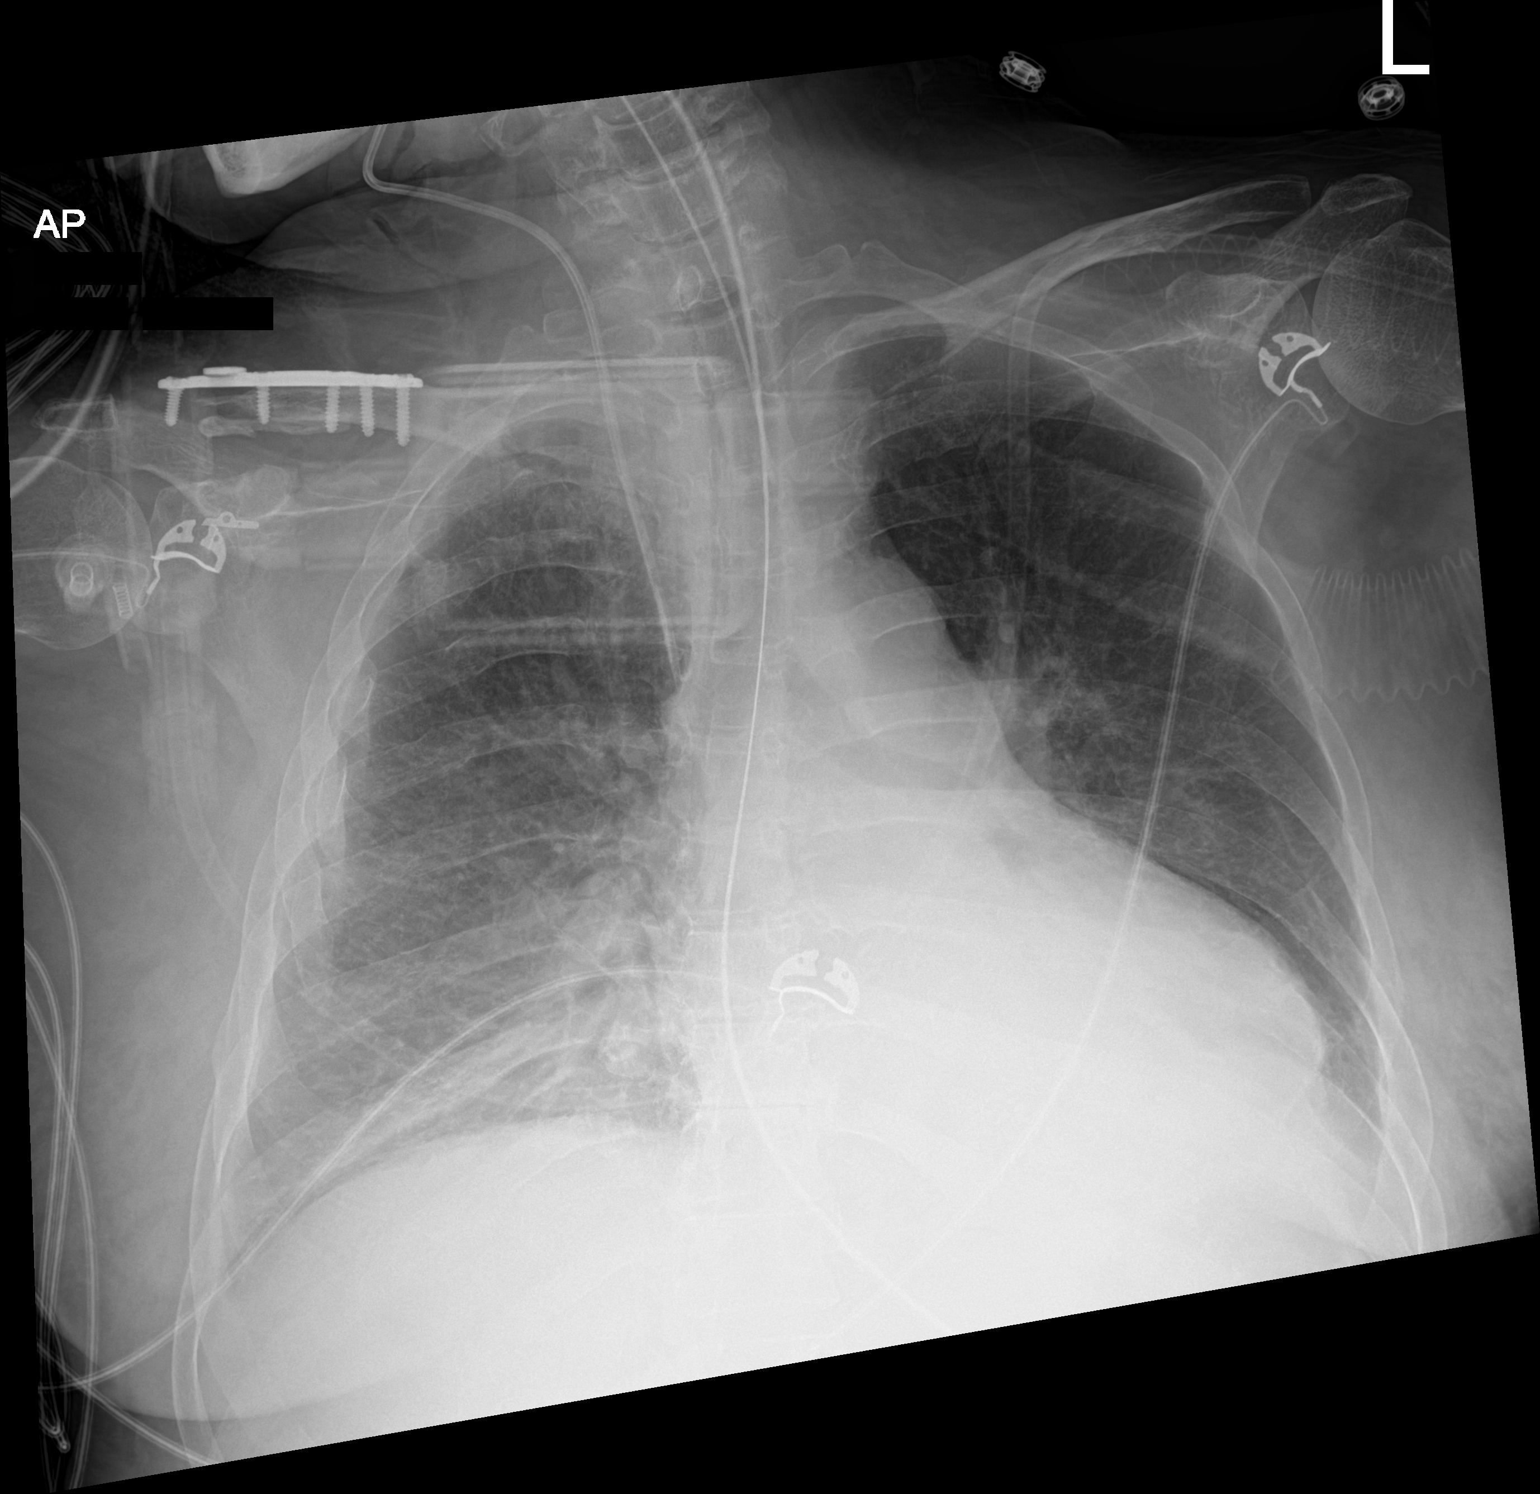

[1 of 1 positions shown; findings below may reference images not displayed]

FINDINGS: Endotracheal tube 2 cm above the carina unchanged. Right jugular
central venous catheter in the proximal SVC unchanged. NG tube
enters the stomach.

Progression of bibasilar airspace disease, left greater than right.
Small pleural effusions. Pulmonary vascular congestion with mild
progression. No pneumothorax.
IMPRESSION: Pulmonary vascular congestion with bilateral airspace disease with
disease which may be edema or pneumonia.

Progressive bibasilar atelectasis/infiltrate.

## 2021-08-01 IMAGING — MR MR CERVICAL SPINE W/O CM
4 of 5 series · 19 of 48 positions shown · non-contrast
Comparison: None.

CLINICAL DATA: Left arm weakness.  Cardiac arrest 3 days prior

EXAM:
MRI CERVICAL SPINE WITHOUT CONTRAST
TECHNIQUE: Multiplanar, multisequence MR imaging of the cervical spine was
performed. No intravenous contrast was administered.

[Series 12: T2 · sagittal · 3.0mm · 0.43mm/px · 6 of 15 slices shown (1 of 2)]
[im 1/15]
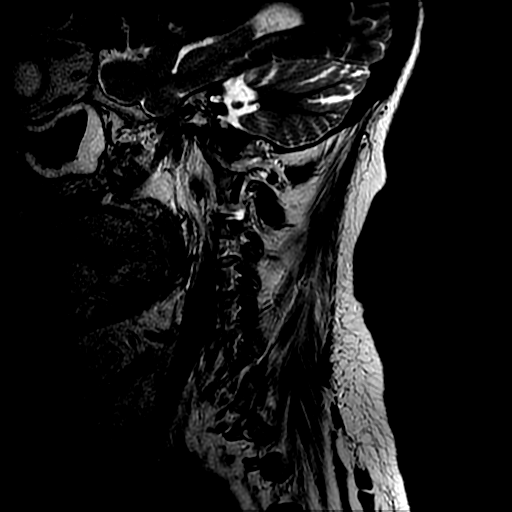
[im 3/15]
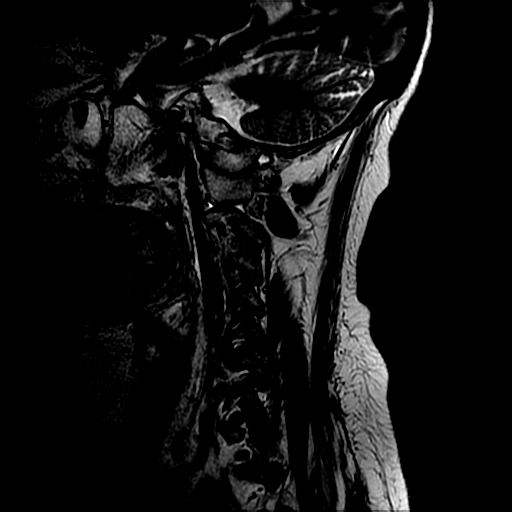
[im 6/15]
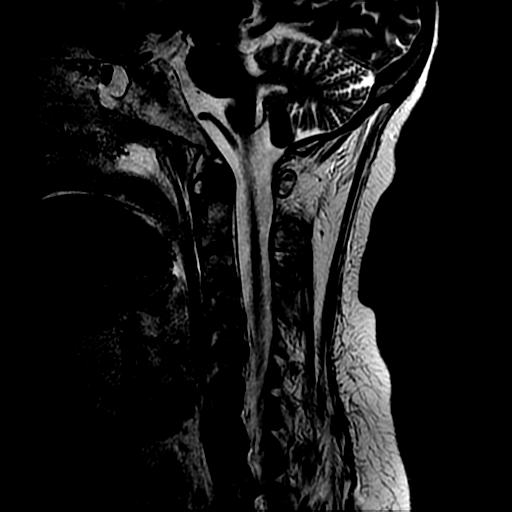
[im 9/15]
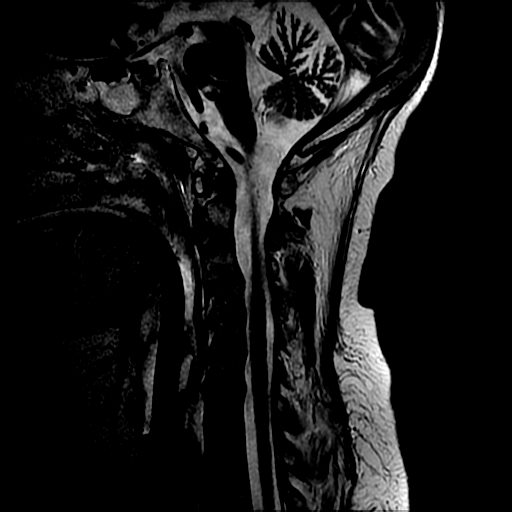
[im 12/15]
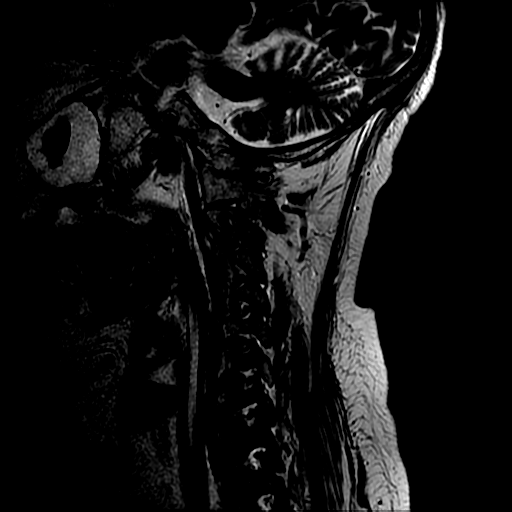
[im 15/15]
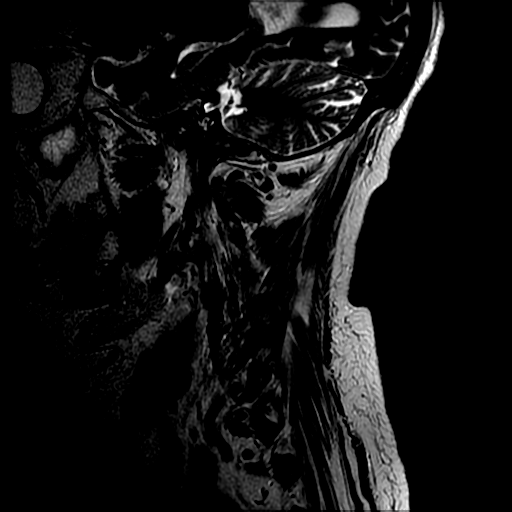

[Series 13: T1 · sagittal · 3.0mm · 0.43mm/px · 3 of 15 slices shown]
[im 3/15]
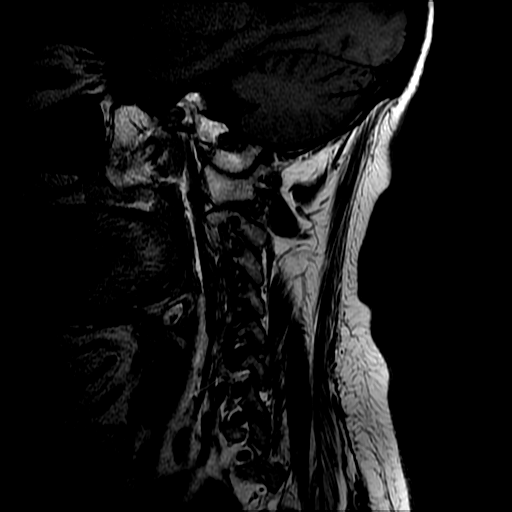
[im 8/15]
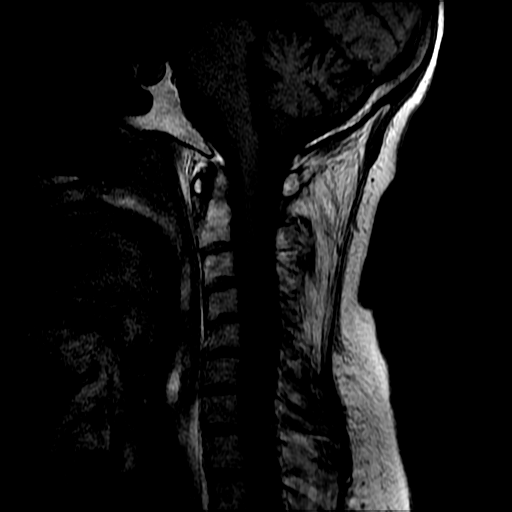
[im 12/15]
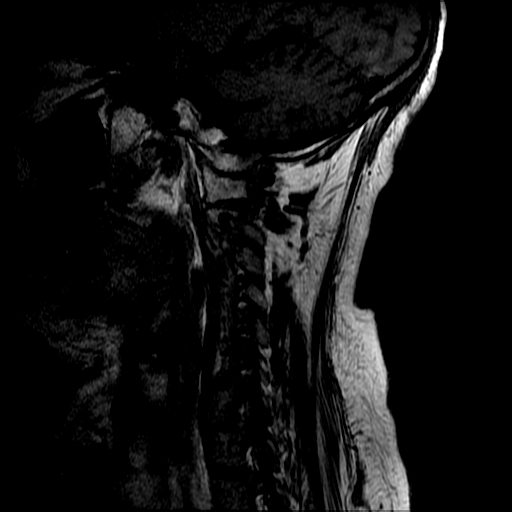

[Series 14: sag ir · sagittal · 3.0mm · 0.43mm/px · 3 of 15 slices shown]
[im 3/15]
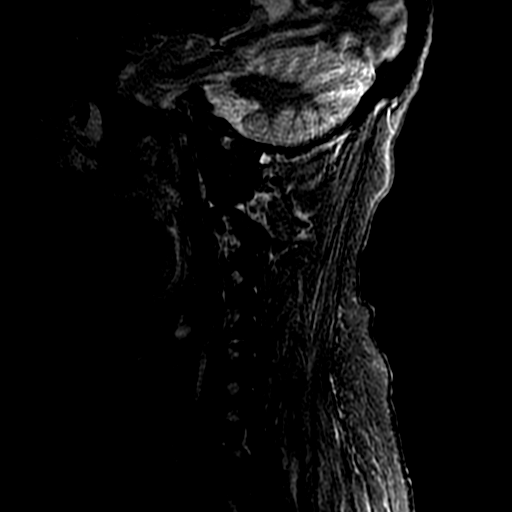
[im 8/15]
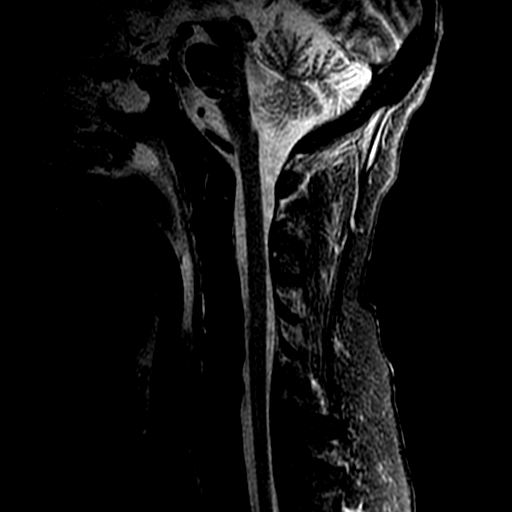
[im 12/15]
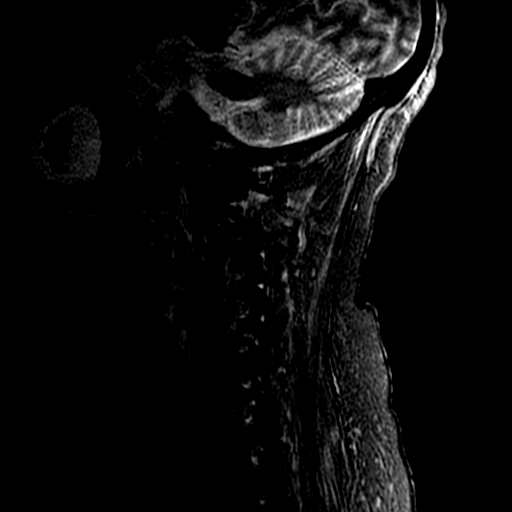

[Series 16: T2 · axial · 3.0mm · 0.39mm/px · z∈[-205,-123]mm · 7 of 30 slices shown (2 of 2)]
[im 1/30]
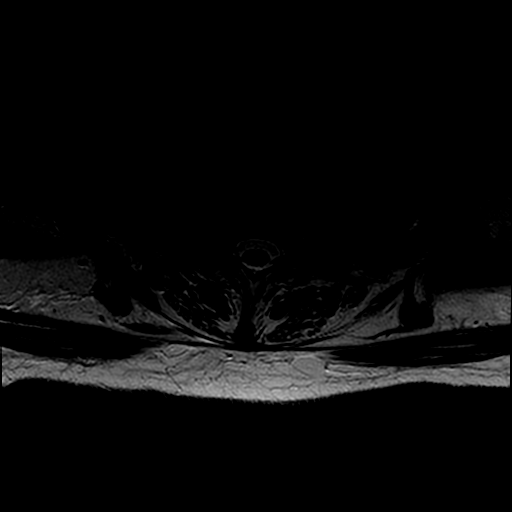
[im 5/30]
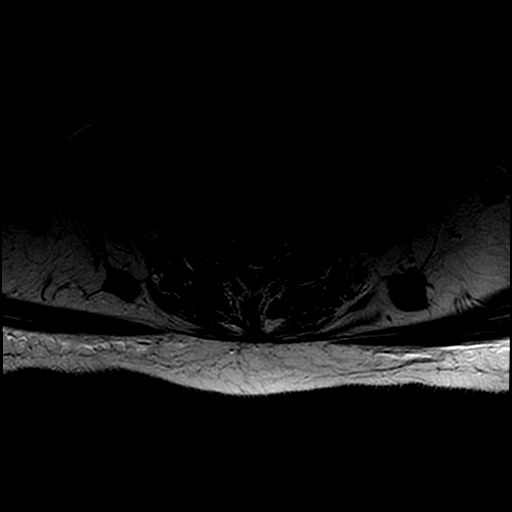
[im 9/30]
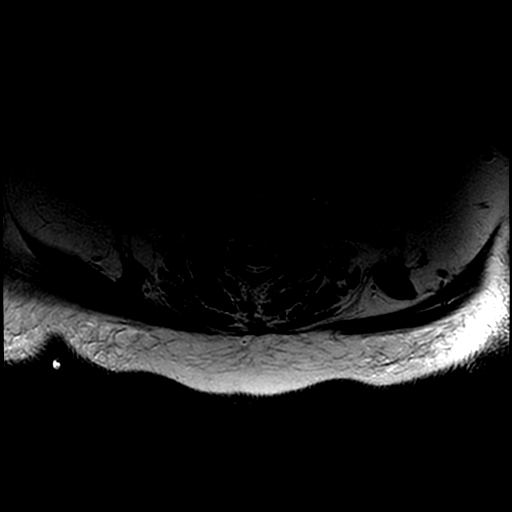
[im 14/30]
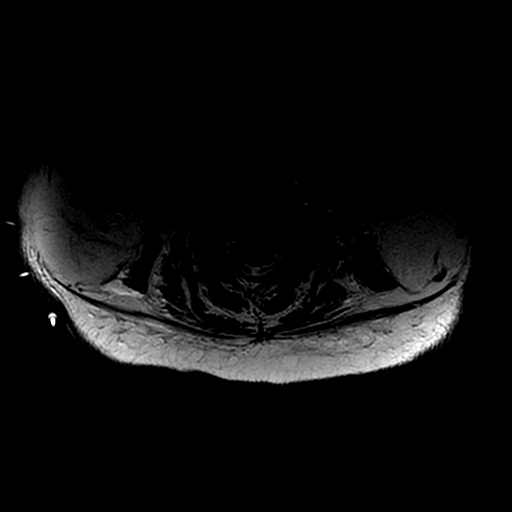
[im 16/30]
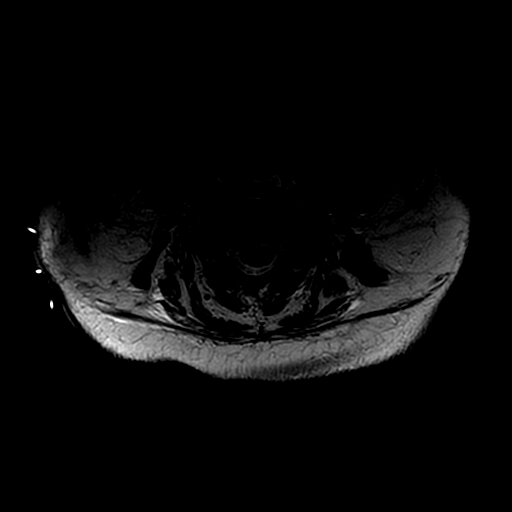
[im 21/30]
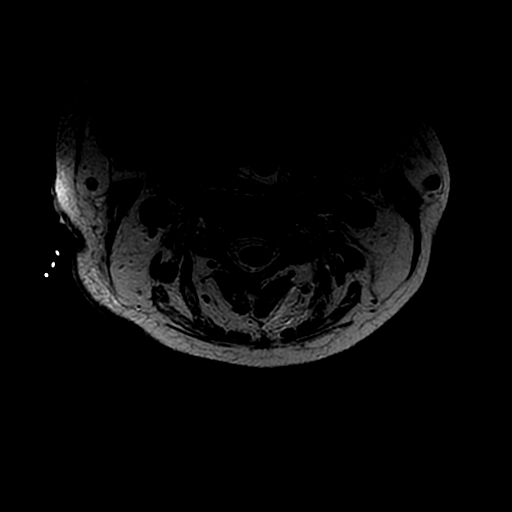
[im 25/30]
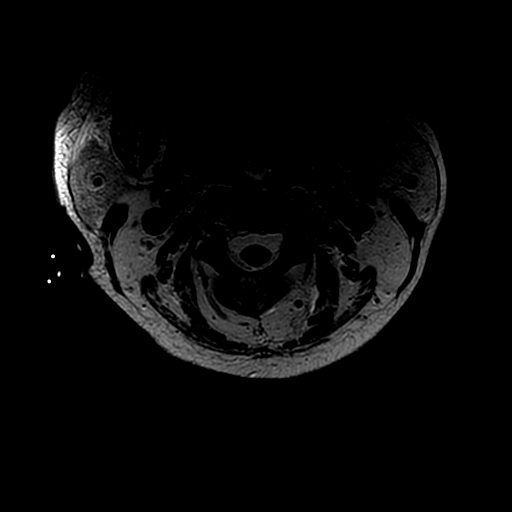

[19 of 48 positions shown; findings below may reference images not displayed]

FINDINGS: Alignment: Normal alignment.  Straightening of the cervical lordosis

Vertebrae: Negative for fracture or mass

Cord: Normal signal and morphology.

Posterior Fossa, vertebral arteries, paraspinal tissues: Patient is
intubated. Mucosal edema throughout the paranasal sinuses. Air-fluid
levels in the maxillary sinus bilaterally. Negative for soft tissue
mass in the neck.

Disc levels:

C2-3: Negative

C3-4: Mild uncinate spurring on the left with mild left foraminal
narrowing

C4-5: Negative

C5-6: Mild disc and mild facet degeneration. Mild foraminal
narrowing bilaterally

C6-7: No significant stenosis.  Mild disc degeneration.

C7-T1: Negative
IMPRESSION: Negative for cord compression or cord infarct.

Mild cervical spine degenerative change

Sinusitis with air-fluid levels.

## 2021-08-01 IMAGING — MR MR HEAD W/O CM
9 of 10 series · 38 of 48 positions shown · non-contrast
Comparison: CT head [DATE]

CLINICAL DATA: Cardiac arrest three days ago.  Abnormal neuro exam

EXAM:
MRI HEAD WITHOUT CONTRAST
TECHNIQUE: Multiplanar, multiecho pulse sequences of the brain and surrounding
structures were obtained without intravenous contrast.

[Series 3: DWI · axial · 3.0mm · 1.09mm/px · z∈[-113,+38]mm · 11 of 108 slices shown (1 of 4)]
[im 1/108]
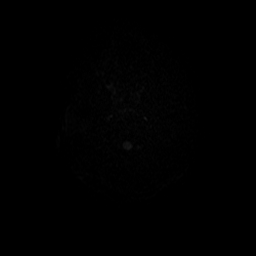
[im 11/108]
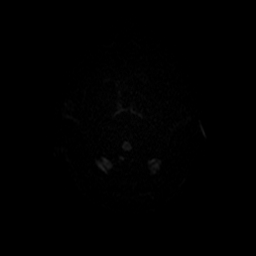
[im 22/108]
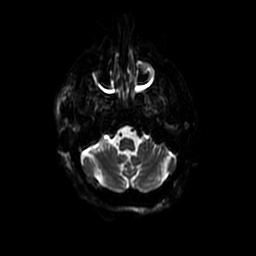
[im 33/108]
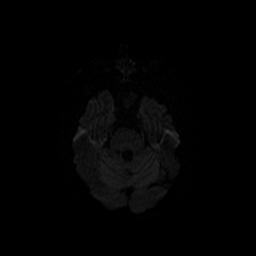
[im 43/108]
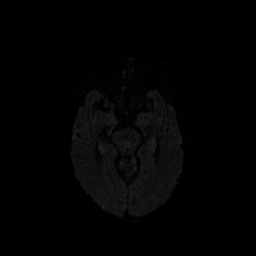
[im 54/108]
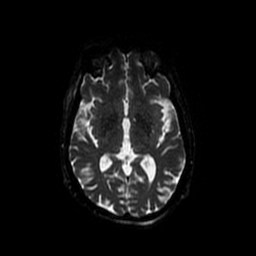
[im 65/108]
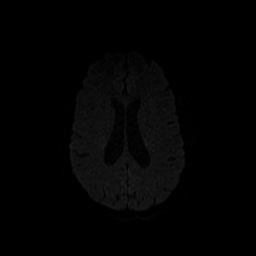
[im 75/108]
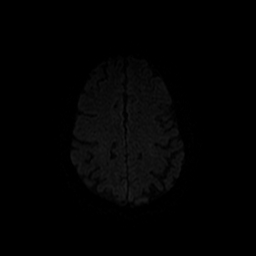
[im 86/108]
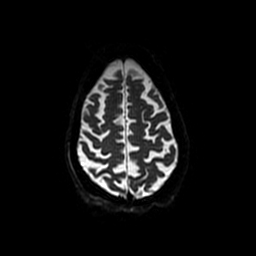
[im 97/108]
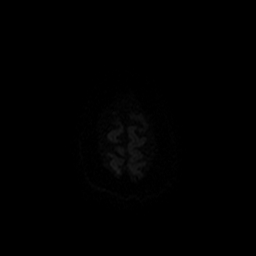
[im 108/108]
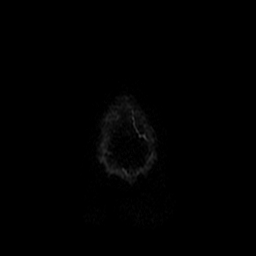

[Series 4: DWI · coronal · 5.0mm · 1.09mm/px · 7 of 80 slices shown (2 of 4)]
[im 1/80]
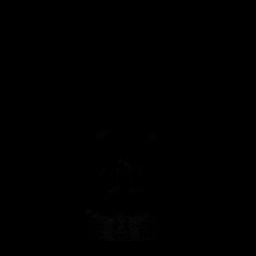
[im 14/80]
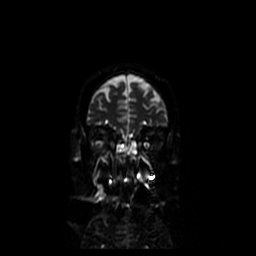
[im 27/80]
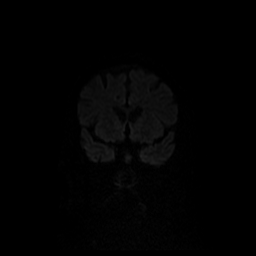
[im 40/80]
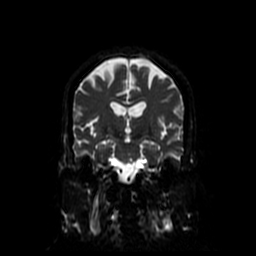
[im 53/80]
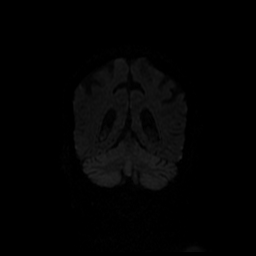
[im 66/80]
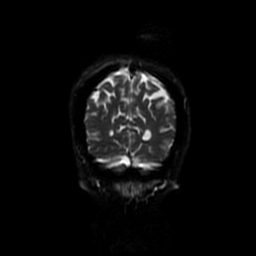
[im 80/80]
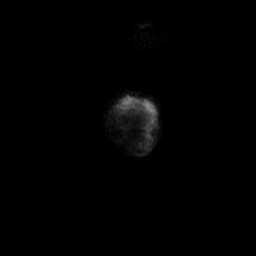

[Series 5: T1 · sagittal · 5.0mm · 0.47mm/px · 2 of 23 slices shown (1 of 2)]
[im 1/23]
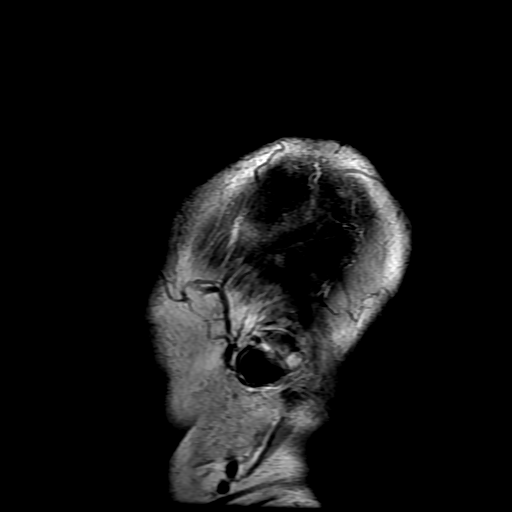
[im 23/23]
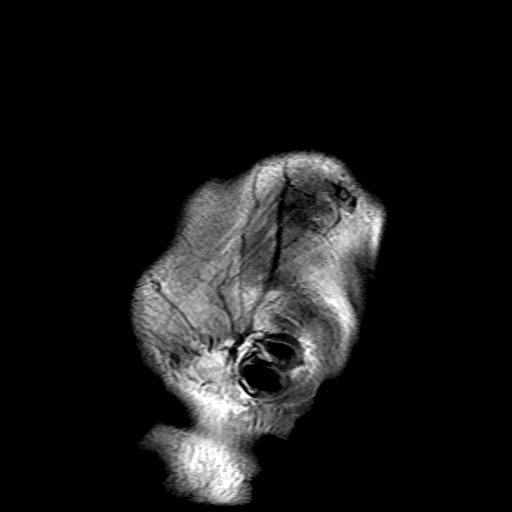

[Series 6: T2 · axial · 5.0mm · 0.43mm/px · z∈[-103,+46]mm · 2 of 27 slices shown (1 of 2)]
[im 1/27]
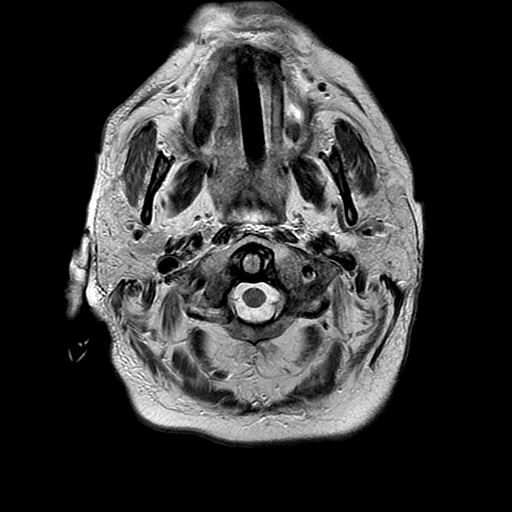
[im 27/27]
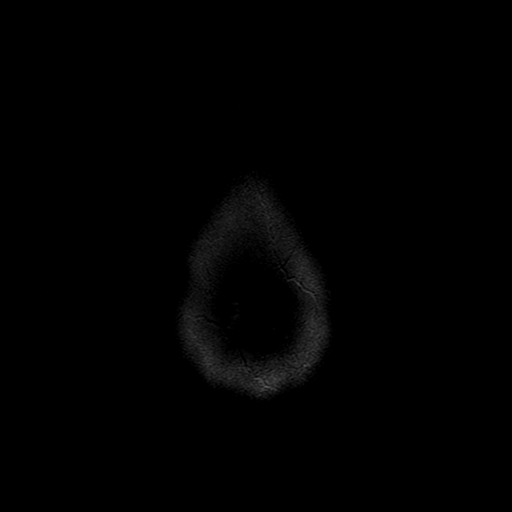

[Series 7: FLAIR · axial · 3.0mm · 0.43mm/px · z∈[-103,+46]mm · 2 of 27 slices shown]
[im 1/27]
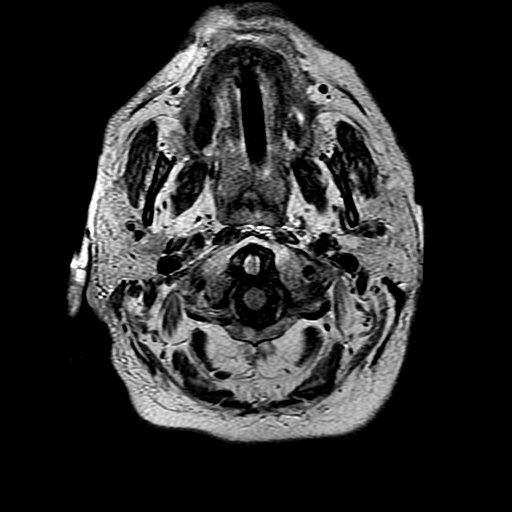
[im 27/27]
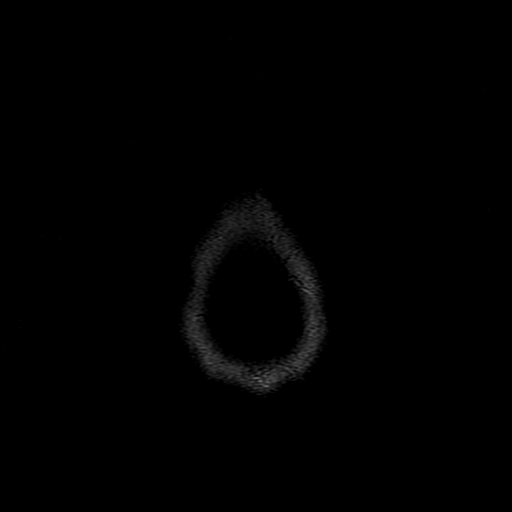

[Series 9: T1 · axial · 3.0mm · 0.47mm/px · z∈[-107,-92]mm · 2 of 108 slices shown (2 of 2)]
[im 1/108]
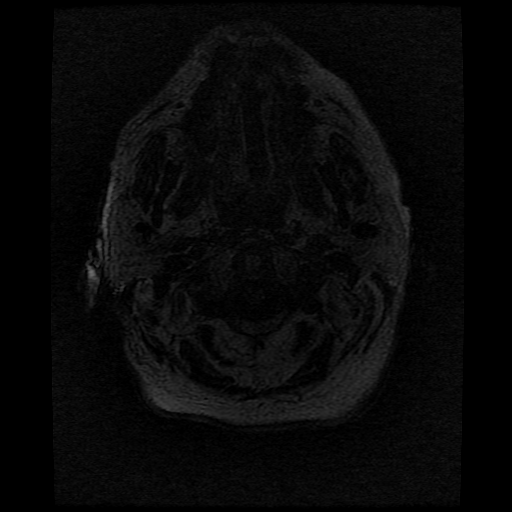
[im 12/108]
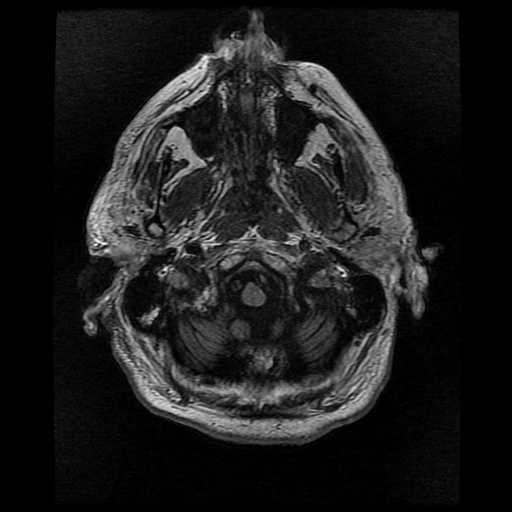

[Series 10: T2 · coronal · 5.0mm · 0.39mm/px · 3 of 31 slices shown (2 of 2)]
[im 1/31]
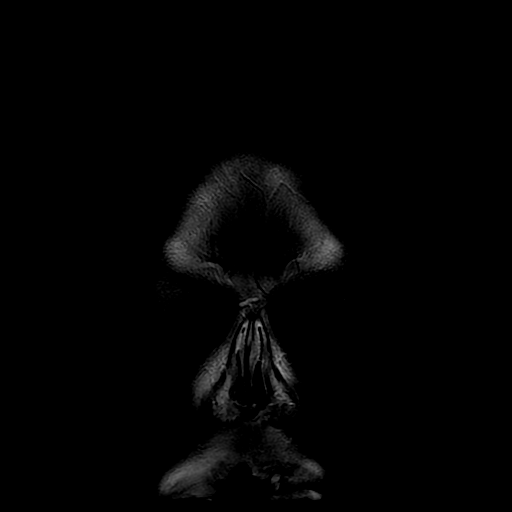
[im 16/31]
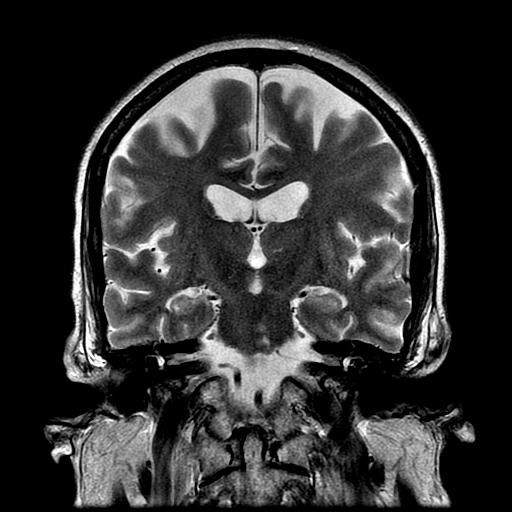
[im 31/31]
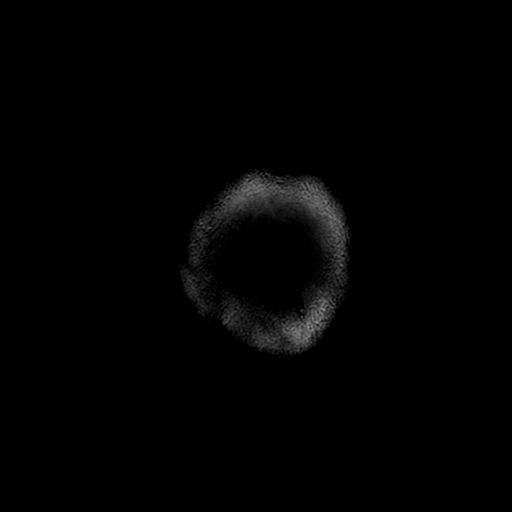

[Series 300: DWI · axial · 3.0mm · 1.09mm/px · z∈[-113,+38]mm · 5 of 54 slices shown (3 of 4)]
[im 1/54]
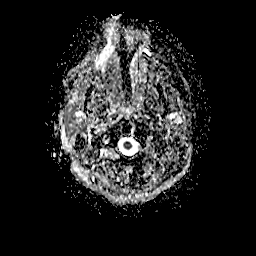
[im 14/54]
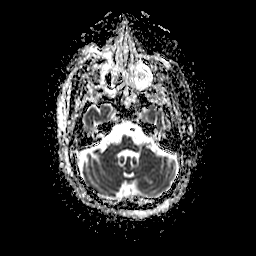
[im 27/54]
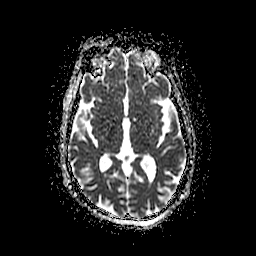
[im 40/54]
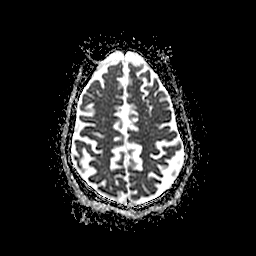
[im 54/54]
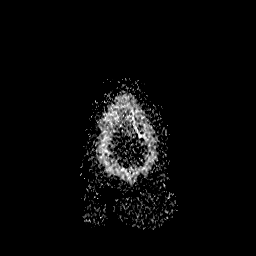

[Series 400: DWI · coronal · 5.0mm · 1.09mm/px · 4 of 40 slices shown (4 of 4)]
[im 1/40]
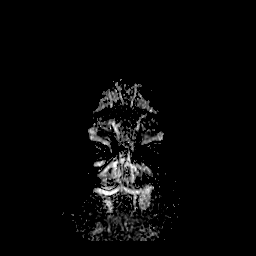
[im 14/40]
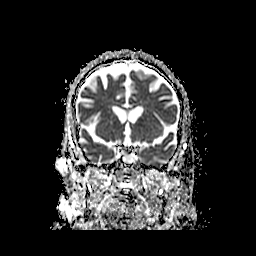
[im 27/40]
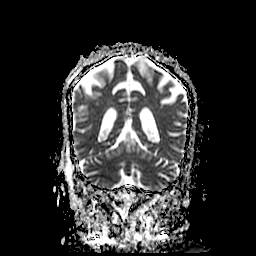
[im 40/40]
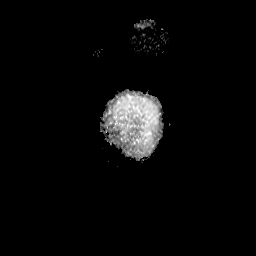

[38 of 48 positions shown; findings below may reference images not displayed]

FINDINGS: Brain: 3 mm focus of restricted diffusion right occipital cortex.
Restricted diffusion in the body and tail hippocampus bilaterally.
These appear to be related to recent ischemia.

Ventricle size normal. Small chronic infarct right parietal lobe.
Chronic lacunar infarction in the periventricular white matter on
the right. Mild chronic ischemia in the left pons. Small chronic
infarct left cerebellum. Negative for hemorrhage or mass.

Vascular: Normal arterial flow voids in the skull base.

Skull and upper cervical spine: Negative

Sinuses/Orbits: Extensive mucosal edema in the paranasal sinuses.
Air-fluid levels in the maxillary sinus bilaterally. Patient
intubated. Bilateral mastoid effusion. Negative orbit

Other: None
IMPRESSION: Small area of acute infarct right occipital cortex. Restricted
diffusion in the upper campus bilaterally compatible with recent
ischemia

Mild chronic ischemic change.

Mucosal edema in the paranasal sinuses with air-fluid levels.

## 2021-08-01 IMAGING — DX DG ABD PORTABLE 1V
1 series · 1 of 1 positions shown · non-contrast
Comparison: Abdominal radiograph [DATE]

CLINICAL DATA: Feeding tube placement

EXAM:
PORTABLE ABDOMEN - 1 VIEW

[abdomen supine]
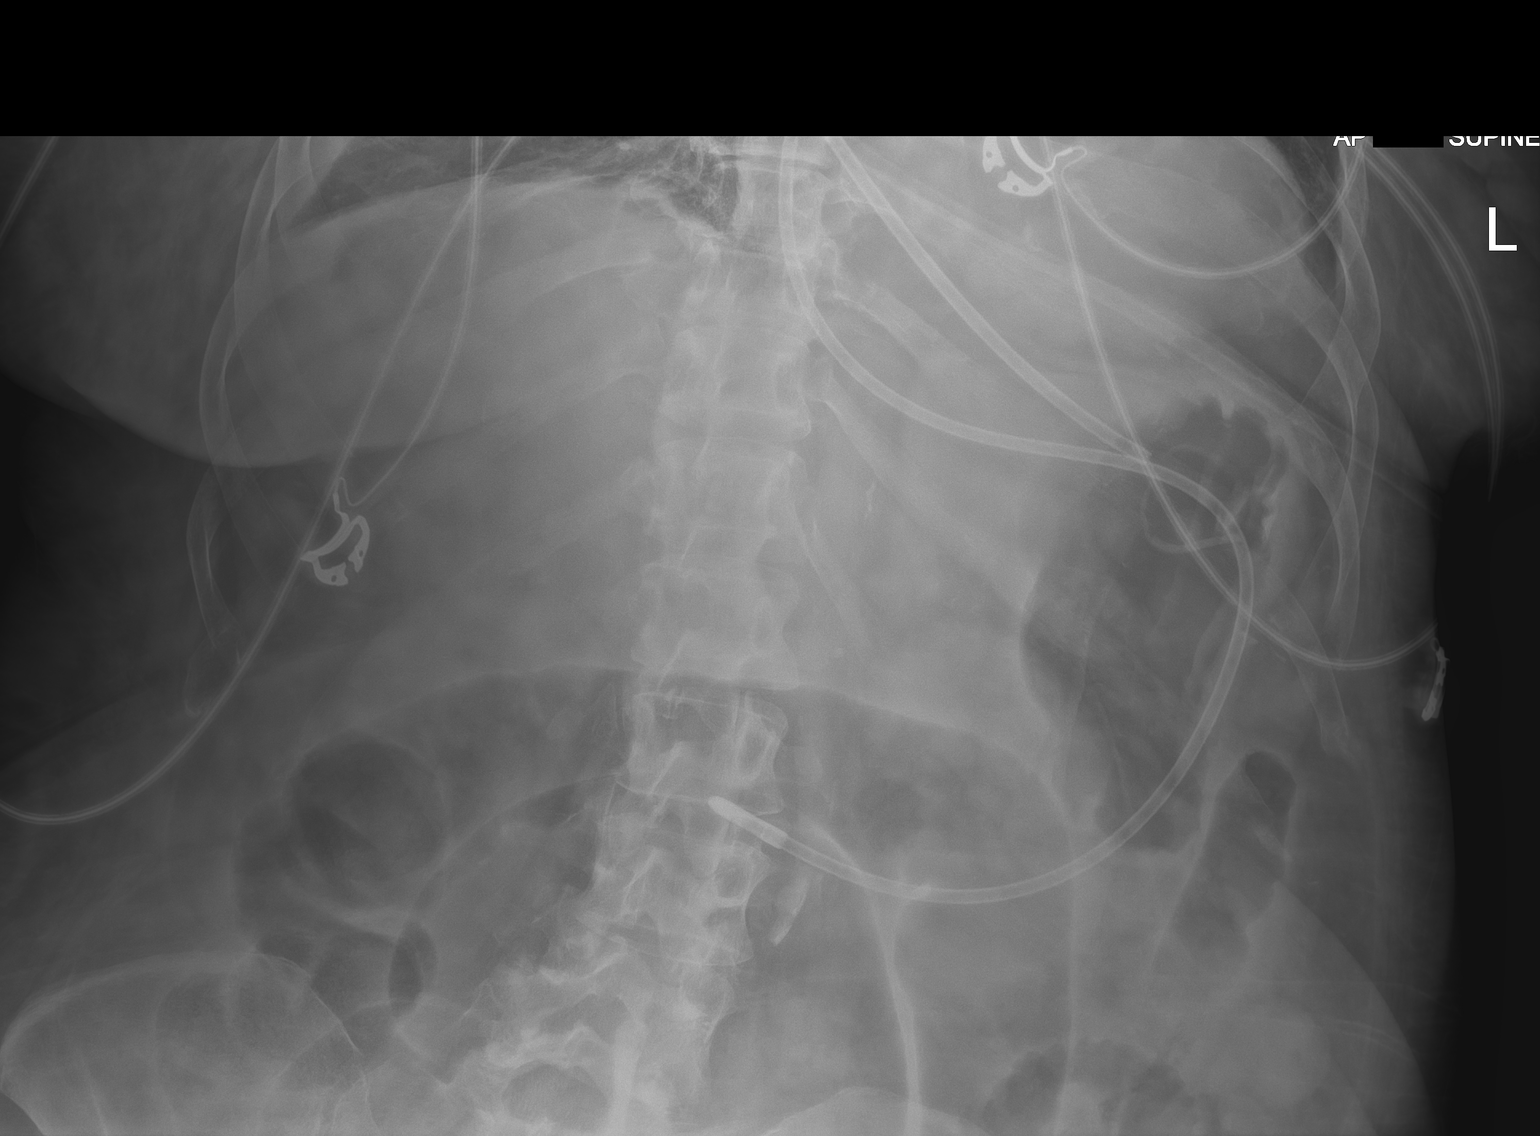

[1 of 1 positions shown; findings below may reference images not displayed]

FINDINGS: A feeding tube is present with tip projecting over the expected
location of the stomach body, oriented towards the antrum.

There is a loop of bowel just below the transverse colon which could
also represent colon or dilated small bowel.

Indistinct linear opacities at the right lung base along with trace
right lateral pleural fluid. Retrocardiac airspace opacity on the
left. Bilateral lower rib fractures.
IMPRESSION: 1. Feeding tube tip: Stomach body, oriented towards the antrum.
2. Lower rib fractures bilaterally with continued airspace opacity
in the lower lobes and trace right pleural fluid.
3. I cannot exclude a dilated loop of small bowel in the central
abdomen, although the identified loop is only partially included and
could represent nondilated colon.

## 2021-08-01 MED ORDER — SODIUM CHLORIDE 0.9 % IV SOLN
3.0000 g | Freq: Two times a day (BID) | INTRAVENOUS | Status: DC
  Administered 2021-08-01 – 2021-08-02 (×2): 3 g via INTRAVENOUS
  Filled 2021-08-01 (×2): qty 8

## 2021-08-01 MED ORDER — BISACODYL 10 MG RE SUPP
10.0000 mg | Freq: Once | RECTAL | Status: AC
  Administered 2021-08-01: 10 mg via RECTAL
  Filled 2021-08-01: qty 1

## 2021-08-01 MED ORDER — CALCIUM GLUCONATE-NACL 1-0.675 GM/50ML-% IV SOLN
1.0000 g | Freq: Once | INTRAVENOUS | Status: AC
  Administered 2021-08-01: 1000 mg via INTRAVENOUS
  Filled 2021-08-01: qty 50

## 2021-08-01 MED ORDER — VITAL 1.5 CAL PO LIQD
1000.0000 mL | ORAL | Status: DC
  Administered 2021-08-01 – 2021-08-02 (×2): 1000 mL

## 2021-08-01 MED ORDER — NOREPINEPHRINE 4 MG/250ML-% IV SOLN
0.0000 ug/min | INTRAVENOUS | Status: DC
  Administered 2021-08-01: 2 ug/min via INTRAVENOUS
  Administered 2021-08-02: 4 ug/min via INTRAVENOUS
  Administered 2021-08-02 – 2021-08-04 (×2): 2 ug/min via INTRAVENOUS
  Filled 2021-08-01 (×3): qty 250

## 2021-08-01 MED ORDER — POTASSIUM CHLORIDE 10 MEQ/50ML IV SOLN
10.0000 meq | INTRAVENOUS | Status: AC
  Administered 2021-08-01 (×2): 10 meq via INTRAVENOUS
  Filled 2021-08-01 (×2): qty 50

## 2021-08-01 MED ORDER — FUROSEMIDE 10 MG/ML IJ SOLN
80.0000 mg | Freq: Once | INTRAMUSCULAR | Status: AC
  Administered 2021-08-01: 80 mg via INTRAVENOUS
  Filled 2021-08-01: qty 8

## 2021-08-01 MED ORDER — METOCLOPRAMIDE HCL 5 MG/ML IJ SOLN
10.0000 mg | Freq: Four times a day (QID) | INTRAMUSCULAR | Status: DC
Start: 2021-08-01 — End: 2021-08-02
  Administered 2021-08-01 – 2021-08-02 (×3): 10 mg via INTRAVENOUS
  Filled 2021-08-01 (×3): qty 2

## 2021-08-01 MED ORDER — PANTOPRAZOLE SODIUM 40 MG IV SOLR
40.0000 mg | INTRAVENOUS | Status: DC
  Administered 2021-08-02: 40 mg via INTRAVENOUS
  Filled 2021-08-01: qty 40

## 2021-08-01 MED ORDER — POTASSIUM CHLORIDE 20 MEQ PO PACK
40.0000 meq | PACK | Freq: Once | ORAL | Status: AC
  Administered 2021-08-01: 40 meq
  Filled 2021-08-01: qty 2

## 2021-08-01 NOTE — Consult Note (Signed)
Neurology Consultation  Reason for Consult: MRI brain with acute right occipital infarction  Referring Physician: Dr. Tacy Learn  CC: Patient is unable to provide a chief complaint due to condition. She is intubated, not on sedation in the ICU  History is obtained from: Chart review, unable to obtain from patient due to patient's condition, no family at bedside.  HPI: Tara [name] is a 55 y.o. female with a medial history significant for essential hypertension, tobacco dependence, and anxiety who presented to the ED after an episode of unresponsiveness at home. Patient's husband stats they had just returned from a doctor's appointment when he spoke with the plumber for approximately 5 minutes and when he came back inside, he saw that his wife was unresponsive while sitting in a chair and noticed that she was not breathing so he called 9-1-1 and initiated CPR. On EMS arrival, AED indicated patient was in ventricular fibrillation and was shocked twice. She remained in VF and received 1 mg of Epi and a third shock prior to converting back to sinus rhythm with approximately 20 minutes of CPR time. Initial work up in the ED revealed patient that was COVID positive, hyponatremia at 130, creatinine of 1.52, alk phos 148, AST 290, ALT 132, , lactate of 7.2, and troponin of 48. CT head was obtained without acute stroke or evidence of anoxic damage. Due to persistent encephalopathy, an MRI brain and cervical spine were obtained with evidence of a small acute infarction in the right occipital cortex with restricted diffusion in the hippocampus bilaterally compatible with recent ischemia and neurology was consulted for further evaluation.   Of note, patient was recently seen by Dr. Krista Blue with [facility] for evaluation of gradual onset of gait abnormality with sudden worsening since September of 2022, change of her walking posture, left lower extremity weakness with complaints of "giving out" during  ambulation without falls, intermittent bilateral toe numbness, lower extremity- and at times, upper extremity- intermittent shaking, a wide-based, stiff, unsteady gait with a cane and hyperreflexia with nonsustained ankle clonus with recommendations for MRI cervical spine to rule out cervical pathology.   Modified Rankin Scale: 1-No significant post stroke disability and can perform usual duties with stroke symptoms  ROS: Unable to obtain due to altered mental status.   Past Medical History:  Diagnosis Date   Anxiety state, unspecified    Clavicle fracture    right   Depression    Essential hypertension, benign    History of pneumonia    Past Surgical History:  Procedure Laterality Date   BREAST SURGERY Left    lumpectomy   EYE SURGERY     left   ORIF CLAVICULAR FRACTURE Right 03/05/2016   Procedure: Right open reduction internal fixation clavical fracture ;  Surgeon: Tania Ade, MD;  Location: [place];  Service: Orthopedics;  Laterality: Right;   Family History  Problem Relation Age of Onset   Breast cancer Mother    Diabetes Mother        pre-diabetes   Hypertension Mother    Prostate cancer Father    Hypertension Father    Ovarian cancer Maternal Grandmother    Stroke Maternal Grandfather    Breast cancer Paternal Grandmother    Colon cancer Paternal Grandfather    Stomach cancer Cousin    Social History:   reports that she has been smoking cigarettes. She has a 20.00 pack-year smoking history. She has never used smokeless tobacco. She reports current alcohol use of about  7.0 standard drinks per week. She reports that she does not use drugs.  Medications  Current Facility-Administered Medications:    0.9 %  sodium chloride infusion, 250 mL, Intravenous, Continuous, Elsworth Soho, Leanna Sato, MD   Ampicillin-Sulbactam (UNASYN) 3 g in sodium chloride 0.9 % 100 mL IVPB, 3 g, Intravenous, Q12H, Chand, Sudham, MD   bisacodyl (DULCOLAX) suppository 10 mg, 10 mg,  Rectal, Once, Jacky Kindle, MD   chlorhexidine gluconate (MEDLINE KIT) (PERIDEX) 0.12 % solution 15 mL, 15 mL, Mouth Rinse, BID, Kara Mead V, MD, 15 mL at 08/01/21 1025   Chlorhexidine Gluconate Cloth 2 % PADS 6 each, 6 each, Topical, Daily, Rigoberto Noel, MD, 6 each at 07/31/21 2209   dexamethasone (DECADRON) tablet 6 mg, 6 mg, Per Tube, Daily, Rigoberto Noel, MD, 6 mg at 08/01/21 0859   docusate (COLACE) 50 MG/5ML liquid 100 mg, 100 mg, Per Tube, BID, Blanchie Dessert, MD, 100 mg at 08/01/21 0851   docusate (COLACE) 50 MG/5ML liquid 100 mg, 100 mg, Per Tube, BID PRN, Henri Medal, RPH   feeding supplement (PROSource TF) liquid 45 mL, 45 mL, Per Tube, BID, Rigoberto Noel, MD, 45 mL at 08/01/21 0852   feeding supplement (VITAL 1.5 CAL) liquid 1,000 mL, 1,000 mL, Per Tube, Continuous, Rigoberto Noel, MD, Stopped at 08/01/21 1100   fentaNYL (SUBLIMAZE) injection 50-200 mcg, 50-200 mcg, Intravenous, Q30 min PRN, Reome, Earle J, RPH, 100 mcg at 08/26/2021 1752   heparin injection 5,000 Units, 5,000 Units, Subcutaneous, Q12H, Rigoberto Noel, MD, 5,000 Units at 08/01/21 1024   insulin aspart (novoLOG) injection 0-6 Units, 0-6 Units, Subcutaneous, Q4H, Rigoberto Noel, MD, 2 Units at 07/30/21 1148   levalbuterol (XOPENEX) nebulizer solution 0.63 mg, 0.63 mg, Nebulization, Q6H PRN, Magdalen Spatz, NP   MEDLINE mouth rinse, 15 mL, Mouth Rinse, 10 times per day, Rigoberto Noel, MD, 15 mL at 08/01/21 1237   metoCLOPramide (REGLAN) injection 10 mg, 10 mg, Intravenous, Q6H, Chand, Sudham, MD   ondansetron (ZOFRAN) injection 4 mg, 4 mg, Intravenous, Q6H PRN, Bowser, Laurel Dimmer, NP   [START ON 08/02/2021] pantoprazole (PROTONIX) injection 40 mg, 40 mg, Intravenous, Q24H, Henri Medal, RPH   polyethylene glycol (MIRALAX / GLYCOLAX) packet 17 g, 17 g, Per Tube, Daily, Maryan Rued, Whitney, MD, 17 g at 08/01/21 0852   polyethylene glycol (MIRALAX / GLYCOLAX) packet 17 g, 17 g, Per Tube, Daily PRN, Rigoberto Noel, MD    potassium chloride 10 mEq in 50 mL *CENTRAL LINE* IVPB, 10 mEq, Intravenous, Q1 Hr x 2, Estill Cotta, NP, Last Rate: 50 mL/hr at 08/01/21 1351, 10 mEq at 08/01/21 1351   sodium chloride flush (NS) 0.9 % injection 10-40 mL, 10-40 mL, Intracatheter, Q12H, Kara Mead V, MD, 10 mL at 08/01/21 1026   sodium chloride flush (NS) 0.9 % injection 10-40 mL, 10-40 mL, Intracatheter, PRN, Rigoberto Noel, MD, 10 mL at 07/30/21 0917  Exam: Current vital signs: BP 111/71 (BP Location: Right Leg)   Pulse 75   Temp 99.3 F (37.4 C) (Bladder)   Resp (!) 26   Ht _0  (1.651 m)   Wt 72.2 kg   LMP 08/26/2011   SpO2 100%   BMI 26.49 kg/m  Vital signs in last 24 hours: Temp:  [99.3 F (37.4 C)-99.9 F (37.7 C)] 99.3 F (37.4 C) (11/04 0752) Pulse Rate:  [64-81] 75 (11/04 1139) Resp:  [19-26] 26 (11/04 1139) BP: (111-131)/(60-71) 111/71 (11/04 1139) SpO2:  [  89 %-100 %] 100 % (11/04 1235) Arterial Line BP: (94-132)/(46-67) 124/63 (11/04 1107) FiO2 (%):  [30 %-50 %] 50 % (11/04 1235) Weight:  [72.2 kg] 72.2 kg (11/04 0500)  GENERAL: Intubated, not on sedation in the ICU Psych: Unable to assess due to patient's condition Head: Normocephalic and atraumatic, without obvious abnormality EENT: Normal conjunctivae, dry mucous membranes, oral ETT in place, secured LUNGS: Tachypnea on mechanical ventilator. Oral ETT in place.  CV: Regular rate and rhythm on telemetry ABDOMEN: Soft, non-tender, non-distended Extremities: internal rotation of bilateral ankles with dark purple and blue discoloration of feet bilaterally, cool to touch.  NEURO:  Mental Status: Intubated, not on sedation in the ICU Her eyes open spontaneously but she does not fixate or track examiner, she does not follow commands She is unable to provide a clear and coherent history of present illness Cranial Nerves:  II: PERRL. III, IV, VI: Dysconjugate gaze, VOR intact V: Blink to threat is inconsistent throughout VII: Face appears  symmetric at rest with limited mouth symmetry evaluation 2/2 oral ETT with securement device. Patient does not grimace VIII: Unable to assess due to patient's condition IX, X: Cough intact, unable to advance [place] to assess gag reflex XI: Head is grossly midline XII: Patient does not protrude tongue to command Motor: Bilateral lower extremities have ankle inversion at rest. She appears to have decorticate posturing with noxious stimuli of bilateral lower extremities.  Ankles are plantar flexed with increased tone and rigidity throughout.  Left upper extremity is flexed inward toward the head with consistent arm elevation. Right upper extremity does not sustain antigravity movement. Bilateral upper extremities withdraw with noxious stimuli application  Tone is increased. Bulk is normal.  Sensation: Decorticate posturing of BLE with slightly increased response on the left, bilateral upper extremities withdraw with application of noxious stimuli Coordination: Unable to assess, patient does not follow commands DTRs: 3+ and symmetric throughout. Difficulty assessing clonus with persistent ankle plantar flexion with ridigity Plantars: Toes upgoing bilaterally Gait: Deferred  NIHSS:  1a Level of Conscious.: 0 1b LOC Questions: 2 1c LOC Commands: 2 2 Best Gaze: 0 3 Visual: 0 4 Facial Palsy: 0 5a Motor Arm - left: 0 5b Motor Arm - Right: 2 6a Motor Leg - Left: 3 6b Motor Leg - Right: 3 7 Limb Ataxia: 0 8 Sensory: 0 9 Best Language: 2 10 Dysarthria: UN 11 Extinct. and Inatten.: 0 TOTAL: 14  Labs I have reviewed labs in epic and the results pertinent to this consultation are: CBC    Component Value Date/Time   WBC 3.2 (L) 08/01/2021 0357   RBC 2.81 (L) 08/01/2021 0357   HGB 9.6 (L) 08/01/2021 0357   HGB 9.9 (L) 08/01/2021 0357   HCT 27.6 (L) 08/01/2021 0357   HCT 29.0 (L) 08/01/2021 0357   PLT 75 (L) 08/01/2021 0357   MCV 98.2 08/01/2021 0357   MCH 34.2 (H) 08/01/2021 0357    MCHC 34.8 08/01/2021 0357   RDW 11.7 08/01/2021 0357   LYMPHSABS 3.1 08/18/2021 1226   MONOABS 0.4 08/25/2021 1226   EOSABS 0.3 08/14/2021 1226   BASOSABS 0.0 08/03/2021 1226   CMP     Component Value Date/Time   NA 132 (L) 08/01/2021 0357   NA 131 (L) 08/01/2021 0357   K 3.4 (L) 08/01/2021 0357   K 3.4 (L) 08/01/2021 0357   CL 94 (L) 08/01/2021 0357   CO2 22 08/01/2021 0357   GLUCOSE 138 (H) 08/01/2021 0357   BUN 36 (  H) 08/01/2021 0357   CREATININE 4.22 (H) 08/01/2021 0357   CALCIUM 7.0 (L) 08/01/2021 0357   PROT 4.3 (L) 08/01/2021 0357   ALBUMIN 2.2 (L) 08/01/2021 0357   AST 3,326 (H) 08/01/2021 0357   ALT 1,128 (H) 08/01/2021 0357   ALKPHOS 145 (H) 08/01/2021 0357   BILITOT 3.1 (H) 08/01/2021 0357   GFRNONAA 12 (L) 08/01/2021 0357   GFRAA >90 06/16/2014 0518   Lipid Panel     Component Value Date/Time   TRIG 234 (H) 07/30/2021 0351   No results found for: HGBA1C  Drugs of Abuse     Component Value Date/Time   LABOPIA NONE DETECTED 08/13/2021 1226   COCAINSCRNUR NONE DETECTED 08/12/2021 1226   LABBENZ NONE DETECTED 08/18/2021 1226   AMPHETMU NONE DETECTED 08/23/2021 1226   THCU NONE DETECTED 08/26/2021 1226   LABBARB NONE DETECTED 08/05/2021 1226    Lab Results  Component Value Date   CKTOTAL 1,684 (H) 08/01/2021   TROPONINI <0.30 06/15/2014   Imaging I have reviewed the images obtained:  CT-scan of the brain 08/22/2021: 1. No compelling findings of acute stroke/global anoxic injury. No intracranial hemorrhage or acute intracranial findings. 2. A small chronic lacunar infarct in the right periventricular white matter is noted. Although this has chronic characteristics, it was not present on the 02/28/2016 CT scan. 3. Mildly age advanced cerebral atrophy manifesting as sulcal prominence along the vertex. 4. Chronic paranasal sinusitis. Air-fluid levels in the sphenoid sinuses could be from intubation or acute sinusitis.  MRI examination of the brain  11/4: Small area of acute infarct right occipital cortex. Restricted diffusion in the upper campus bilaterally compatible with recent ischemia Mild chronic ischemic change. Mucosal edema in the paranasal sinuses with air-fluid levels.  MR cervical spine 11/4: Negative for cord compression or cord infarct. Mild cervical spine degenerative change Sinusitis with air-fluid levels.  Echocardiogram 11/1:  1. Left ventricular ejection fraction, by estimation, is 25 to 30%. The left ventricle has severely decreased function. The left ventricle demonstrates global hypokinesis. Left ventricular diastolic function could not be evaluated.   2. Right ventricular systolic function is low normal. The right ventricular size is normal.   3. The mitral valve is normal in structure. Trivial mitral valve regurgitation.   4. Aortic valve regurgitation is mild. Mild aortic valve sclerosis is present, with no evidence of aortic valve stenosis.   Assessment: 55 y.o. female who presented to the ED following out of hospital VF arrest with 3 shocks, 1 epi, and intubation with a total of 20 minutes of CPR prior to ROSC. Patient remains encephalopathic in the ICU with MRI imaging concerning for acute right occipital infarction and restricted diffusion in the hippocampus bilaterally compatible with recent ischemia.   Recommendations: - HgbA1c, fasting lipid panel - Unable to obtain Fentanyl level from PTA due to Fentanyl given since admission - MRA head and neck without contrast for vessel imaging given eGFR  - Frequent neuro checks - Prophylactic therapy-Antiplatelet med: asa 61m daily - Risk factor modification - Telemetry monitoring - PT consult, OT consult, Speech consult   SAnibal Henderson AGAC-NP [facility] Pager: [phone] I have seen the patient and reviewed the above note. On my exam, she does open her eyes, I have the impression briefly that she fixates me, but she does not reliably  track or engage.  She has triple flexion bilateral lower extremities, withdrawal bilateral upper extremities.  I strongly suspect that she has had some degree of anoxic insult,  but her brain imaging does not appear to be definitive for a dismal prognosis.  Her exam is likely somewhat confounded by an antecedent progressive process that from description sounds like a myelopathy.  Her cervical spine is negative, but her lower extremities appear to be more affected than her uppers and therefore I will get an MRI of her thoracic spine as well.  I will also send some metabolic myelopathy labs.  Given discordant exam and imaging, I think an overnight EEG would be prudent as well.  The stroke is likely incidental, related to her arrest and unlikely to be related to her current exam or situation.  Roland Rack, MD [facility] [phone]  If 7pm- 7am, please page neurology on call as listed in [place].

## 2021-08-01 NOTE — Progress Notes (Addendum)
1030- Pt. Vomited large amount of tube feeds. MD aware, hold tube feeds at this time.  1100- 700cc undigested tube feeds removed from stomach prior to supine, HOB  flat position for MRI. Patient transported to MRI with RN, RT and transport without issue. 1330- MD and NP notified of oliguria after lasix dose given. See new orders

## 2021-08-01 NOTE — Progress Notes (Signed)
NAME:  Tara [name], MRN:  [id number], DOB:  November 19, 1965, LOS: 3 ADMISSION DATE:  08/10/2021, CONSULTATION DATE:  08/09/2021 REFERRING MD:  Maryan Rued, CHIEF COMPLAINT:  Post Cardiac Arrest   History of Present Illness:  Patient is a 55 year old female with a history of hypertension, recent issues with lower extremity weakness, shaking and recent weight loss presenting today as a cardiac arrest.  Patient's husband and EMS give the history.  Husband reports that over the last month or 2 patient has had worsening lower extremity weakness, shakiness, tingling in her legs and saw neurology for the first time today.  They had ordered an MRI and they had come home and she went inside and sat down.  Her husband went out to talk with the plumber and 5 minutes later came back in and noticed she was unresponsive sitting in the chair.  He did not notice that she was breathing and called 911.  He did start bystander CPR.  The call came in at 1115.  Fire arrived at 1118 and on their arrival they initiated CPR.  AED did suggest shock as patient was in V. fib.  She was shocked x2 when EMS arrived she was still in V. fib received 1 mg of epi and a third shock which put her back into sinus rhythm.  She was intubated with a 7.0 tube and did have spontaneous breaths.  Husband denies patient recently having any illnesses or complaining of chest pain or shortness of breath.  For the last month she had complained of fatigue.  She had still been eating and drinking.  She does not drink alcohol regularly but husband reports she does smoke heavily. CPR x 20 minutes  Upon arrival to the ED patient was Covid +, which was an incidental finding Na 130/ K 4.1 initially, then 5.2, Creatinine 1.52, Alk Phos 148, AST 290/ ALT 132/ Total Protein 5.1/ Troponin 48/ Lactate 7.2  She was intubated in the ED at 12:34 pm, ABG at 14:10 was 7.19/ 35/82/15/13/13.6,  This was on a rate of 16. She was overbreathing the vent. Saturation was 93%  WBC  is 9.2/ HGB is 10.8/ platelets are 73K  CT head was negative for acute stroke / global anoxic damage. No ICH or acute intracranial findings/ small chronic lacunar infarct in the right periventricular white matter   She remains critically ill  Pertinent  Medical History   Past Medical History:  Diagnosis Date   Anxiety state, unspecified    Clavicle fracture    right   Depression    Essential hypertension, benign    History of pneumonia    Heavy smoker  Significant Hospital Events: Including procedures, antibiotic start and stop dates in addition to other pertinent events   08/18/2021 Admission post v fib arrest ,Flail chest added PEEP 11/2  on epi gtt, acidosis corrected by bicarb  Interim History / Subjective:   Ventilated  Fentanyl gtt 11mcg  Unable to obtain subjective evaluation due to patient status   Objective   Blood pressure 125/65, pulse 68, temperature 99.3 F (37.4 C), temperature source Bladder, resp. rate (!) 26, height $RemoveBe'5\' 5"'yGgXgddAZ$  (1.651 m), weight 72.2 kg, last menstrual period 08/26/2011, SpO2 97 %.    Vent Mode: PRVC FiO2 (%):  [30 %-50 %] 50 % Set Rate:  [26 bmp] 26 bmp Vt Set:  [450 mL] 450 mL PEEP:  [5 cmH20] 5 cmH20 Plateau Pressure:  [18 cmH20-19 cmH20] 18 cmH20   Intake/Output Summary (Last 24 hours) at  08/01/2021 0827 Last data filed at 08/01/2021 0400 Gross per 24 hour  Intake 2323.18 ml  Output 129 ml  Net 2194.18 ml   Filed Weights   07/30/21 0500 07/31/21 0500 08/01/21 0500  Weight: 66.6 kg 71.1 kg 72.2 kg    Examination:  General: In bed, NAD, appears older than age 24: MM pink/moist, anicteric, atraumatic Neuro: RASS -2, PERRL 63mm, Flexion in upper extremities, contracted in BLUE, foot drop appearing BLLE, slight movement RLE, breathing over vent, not tracking, + Corneal, unable to assess cough and gag CV: S1S2, NSR, no m/r/g appreciated PULM:  air movement appreciated in all lobes, trachea midline, chest expansion symmetric GI:  soft, bsx4 active, non-tender   Extremities:  no pretibial edema, Warm/dry BLUE, BL toes dusky, feet warm, faint pedial pulse Skin:  no rashes or lesions noted  ABG: 7.4/37/75/24  LFT downtrending Thrombocytopenia improving Creatinine bump   Resolved Hospital Problem list     Assessment & Plan:  Post V Fib Arrest,Shocked x 3 before ROSC'20 minutes CPR , etiology not clear possible primary arrhythmia Cardiogenic shock-Resolved EF down to 30%, global hypokinesis, good co-ox Elevated troponins postarrest, cannot rule out ischemic etiology, has coronary artery calcification Elevated troponin suspected post arrest, Cards suspects LVF will improve with time. Plan: -Continue telemetry -PRN EKG -80 furosemide today -Off amiodarone -Heparin stopped per cards. -Cards following. Cath per cards  Acute respiratory failure in setting of Cardiac Arrest Flail chest Heavy Smoker Bibasal pneumonia possible aspiration  P: -LTVV strategy with tidal volumes of 4-8 cc/kg ideal body weight -Goal plateau pressures less than 30 and driving pressures less than 15 -Wean PEEP/FiO2 for SpO2 92-98% -VAP bundle -Daily SAT and SBT -RASS goal 0 to -1 -Follow intermittent CXR and ABG PRN -Stop fentanyl gtt, prn fentanyl push for pain -Day 4 of 7 for unasyn -Stop bicarb gtt  Covid + ( Incidental Finding on admission )  Remdesivir stopped due to elevated LFTs P: Continue dexamethasone 6mg  day 2/10 Ventilatory support as above  Acute anoxic encephalopathy CT Head with no acute stroke or global anoxia EEG negative Prior to admission concern for cervical myelopathy Plan -Brain MRI and CSP MRI today -Cease normothermia protocol -Continue neuroprotective measures- normothermia, euglycemia, HOB greater than 30, head in neutral alignment, normocapnia, normoxia.  -Stop fent gtt, minimize sedation -Frequent neuro checks  AKI , likely ATN postarrest Hyponatremia, Hypocalcemia, hypokalemia Creat  2.93>4.22 Plan -Ensure renal perfusion. Goal MAP 65 or greater. -Avoid neprotoxic drugs as possible. -Strict I&O's -Follow up AM creatinine, NA, K -Lasix challenge. 80mg  once IV -Cautious K replacement  Thrombocytopenia , likely related to EtOH use PLT 47>75 Plan -If no signs of active bleeding, consider PLT transfusion if less than 10K -If signs of active bleeding or surgical procedure indicated, consider PLT transfusion less than 50K -Follow up AM PLT levels -Monitor for signs of active bleeding  Normocytic Anemia HGB 9.6. ? Chronic -Transfuse PRBC if HBG less than 7 -Obtain AM CBC to trend H&H -Monitor for signs of bleeding  Shock liver AST 10K>3326, ALT 1600>1128, bili 3.1, ALK 145 -Supportive care -MAP greater than 65 -Follow LFT  Ischemic appearing toes chronic per husband, feet warm, ? PAD related to smoking -monitor   Best Practice (right click and "Reselect all SmartList Selections" daily)   Diet/type: tubefeeds DVT prophylaxis: prophylactic heparin  GI prophylaxis: PPI Lines: Central line Foley:  Yes, and it is still needed Code Status: Limited code Last date of multidisciplinary goals of care discussion [11/2] -mom  Vaughan Basta and husband , they agree to no more CPR but continue current life support measures. Will reassess after MRI.  Critical care time: 37 minutes  Redmond School., MSN, APRN, AGACNP-BC [facility]  08/01/2021 , 8:28 AM  Please see [email or web address] for pager details  If no response, please call [phone] After hours, please call Elink at [phone]

## 2021-08-01 NOTE — Progress Notes (Addendum)
Intermittent progress note  Notified by Jinny Blossom RN of 61ml UOP S/P 80mg  IV furosemide. Dr. Joylene Grapes with [facility] consulted for assistance with care in the setting of rising creatinine.  Per [facility], does not need dialysis at this moment. Team will see.  Will continue goal of care conversations.  Redmond School., MSN, APRN, AGACNP-BC [facility]  08/01/2021 , 2:06 PM  Please see [email or web address] for pager details  If no response, please call [phone] After hours, please call Elink at [phone]

## 2021-08-01 NOTE — Progress Notes (Signed)
[facility] will sign off.   Medication Recommendations:  None Other recommendations (labs, testing, etc): If there is miraculous recovery, ischemic evaluation with coronary angiography would be a consideration. Follow up as an outpatient: TBD

## 2021-08-01 NOTE — Consult Note (Signed)
Nephrology Consult   Requesting provider: Cheri Fowler Service requesting consult: CCM Reason for consult: AKI   Assessment/Recommendations: Tara [name] is a/an 55 y.o. female with a past medical history HTN, weight loss, lower extremity weakness who present w/ cardiac arrest complicated by acute renal failure  Anuric AKI: No response to Lasix today.  Likely related to cardiac arrest causing ATN. -Care is purely supportive at this time -No acute indication for dialysis at this time but likely will occur in the next 24 to 48 hours.  Continue goals of care conversations at this time to determine whether dialysis would be deemed appropriate and necessary. -Continue to monitor daily Cr, Dose meds for GFR -Monitor Daily I/Os, Daily weight  -Maintain MAP>65 for optimal renal perfusion.  -Avoid nephrotoxic medications including NSAIDs and Vanc/Zosyn combo  V fib Cardiac arrest: Continue management of electrolyte disturbances as below.  Supportive care per primary team  VDRF: Vent management per primary team  Hypokalemia: Continue with supplementation as needed  Altered mental status: Related to arrest.  Undergoing imaging to determine whether we may see if some improvement or not.  Aspiration pneumonia: Finish out antibiotic course per primary team  COVID positive: Found on admission.  Remdesivir stopped due to LFTs. Continue management per primary team   Recommendations conveyed to primary service.    Darnell Level [facility] 08/01/2021 2:30 PM   _____________________________________________________________________________________ CC: Cardiac arrest  History of Present Illness: Tara [name] is a/an 55 y.o. female with a past medical history of hypertension who presents with cardiac arrest.  I was not able to obtain history from the patient.  History was obtained per chart review.  It is reported that the patient has had worsening lower extremity weakness and  shakiness of her legs.  She was being evaluated by neurology.  Prior to admission she became unresponsive and they called 911.  CPR was performed at the scene.  She required multiple rounds of resuscitation after she was found to be in ventricular fibrillation.  ROSC was achieved. She was admitted to the hospital for further management.  Since the hospitalization the patient has stabilized.  She had several electrolyte abnormalities including hypokalemia which has been repleted.  She is no longer requiring pressors.  She is intubated.  Her trajectory is slightly unknown at this time and undergoing evaluation with imaging.  She had major elevations in her LFTs which are improving.  Her creatinine has been rising steadily to 4.2 today.  Was 1.3 in September 2022.   Medications:  Current Facility-Administered Medications  Medication Dose Route Frequency Provider Last Rate Last Admin   0.9 %  sodium chloride infusion  250 mL Intravenous Continuous Oretha Milch, MD       Ampicillin-Sulbactam (UNASYN) 3 g in sodium chloride 0.9 % 100 mL IVPB  3 g Intravenous Q12H Chand, Garnet Sierras, MD       chlorhexidine gluconate (MEDLINE KIT) (PERIDEX) 0.12 % solution 15 mL  15 mL Mouth Rinse BID Cyril Mourning V, MD   15 mL at 08/01/21 1025   Chlorhexidine Gluconate Cloth 2 % PADS 6 each  6 each Topical Daily Oretha Milch, MD   6 each at 07/31/21 2209   dexamethasone (DECADRON) tablet 6 mg  6 mg Per Tube Daily Oretha Milch, MD   6 mg at 08/01/21 0859   docusate (COLACE) 50 MG/5ML liquid 100 mg  100 mg Per Tube BID Gwyneth Sprout, MD   100 mg at 08/01/21 [id number]  docusate (COLACE) 50 MG/5ML liquid 100 mg  100 mg Per Tube BID PRN Henri Medal, RPH       feeding supplement (PROSource TF) liquid 45 mL  45 mL Per Tube BID Rigoberto Noel, MD   45 mL at 08/01/21 0852   feeding supplement (VITAL 1.5 CAL) liquid 1,000 mL  1,000 mL Per Tube Continuous Rigoberto Noel, MD   Stopped at 08/01/21 1100   fentaNYL (SUBLIMAZE)  injection 50-200 mcg  50-200 mcg Intravenous Q30 min PRN Reome, Earle J, RPH   100 mcg at 08/09/2021 1752   heparin injection 5,000 Units  5,000 Units Subcutaneous Q12H Rigoberto Noel, MD   5,000 Units at 08/01/21 1024   insulin aspart (novoLOG) injection 0-6 Units  0-6 Units Subcutaneous Q4H Rigoberto Noel, MD   2 Units at 07/30/21 1148   levalbuterol (XOPENEX) nebulizer solution 0.63 mg  0.63 mg Nebulization Q6H PRN Magdalen Spatz, NP       MEDLINE mouth rinse  15 mL Mouth Rinse 10 times per day Rigoberto Noel, MD   15 mL at 08/01/21 1417   metoCLOPramide (REGLAN) injection 10 mg  10 mg Intravenous Q6H Jacky Kindle, MD   10 mg at 08/01/21 1417   ondansetron (ZOFRAN) injection 4 mg  4 mg Intravenous Q6H PRN Cristal Generous, NP       [START ON 08/02/2021] pantoprazole (PROTONIX) injection 40 mg  40 mg Intravenous Q24H Henri Medal, RPH       polyethylene glycol (MIRALAX / GLYCOLAX) packet 17 g  17 g Per Tube Daily Blanchie Dessert, MD   17 g at 08/01/21 0852   polyethylene glycol (MIRALAX / GLYCOLAX) packet 17 g  17 g Per Tube Daily PRN Rigoberto Noel, MD       potassium chloride 10 mEq in 50 mL *CENTRAL LINE* IVPB  10 mEq Intravenous Q1 Hr x 2 Estill Cotta, NP 50 mL/hr at 08/01/21 1351 10 mEq at 08/01/21 1351   sodium chloride flush (NS) 0.9 % injection 10-40 mL  10-40 mL Intracatheter Q12H Rigoberto Noel, MD   10 mL at 08/01/21 1026   sodium chloride flush (NS) 0.9 % injection 10-40 mL  10-40 mL Intracatheter PRN Rigoberto Noel, MD   10 mL at 07/30/21 0917     ALLERGIES Codeine, Effexor [venlafaxine], Sulfa antibiotics, Sulfasalazine, and Wellbutrin [bupropion]  MEDICAL HISTORY Past Medical History:  Diagnosis Date   Anxiety state, unspecified    Clavicle fracture    right   Depression    Essential hypertension, benign    History of pneumonia      SOCIAL HISTORY Social History   Socioeconomic History   Marital status: Married    Spouse name: Not on file   Number of children:  0   Years of education: Not on file   Highest education level: Not on file  Occupational History   Occupation: student  Tobacco Use   Smoking status: Every Day    Packs/day: 1.00    Years: 20.00    Pack years: 20.00    Types: Cigarettes   Smokeless tobacco: Never  Substance and Sexual Activity   Alcohol use: Yes    Alcohol/week: 7.0 standard drinks    Types: 7 Glasses of wine per week    Comment: social   Drug use: No   Sexual activity: Not Currently  Other Topics Concern   Not on file  Social History Narrative   Not  on file   Social Determinants of Health   Financial Resource Strain: Not on file  Food Insecurity: Not on file  Transportation Needs: Not on file  Physical Activity: Not on file  Stress: Not on file  Social Connections: Not on file  Intimate Partner Violence: Not on file     FAMILY HISTORY Family History  Problem Relation Age of Onset   Breast cancer Mother    Diabetes Mother        pre-diabetes   Hypertension Mother    Prostate cancer Father    Hypertension Father    Ovarian cancer Maternal Grandmother    Stroke Maternal Grandfather    Breast cancer Paternal Grandmother    Colon cancer Paternal Grandfather    Stomach cancer Cousin       Review of Systems: Unable to obtain given sedation  Physical Exam: Vitals:   08/01/21 1400 08/01/21 1420  BP:    Pulse: 67   Resp: (!) 26   Temp:    SpO2: 92% 92%   Total I/O In: -  Out: 715 [Urine:15; Emesis/NG output:700]  Intake/Output Summary (Last 24 hours) at 08/01/2021 1430 Last data filed at 08/01/2021 1236 Gross per 24 hour  Intake 1808.32 ml  Output 784 ml  Net 1024.32 ml   General: ill-appearing, lying in bed, sedated HEENT: eyes closed, no nasal discharge, MMM CV: normal rate, no significant edema Lungs: ventilated, bilateral chest rise, coarse upper airway sounds Abd: soft, non-distended Skin: dusky toes, feet and hands warm, no edema Psych: sedated, not  interactive Musculoskeletal: no obvious deformities Neuro: sedated, does not respond  Test Results Reviewed Lab Results  Component Value Date   NA 132 (L) 08/01/2021   NA 131 (L) 08/01/2021   K 3.4 (L) 08/01/2021   K 3.4 (L) 08/01/2021   CL 94 (L) 08/01/2021   CO2 22 08/01/2021   BUN 36 (H) 08/01/2021   CREATININE 4.22 (H) 08/01/2021   CALCIUM 7.0 (L) 08/01/2021   ALBUMIN 2.2 (L) 08/01/2021   PHOS 3.6 08/01/2021     I have reviewed all relevant outside healthcare records related to the patient's current hospitalization

## 2021-08-01 NOTE — Procedures (Signed)
Cortrak  Person Inserting Tube:  Shanquita Ronning, Creola Corn, RD Tube Type:  Cortrak - 43 inches Tube Size:  10 Tube Location:  Left nare Initial Placement:  Postpyloric Secured by: Bridle Technique Used to Measure Tube Placement:  Marking at nare/corner of mouth Cortrak Secured At:  74 cm  Cortrak Tube Team Note:  Consult received to place a Cortrak feeding tube. 2 Cortrak RDs made multiple attempts at bedside to place postpyloric feeding tube, unsure if attempts successful at this time. Xray has been ordered.  X-ray is required, abdominal x-ray has been ordered by the Cortrak team. Please confirm tube placement before using the Cortrak tube.   If the tube becomes dislodged please keep the tube and contact the Cortrak team at [email or web address] (password [email or web address]) for replacement.  If after hours and replacement cannot be delayed, place a NG tube and confirm placement with an abdominal x-ray.    Larkin Ina, MS, RD, LDN (she/her/hers) RD pager number and weekend/on-call pager number located in [place].

## 2021-08-01 NOTE — Progress Notes (Signed)
Brief Nutrition Note  RD was notified that pt had large volume emesis consisting of tube feeds earlier this morning. Tube feeds were held and OG tube was placed to suction with 700 ml of tube feeds immediately suctioned from stomach per RN note.  Discussed pt with CCM NP. Per discussion, IV reglan added and soap suds enema administered. Cortrak team attempted to place post-pyloric Cortrak tube but unable to advance past pylorus. Abdominal x-ray confirmation of gastric placement is pending.  CCM NP okay with resuming trickle tube feeds via gastric Cortrak once placement confirmed.  RD to adjust tube feeding orders: - Vital 1.5 @ 20 ml/hr (480 ml/day) once Cortrak placement confirmed with x-ray  Trickle tube feeding regimen provides 720 kcal, 32 grams of protein, and 367 ml of H2O.    Gustavus Bryant, MS, RD, LDN Inpatient Clinical Dietitian Please see AMiON for contact information.

## 2021-08-01 NOTE — Progress Notes (Signed)
[facility] Progress Note Patient Name: Tara [name] DOB: August 30, 1966 MRN: [id number]   Date of Service  08/01/2021  HPI/Events of Note  Hypokalemia  Hypocalcemia - 1. K+ = 3.4 and Creatinine = 4.22. Ca++ = 7.0 which corrects to 8.44 (Low) given Albumin = 2.2.  eICU Interventions  Plan: Will not replace K+ d/t renal dysfunction. Will replace Ca++.     Intervention Category Major Interventions: Electrolyte abnormality - evaluation and management  Maxcine Strong Eugene 08/01/2021, 5:49 AM

## 2021-08-01 NOTE — Progress Notes (Signed)
[facility] Progress Note Patient Name: Tara [name] DOB: 1965/11/11 MRN: [id number]   Date of Service  08/01/2021  HPI/Events of Note  Increased swelling of lower lip noticed by nursing since beginning of this shift. Patient lost 2 bottom teeth yesterday during ETT  exchange. Swelling likely related to this traumatic event.   eICU Interventions  Plan: Ice packs to lower lip/face PRN swelling.      Intervention Category Major Interventions: Other:  Marcelle Hepner Cornelia Copa 08/01/2021, 1:00 AM

## 2021-08-01 NOTE — Progress Notes (Signed)
ABI completed. Refer to "CV Proc" under chart review to view preliminary results.  Preliminary results discussed with Dr. Tamala Julian.  08/01/2021 5:00 PM Kelby Aline., MHA, RVT, RDCS, RDMS

## 2021-08-02 ENCOUNTER — Inpatient Hospital Stay (HOSPITAL_COMMUNITY): Payer: BC Managed Care – PPO

## 2021-08-02 DIAGNOSIS — G928 Other toxic encephalopathy: Secondary | ICD-10-CM

## 2021-08-02 DIAGNOSIS — R569 Unspecified convulsions: Secondary | ICD-10-CM

## 2021-08-02 DIAGNOSIS — I469 Cardiac arrest, cause unspecified: Secondary | ICD-10-CM | POA: Diagnosis not present

## 2021-08-02 LAB — POCT I-STAT 7, (LYTES, BLD GAS, ICA,H+H)
Acid-Base Excess: 1 mmol/L (ref 0.0–2.0)
Bicarbonate: 26.4 mmol/L (ref 20.0–28.0)
Calcium, Ion: 1.04 mmol/L — ABNORMAL LOW (ref 1.15–1.40)
HCT: 32 % — ABNORMAL LOW (ref 36.0–46.0)
Hemoglobin: 10.9 g/dL — ABNORMAL LOW (ref 12.0–15.0)
O2 Saturation: 94 %
Patient temperature: 98.5
Potassium: 4.4 mmol/L (ref 3.5–5.1)
Sodium: 133 mmol/L — ABNORMAL LOW (ref 135–145)
TCO2: 28 mmol/L (ref 22–32)
pCO2 arterial: 43.9 mmHg (ref 32.0–48.0)
pH, Arterial: 7.388 (ref 7.350–7.450)
pO2, Arterial: 71 mmHg — ABNORMAL LOW (ref 83.0–108.0)

## 2021-08-02 LAB — COMPREHENSIVE METABOLIC PANEL
ALT: 785 U/L — ABNORMAL HIGH (ref 0–44)
AST: 1145 U/L — ABNORMAL HIGH (ref 15–41)
Albumin: 2.1 g/dL — ABNORMAL LOW (ref 3.5–5.0)
Alkaline Phosphatase: 237 U/L — ABNORMAL HIGH (ref 38–126)
Anion gap: 14 (ref 5–15)
BUN: 56 mg/dL — ABNORMAL HIGH (ref 6–20)
CO2: 24 mmol/L (ref 22–32)
Calcium: 7.5 mg/dL — ABNORMAL LOW (ref 8.9–10.3)
Chloride: 95 mmol/L — ABNORMAL LOW (ref 98–111)
Creatinine, Ser: 5.34 mg/dL — ABNORMAL HIGH (ref 0.44–1.00)
GFR, Estimated: 9 mL/min — ABNORMAL LOW (ref >60)
Glucose, Bld: 174 mg/dL — ABNORMAL HIGH (ref 70–99)
Potassium: 4.1 mmol/L (ref 3.5–5.1)
Sodium: 133 mmol/L — ABNORMAL LOW (ref 135–145)
Total Bilirubin: 3.9 mg/dL — ABNORMAL HIGH (ref 0.3–1.2)
Total Protein: 4.4 g/dL — ABNORMAL LOW (ref 6.5–8.1)

## 2021-08-02 LAB — VITAMIN B12: Vitamin B-12: 2720 pg/mL — ABNORMAL HIGH (ref 180–914)

## 2021-08-02 LAB — CBC
HCT: 27.6 % — ABNORMAL LOW (ref 36.0–46.0)
Hemoglobin: 9.5 g/dL — ABNORMAL LOW (ref 12.0–15.0)
MCH: 33.7 pg (ref 26.0–34.0)
MCHC: 34.4 g/dL (ref 30.0–36.0)
MCV: 97.9 fL (ref 80.0–100.0)
Platelets: 114 10*3/uL — ABNORMAL LOW (ref 150–400)
RBC: 2.82 MIL/uL — ABNORMAL LOW (ref 3.87–5.11)
RDW: 11.7 % (ref 11.5–15.5)
WBC: 6.2 10*3/uL (ref 4.0–10.5)
nRBC: 0.3 % — ABNORMAL HIGH (ref 0.0–0.2)

## 2021-08-02 LAB — GLUCOSE, CAPILLARY
Glucose-Capillary: 162 mg/dL — ABNORMAL HIGH (ref 70–99)
Glucose-Capillary: 148 mg/dL — ABNORMAL HIGH (ref 70–99)
Glucose-Capillary: 169 mg/dL — ABNORMAL HIGH (ref 70–99)
Glucose-Capillary: 164 mg/dL — ABNORMAL HIGH (ref 70–99)

## 2021-08-02 LAB — MAGNESIUM: Magnesium: 1.9 mg/dL (ref 1.7–2.4)

## 2021-08-02 LAB — PHOSPHORUS: Phosphorus: 3.5 mg/dL (ref 2.5–4.6)

## 2021-08-02 LAB — FOLATE: Folate: 5.8 ng/mL — ABNORMAL LOW (ref >5.9)

## 2021-08-02 LAB — CK: Total CK: 1077 U/L — ABNORMAL HIGH (ref 38–234)

## 2021-08-02 IMAGING — MR MR MRA NECK W/O CM
1 series · 40 of 48 positions shown · non-contrast
Comparison: No pertinent prior exam.

CLINICAL DATA: Stroke, follow up. Recent cardiac arrest.

EXAM:
MRA NECK WITHOUT CONTRAST
TECHNIQUE: Angiographic images of the neck were acquired using MRA technique
without intravenous contrast. Carotid stenosis measurements (when
applicable) are obtained utilizing NASCET criteria, using the distal
internal carotid diameter as the denominator.

[Series 15: [id number]_iso · axial · 0.6mm · 0.52mm/px · z∈[-282,-65]mm · 40 of 381 slices shown]
[im 1/381]
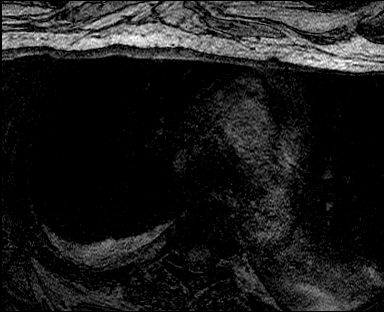
[im 9/381]
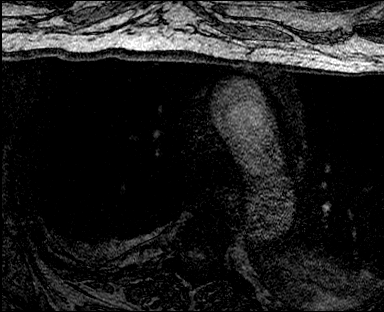
[im 17/381]
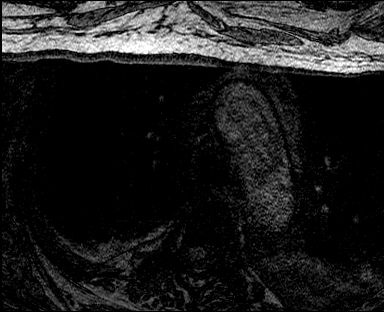
[im 25/381]
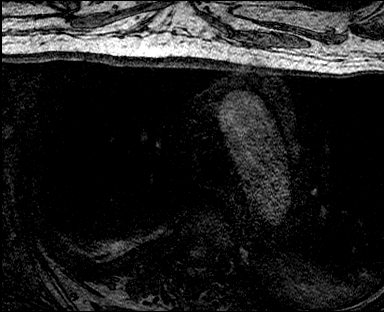
[im 33/381]
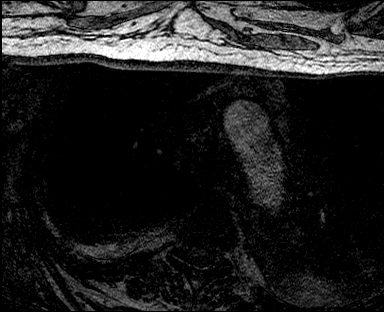
[im 41/381]
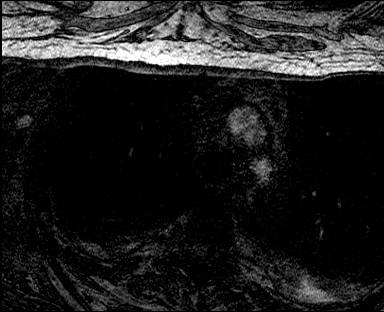
[im 49/381]
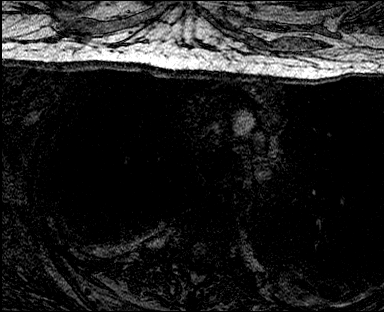
[im 57/381]
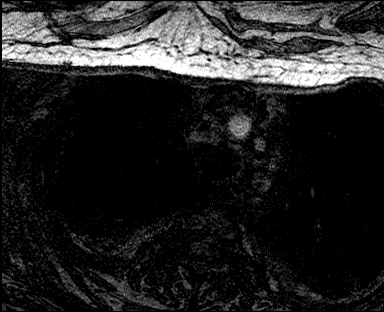
[im 65/381]
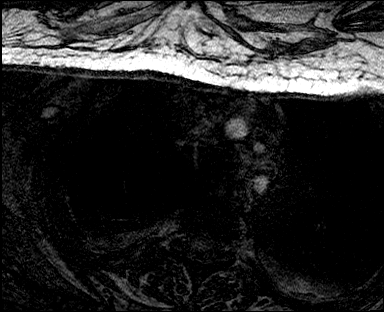
[im 73/381]
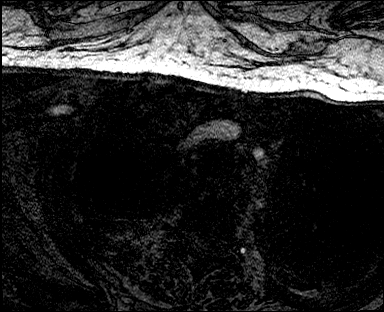
[im 81/381]
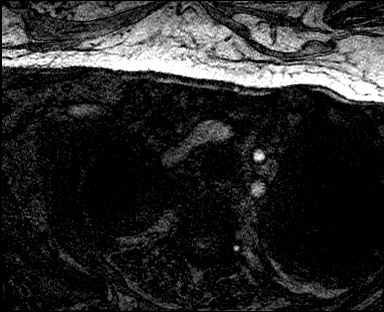
[im 89/381]
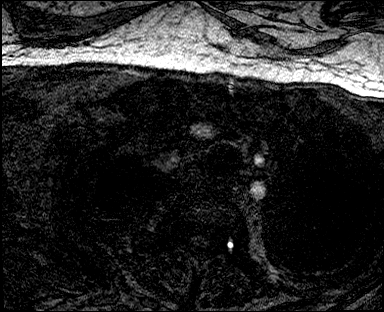
[im 98/381]
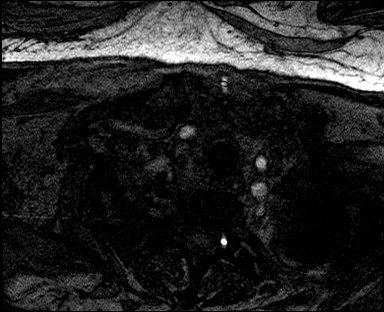
[im 106/381]
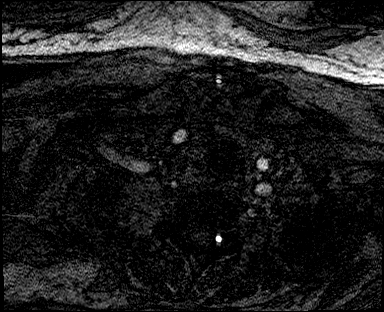
[im 114/381]
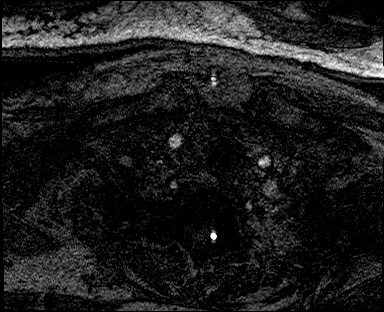
[im 122/381]
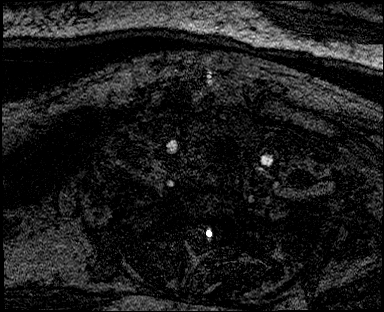
[im 130/381]
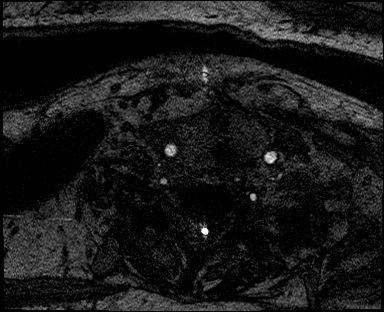
[im 138/381]
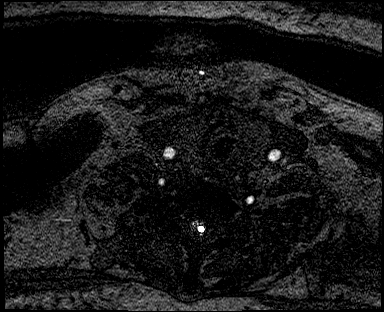
[im 146/381]
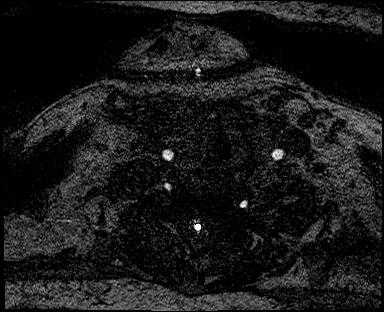
[im 154/381]
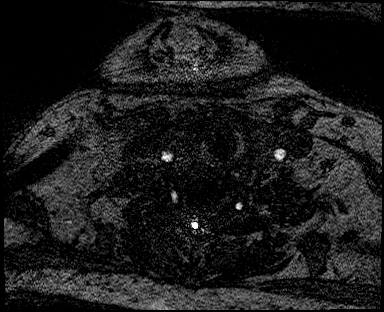
[im 162/381]
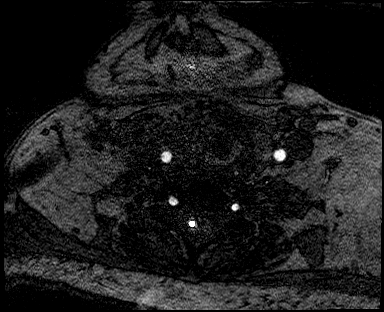
[im 170/381]
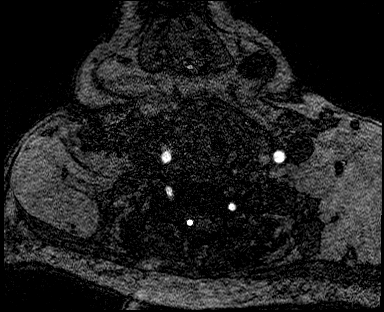
[im 178/381]
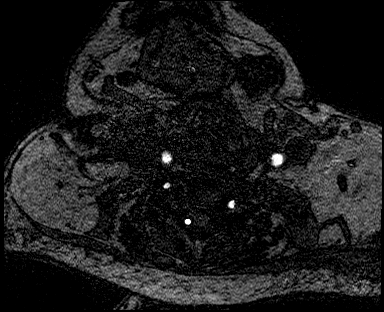
[im 186/381]
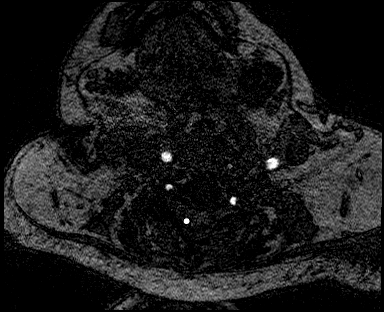
[im 195/381]
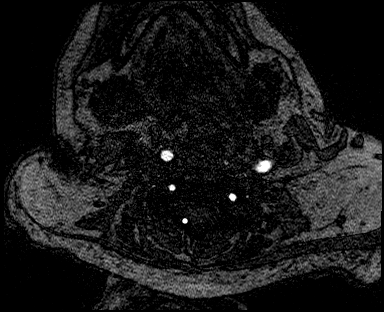
[im 203/381]
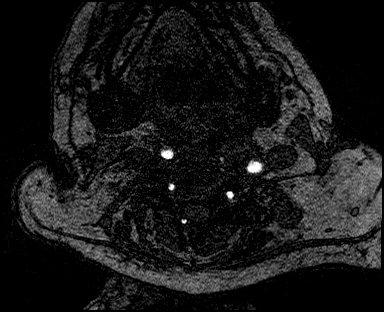
[im 211/381]
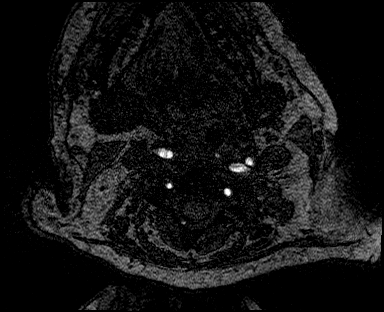
[im 219/381]
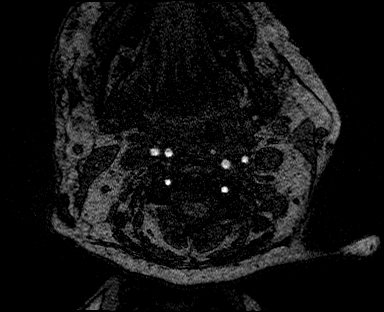
[im 227/381]
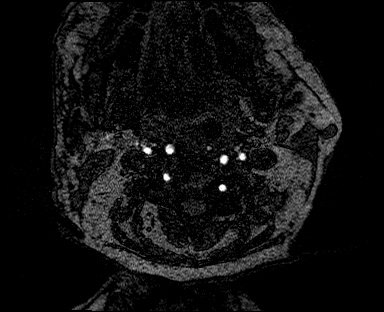
[im 235/381]
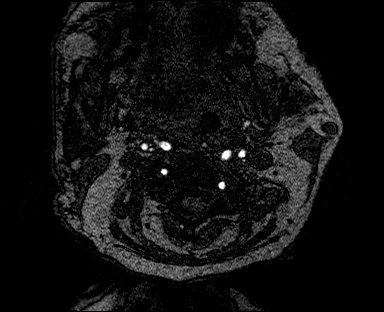
[im 243/381]
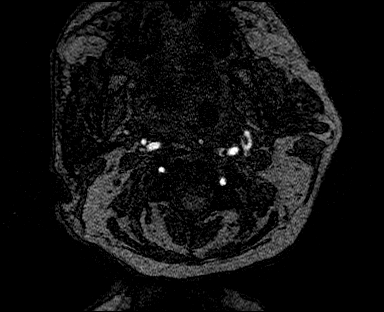
[im 251/381]
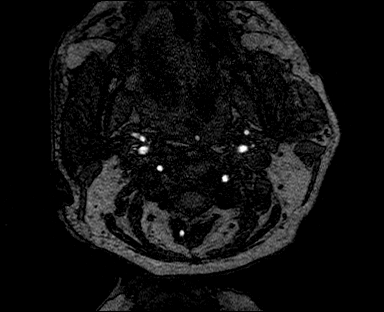
[im 259/381]
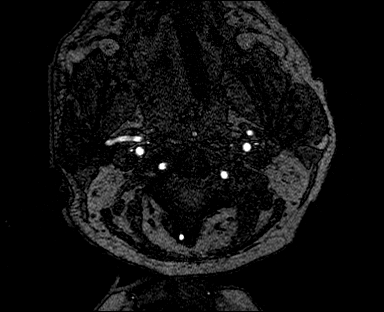
[im 267/381]
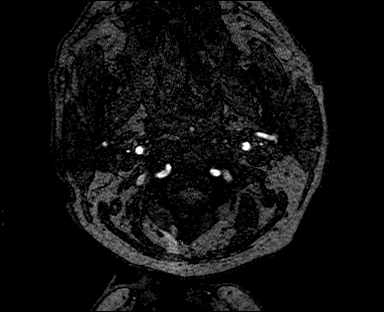
[im 275/381]
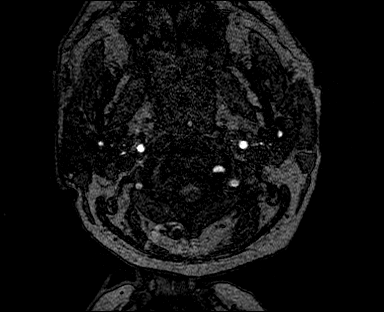
[im 283/381]
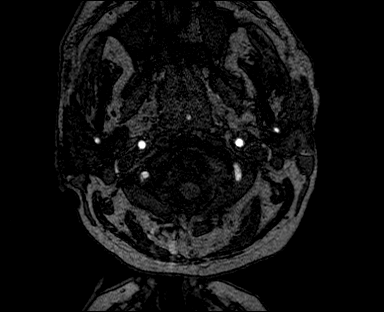
[im 292/381]
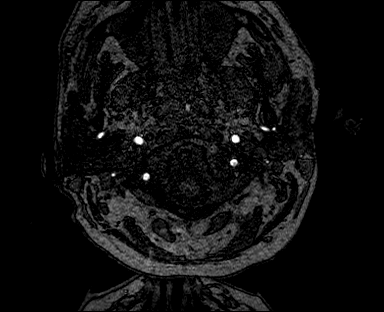
[im 316/381]
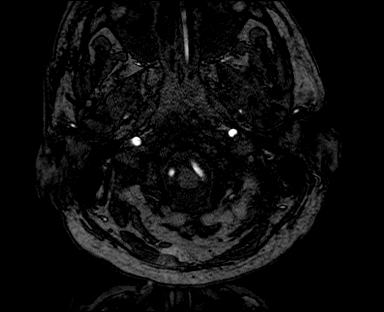
[im 324/381]
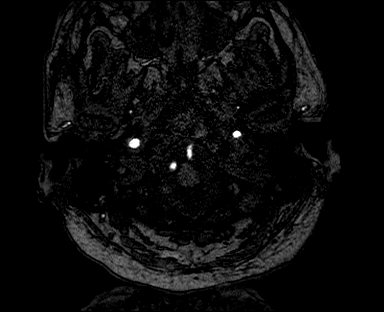
[im 364/381]
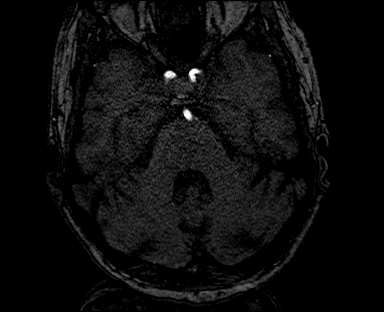

[40 of 48 positions shown; findings below may reference images not displayed]

FINDINGS: Aortic arch: Standard 3 vessel aortic arch. Suboptimal assessment of
the proximal arch vessels due to motion and noncontrast technique.

Right carotid system: Patent without evidence of stenosis or
dissection.

Left carotid system: Patent without evidence of stenosis or
dissection.

Vertebral arteries: The vertebral arteries are patent and codominant
with antegrade flow bilaterally. Assessment of the proximal V1
segments, including the vertebral origins, is limited by motion and
noncontrast technique, particularly on the right. The more distal V1
segments, V2 segments, and proximal V3 segments are normal in
appearance without evidence of a significant stenosis or dissection.
Symmetric signal loss in the distal V3 segments is likely technical
and related to vessel orientation.
IMPRESSION: Patent cervical carotid and vertebral arteries without evidence of a
significant stenosis or dissection within above described study
limitations.

## 2021-08-02 IMAGING — MR MR MRA HEAD W/O CM
1 series · 20 of 48 positions shown · non-contrast
Comparison: No pertinent prior exam.

CLINICAL DATA: Neuro deficit, acute, stroke suspected. Recent
cardiac arrest.

EXAM:
MRA HEAD WITHOUT CONTRAST
TECHNIQUE: Angiographic images of the Circle of Willis were acquired using MRA
technique without intravenous contrast.

[Series 5: 3d cow · axial · 0.5mm · 0.41mm/px · z∈[-131,-48]mm · 20 of 188 slices shown]
[im 1/188]
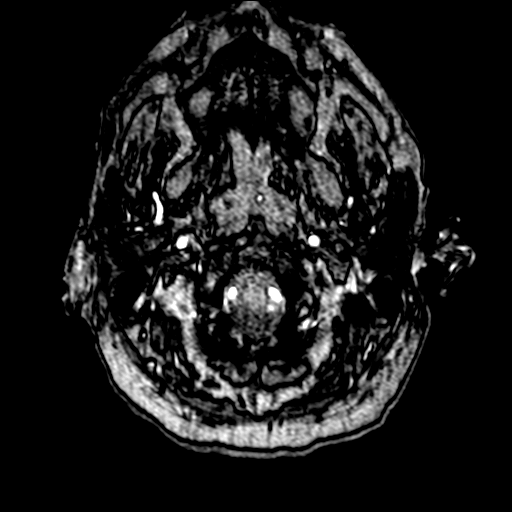
[im 4/188]
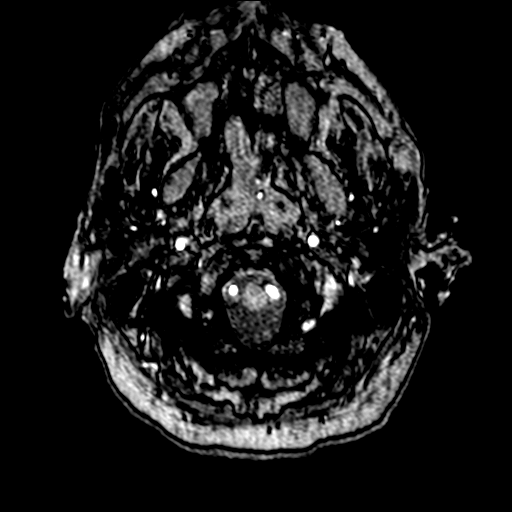
[im 8/188]
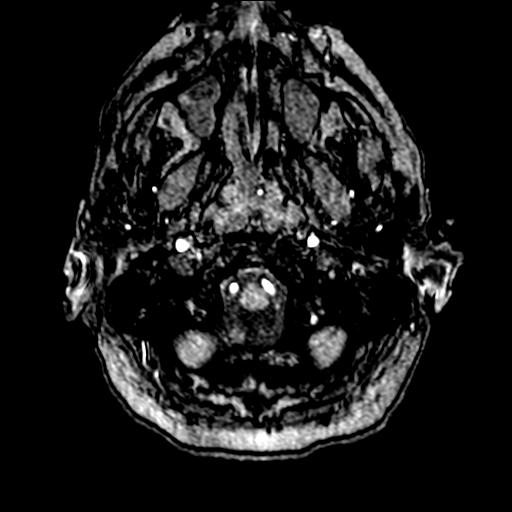
[im 12/188]
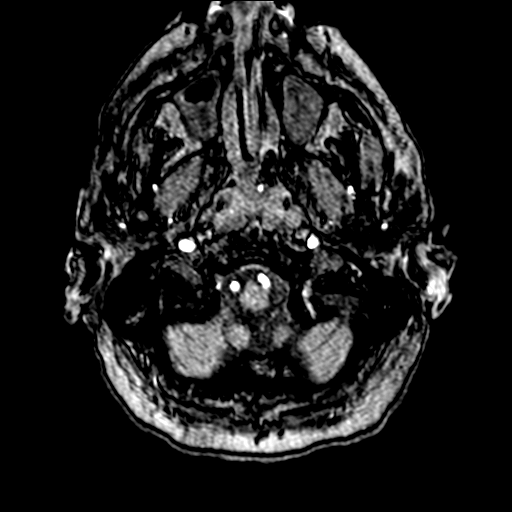
[im 16/188]
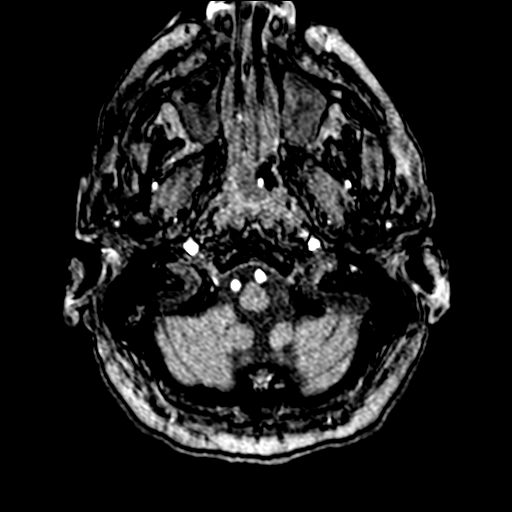
[im 20/188]
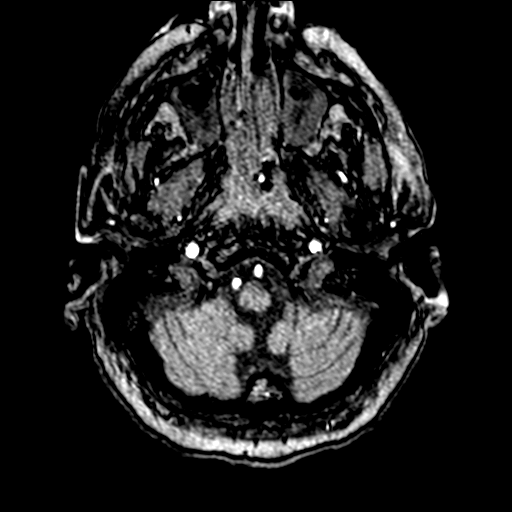
[im 24/188]
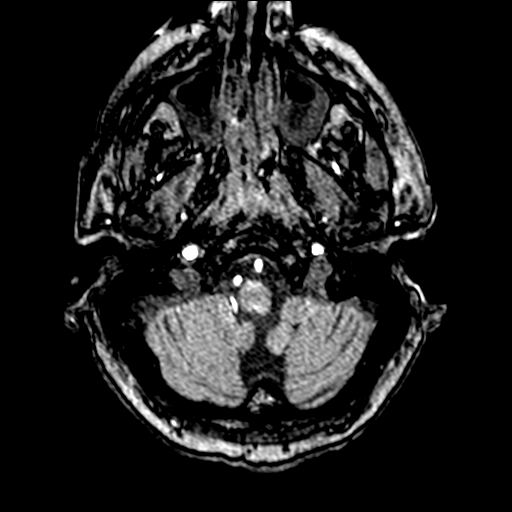
[im 28/188]
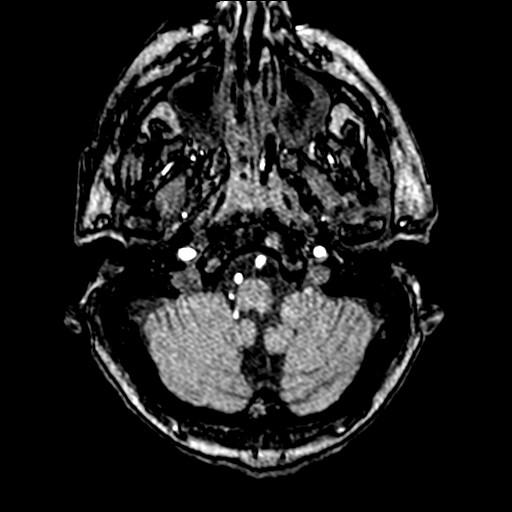
[im 32/188]
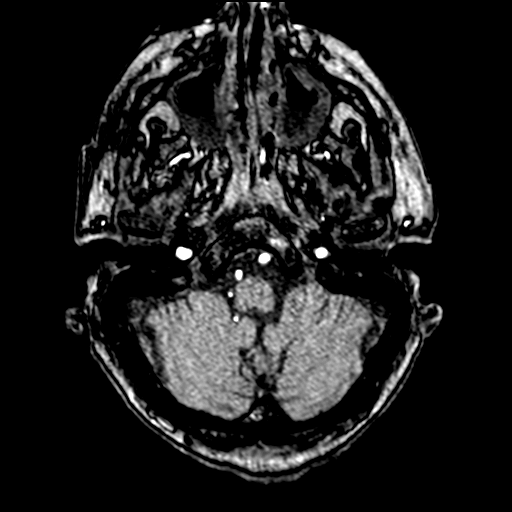
[im 36/188]
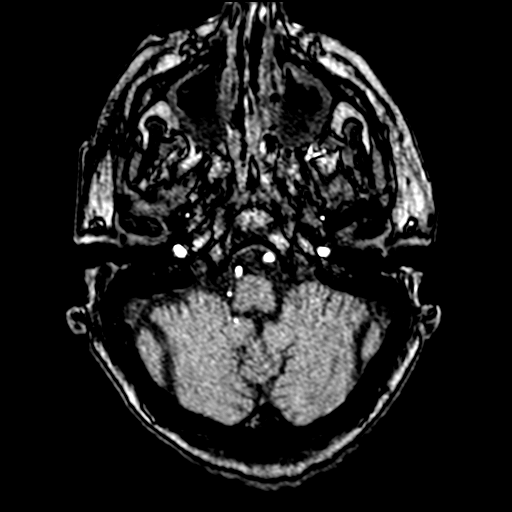
[im 40/188]
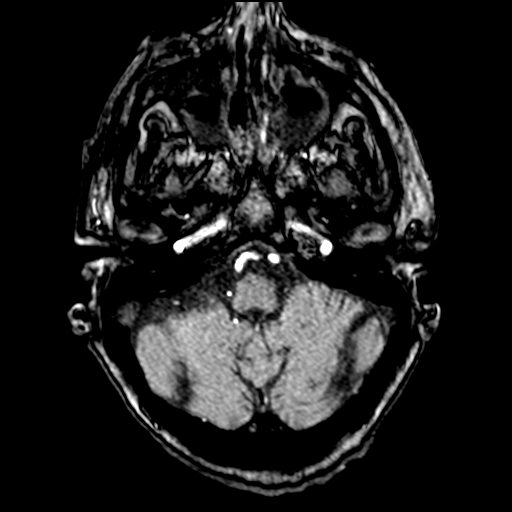
[im 44/188]
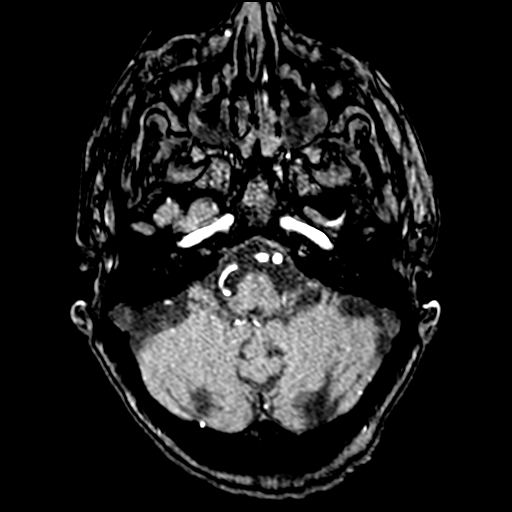
[im 60/188]
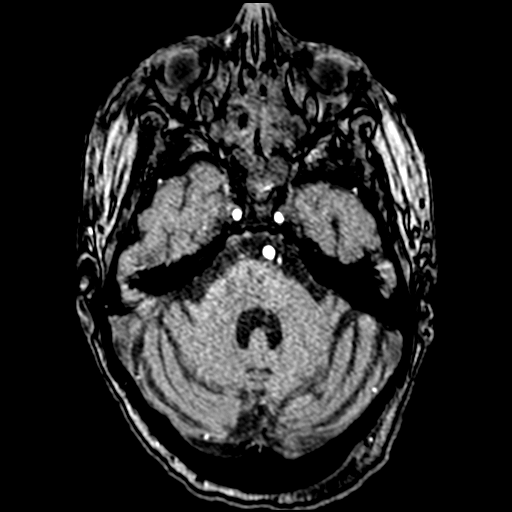
[im 84/188]
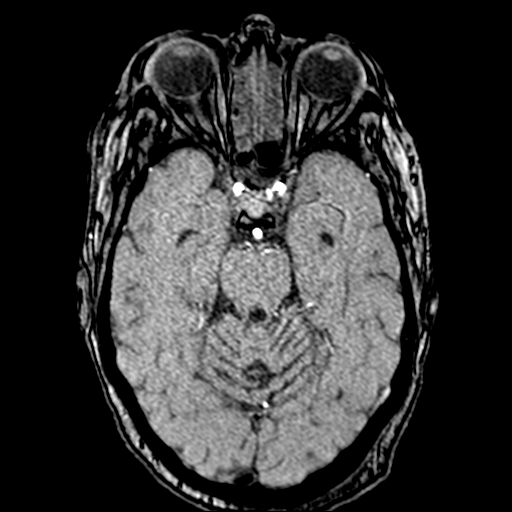
[im 96/188]
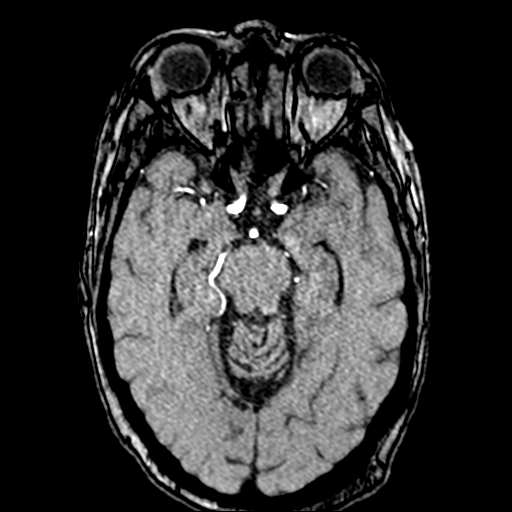
[im 108/188]
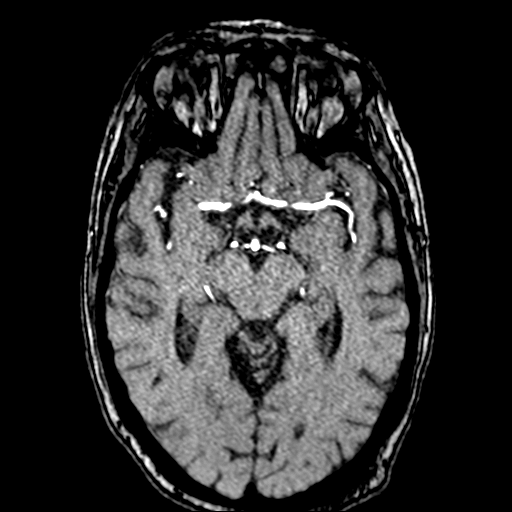
[im 132/188]
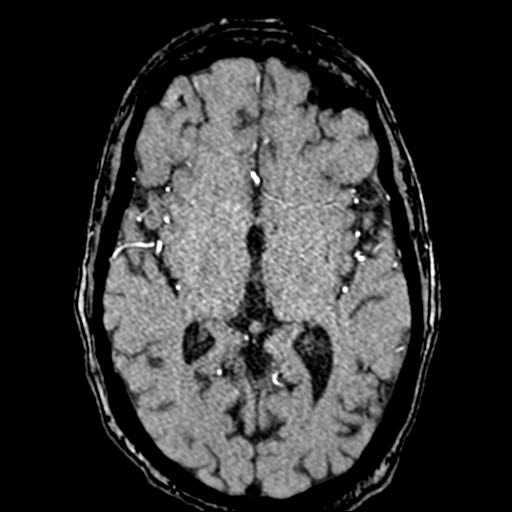
[im 156/188]
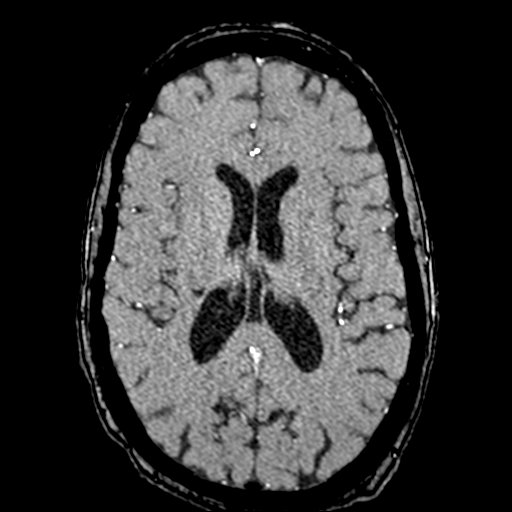
[im 160/188]
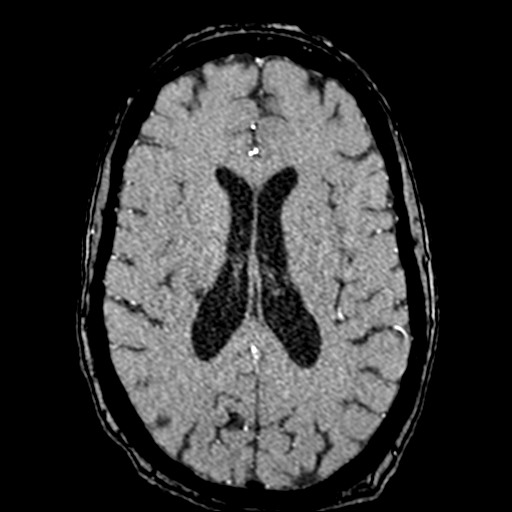
[im 180/188]
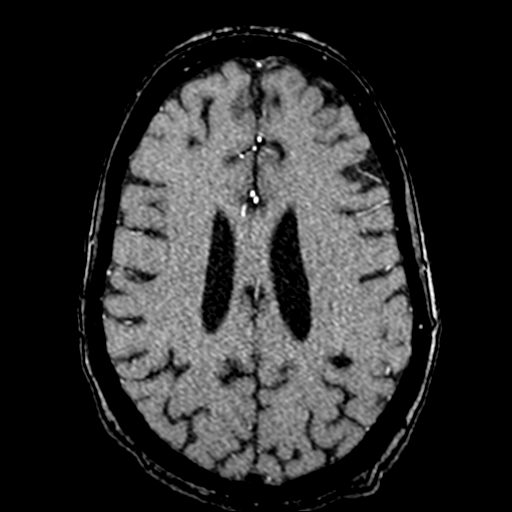

[20 of 48 positions shown; findings below may reference images not displayed]

FINDINGS: Anterior circulation: The internal carotid arteries are widely
patent from skull base to carotid termini. ACAs and MCAs are patent
without evidence of a proximal branch occlusion or significant
proximal stenosis. No aneurysm is identified.

Posterior circulation: The included portions of the intracranial
vertebral arteries are widely patent to the basilar. The right PICA
and left AICA appear dominant. Patent SCA origins are seen
bilaterally. The basilar artery is widely patent. Posterior
communicating arteries are diminutive or absent. Both PCAs are
patent without evidence of a significant proximal stenosis. No
aneurysm is identified.

Anatomic variants: None.
IMPRESSION: Negative head MRA.

## 2021-08-02 MED ORDER — LEVETIRACETAM IN NACL 500 MG/100ML IV SOLN: 500.0000 mg | Freq: Two times a day (BID) | INTRAVENOUS | Status: DC

## 2021-08-02 MED ORDER — DIPHENHYDRAMINE HCL 50 MG/ML IJ SOLN: 25.0000 mg | INTRAMUSCULAR | Status: DC | PRN

## 2021-08-02 MED ORDER — ONDANSETRON 4 MG PO TBDP: 4.0000 mg | ORAL_TABLET | Freq: Four times a day (QID) | ORAL | Status: DC | PRN

## 2021-08-02 MED ORDER — MORPHINE BOLUS VIA INFUSION
5.0000 mg | INTRAVENOUS | Status: DC | PRN
  Filled 2021-08-02: qty 5

## 2021-08-02 MED ORDER — HALOPERIDOL LACTATE 5 MG/ML IJ SOLN: 2.5000 mg | INTRAMUSCULAR | Status: DC | PRN

## 2021-08-02 MED ORDER — LEVETIRACETAM IN NACL 1000 MG/100ML IV SOLN
1000.0000 mg | Freq: Two times a day (BID) | INTRAVENOUS | Status: DC
  Administered 2021-08-02: 1000 mg via INTRAVENOUS
  Filled 2021-08-02: qty 100

## 2021-08-02 MED ORDER — LEVETIRACETAM IN NACL 1000 MG/100ML IV SOLN
1000.0000 mg | INTRAVENOUS | Status: AC
  Administered 2021-08-02 (×2): 1000 mg via INTRAVENOUS
  Filled 2021-08-02 (×2): qty 100

## 2021-08-02 MED ORDER — GLYCOPYRROLATE 0.2 MG/ML IJ SOLN: 0.2000 mg | INTRAMUSCULAR | Status: DC | PRN

## 2021-08-02 MED ORDER — GLYCOPYRROLATE 1 MG PO TABS: 1.0000 mg | ORAL_TABLET | ORAL | Status: DC | PRN

## 2021-08-02 MED ORDER — MIDAZOLAM HCL 2 MG/2ML IJ SOLN: 2.0000 mg | INTRAMUSCULAR | Status: DC | PRN

## 2021-08-02 MED ORDER — ONDANSETRON HCL 4 MG/2ML IJ SOLN: 4.0000 mg | Freq: Four times a day (QID) | INTRAMUSCULAR | Status: DC | PRN

## 2021-08-02 MED ORDER — POLYVINYL ALCOHOL 1.4 % OP SOLN: 1.0000 [drp] | Freq: Four times a day (QID) | OPHTHALMIC | Status: DC | PRN

## 2021-08-02 MED ORDER — MORPHINE 100MG IN NS 100ML (1MG/ML) PREMIX INFUSION
0.0000 mg/h | INTRAVENOUS | Status: DC
  Administered 2021-08-04: 5 mg/h via INTRAVENOUS
  Filled 2021-08-02: qty 100

## 2021-08-02 MED ORDER — LORAZEPAM 2 MG/ML IJ SOLN
2.0000 mg | INTRAMUSCULAR | Status: AC
  Administered 2021-08-02: 2 mg via INTRAVENOUS
  Filled 2021-08-02: qty 1

## 2021-08-02 MED ORDER — MORPHINE SULFATE (PF) 2 MG/ML IV SOLN
2.0000 mg | INTRAVENOUS | Status: DC | PRN
  Administered 2021-08-04: 2 mg via INTRAVENOUS
  Filled 2021-08-02: qty 1

## 2021-08-02 MED ORDER — DEXTROSE 5 % IV SOLN: INTRAVENOUS | Status: DC

## 2021-08-02 MED ORDER — ACETAMINOPHEN 325 MG PO TABS: 650.0000 mg | ORAL_TABLET | Freq: Four times a day (QID) | ORAL | Status: DC | PRN

## 2021-08-02 MED ORDER — SODIUM CHLORIDE 0.9 % IV SOLN: 2000.0000 mg | Freq: Once | INTRAVENOUS | Status: DC

## 2021-08-02 MED ORDER — ACETAMINOPHEN 650 MG RE SUPP: 650.0000 mg | Freq: Four times a day (QID) | RECTAL | Status: DC | PRN

## 2021-08-02 NOTE — Progress Notes (Addendum)
Neurology Progress Note   S:// Seen and examined No acute changes.  O:// Current vital signs: BP 128/67   Pulse 69   Temp 98.8 F (37.1 C) (Oral)   Resp 17   Ht _0  (1.651 m)   Wt 72.2 kg   LMP 08/26/2011   SpO2 93%   BMI 26.49 kg/m  Vital signs in last 24 hours: Temp:  [98.2 F (36.8 C)-99.1 F (37.3 C)] 98.8 F (37.1 C) (11/05 0732) Pulse Rate:  [67-80] 69 (11/05 0600) Resp:  [0-32] 17 (11/05 0600) BP: (111-128)/(67-71) 128/67 (11/04 2037) SpO2:  [89 %-100 %] 93 % (11/05 0600) Arterial Line BP: (88-144)/(40-73) 105/57 (11/05 0600) FiO2 (%):  [50 %-70 %] 60 % (11/05 0600) General: Intubated on no sedation HEENT: Normocephalic/atraumatic Lungs: Vented Abdomen nondistended nontender Lower extremities cyanotic and cold Neurological exam She is intubated, no sedation No spontaneous movements noted To loud voice, she does not open eyes. To sternal rub, she localizes strongly with the left upper extremity and somewhat weaker with the right upper extremity.  To noxious stimulation in the limbs, she attempts withdrawal equally on right upper and lower extremity. Minimal movement to noxious stimulation on bilateral lower extremities with increased tone and both feet appear plantarflexed. Cranial nerves: Pupils are equal round and reactive to light, she has roving eye movements, does not blink to threat from either side, facial symmetry difficult to ascertain due to the endotracheal tube. Motor examination: As documented above Sensory examination: As documented above Coordination cannot be assessed given her mentation  Medications  Current Facility-Administered Medications:    0.9 %  sodium chloride infusion, 250 mL, Intravenous, Continuous, Elsworth Soho, Leanna Sato, MD   Ampicillin-Sulbactam (UNASYN) 3 g in sodium chloride 0.9 % 100 mL IVPB, 3 g, Intravenous, Q12H, Jacky Kindle, MD, Stopped at 08/01/21 2127   chlorhexidine gluconate (MEDLINE KIT) (PERIDEX) 0.12 % solution 15 mL,  15 mL, Mouth Rinse, BID, Kara Mead V, MD, 15 mL at 08/02/21 0732   Chlorhexidine Gluconate Cloth 2 % PADS 6 each, 6 each, Topical, Daily, Kara Mead V, MD, 6 each at 08/01/21 2202   dexamethasone (DECADRON) tablet 6 mg, 6 mg, Per Tube, Daily, Kara Mead V, MD, 6 mg at 08/01/21 0859   docusate (COLACE) 50 MG/5ML liquid 100 mg, 100 mg, Per Tube, BID, Plunkett, Whitney, MD, 100 mg at 08/01/21 2204   docusate (COLACE) 50 MG/5ML liquid 100 mg, 100 mg, Per Tube, BID PRN, Henri Medal, RPH   feeding supplement (VITAL 1.5 CAL) liquid 1,000 mL, 1,000 mL, Per Tube, Continuous, Chand, Sudham, MD, Last Rate: 20 mL/hr at 08/02/21 0330, 1,000 mL at 08/02/21 0330   fentaNYL (SUBLIMAZE) injection 50-200 mcg, 50-200 mcg, Intravenous, Q30 min PRN, Reome, Earle J, RPH, 100 mcg at 08/03/2021 1752   heparin injection 5,000 Units, 5,000 Units, Subcutaneous, Q12H, Kara Mead V, MD, 5,000 Units at 08/01/21 2204   insulin aspart (novoLOG) injection 0-6 Units, 0-6 Units, Subcutaneous, Q4H, Kara Mead V, MD, 1 Units at 08/02/21 0356   levalbuterol (XOPENEX) nebulizer solution 0.63 mg, 0.63 mg, Nebulization, Q6H PRN, Elie Confer, Sarah F, NP   levETIRAcetam (KEPPRA) IVPB 1000 mg/100 mL premix, 1,000 mg, Intravenous, Q12H, Kirkpatrick, Vida Roller, MD   MEDLINE mouth rinse, 15 mL, Mouth Rinse, 10 times per day, Rigoberto Noel, MD, 15 mL at 08/02/21 0548   metoCLOPramide (REGLAN) injection 10 mg, 10 mg, Intravenous, Q6H, Chand, Sudham, MD, 10 mg at 08/02/21 0548   norepinephrine (LEVOPHED) 41m in 2526mpremix  infusion, 0-40 mcg/min, Intravenous, Titrated, Estill Cotta, NP, Last Rate: 15 mL/hr at 08/02/21 0424, 4 mcg/min at 08/02/21 0424   ondansetron (ZOFRAN) injection 4 mg, 4 mg, Intravenous, Q6H PRN, Bowser, Laurel Dimmer, NP   pantoprazole (PROTONIX) injection 40 mg, 40 mg, Intravenous, Q24H, Henri Medal, RPH   polyethylene glycol (MIRALAX / GLYCOLAX) packet 17 g, 17 g, Per Tube, Daily, Plunkett, Whitney, MD, 17 g at  08/01/21 0852   polyethylene glycol (MIRALAX / GLYCOLAX) packet 17 g, 17 g, Per Tube, Daily PRN, Rigoberto Noel, MD   sodium chloride flush (NS) 0.9 % injection 10-40 mL, 10-40 mL, Intracatheter, Q12H, Kara Mead V, MD, 10 mL at 08/01/21 2227   sodium chloride flush (NS) 0.9 % injection 10-40 mL, 10-40 mL, Intracatheter, PRN, Rigoberto Noel, MD, 10 mL at 07/30/21 0917 Labs CBC    Component Value Date/Time   WBC 6.2 08/02/2021 0429   RBC 2.82 (L) 08/02/2021 0429   HGB 9.5 (L) 08/02/2021 0429   HCT 27.6 (L) 08/02/2021 0429   PLT 114 (L) 08/02/2021 0429   MCV 97.9 08/02/2021 0429   MCH 33.7 08/02/2021 0429   MCHC 34.4 08/02/2021 0429   RDW 11.7 08/02/2021 0429   LYMPHSABS 3.1 08/08/2021 1226   MONOABS 0.4 08/22/2021 1226   EOSABS 0.3 08/13/2021 1226   BASOSABS 0.0 08/01/2021 1226    CMP     Component Value Date/Time   NA 133 (L) 08/02/2021 0429   K 4.1 08/02/2021 0429   CL 95 (L) 08/02/2021 0429   CO2 24 08/02/2021 0429   GLUCOSE 174 (H) 08/02/2021 0429   BUN 56 (H) 08/02/2021 0429   CREATININE 5.34 (H) 08/02/2021 0429   CALCIUM 7.5 (L) 08/02/2021 0429   PROT 4.4 (L) 08/02/2021 0429   ALBUMIN 2.1 (L) 08/02/2021 0429   AST 1,145 (H) 08/02/2021 0429   ALT 785 (H) 08/02/2021 0429   ALKPHOS 237 (H) 08/02/2021 0429   BILITOT 3.9 (H) 08/02/2021 0429   GFRNONAA 9 (L) 08/02/2021 0429   GFRAA >90 06/16/2014 0518   2D echo with LVEF 25 to 30% with left ventricle showing global hypokinesis. B12 2770 CK 1077  Imaging I have reviewed images in epic and the results pertinent to this consultation are: MRI of the brain shows restricted diffusion in the hippocampus bilaterally.  There is also a small area of acute infarction in the right occipital cortex.  MR imaging of the cervical spine with no cord compression or cord infarct.  Assessment: 55 year old woman presenting with out of hospital V. fib arrest with 3 shocks and 1 epi with intubation with a total of 20 minutes of CPR  prior to ROSC.  Remains encephalopathic in the ICU-MRI imaging was concerning for right occipital and bilateral hippocampal infarction/hypoxia.  Findings can be seen with stroke as well as cocaine/heroin use.  Urinary toxicology screen was negative for opiates and cocaine.  Unclear if there is any fentanyl use. Given the location, posterior circulation should be evaluated by vessel imaging. Her brain imaging does not show changes consistent with florid anoxic brain damage. On examination she also has intact brainstem reflexes and some purposeful withdrawal and localization. She had some antecedent gait abnormality, C-spine imaging has been abnormal.  Lower extremities seem to have minimal withdrawal to noxious stimulation whereas the uppers are somewhat better-looking at thoracic spine MRI would be prudent. Overnight EEG initially showed generalized periodic discharges with triphasic mid morphology and after around 3:45 in the morning EEG improved  and was suggestive of moderate to severe encephalopathy likely secondary to sedation/toxic metabolic etiology/anoxic/hypoxic brain injury.  No seizures seen.  Impression: Post cardiac arrest-evaluate for hypoxic/anoxic brain injury Evaluate for stroke, predominantly posterior circulation/occlusions Evaluate for myelopathy  Recommendations: Discontinue LTM Continue Keppra MRA head and neck without contrast given renal function MRI thoracic spine Medical management per primary team as you are. I will follow with you.   -- Amie Portland, MD Neurologist [facility] Pager: [phone]  [place] Performed by: Amie Portland, MD Total critical care time: 33 minutes Critical care time was exclusive of separately billable procedures and treating other patients and/or supervising APPs/Residents/Students Critical care was necessary to treat or prevent imminent or life-threatening deterioration. This patient is critically ill  and at significant risk for neurological worsening and/or death and care requires constant monitoring. Critical care was time spent personally by me on the following activities: development of treatment plan with patient and/or surrogate as well as nursing, discussions with consultants, evaluation of patient's response to treatment, examination of patient, obtaining history from patient or surrogate, ordering and performing treatments and interventions, ordering and review of laboratory studies, ordering and review of radiographic studies, pulse oximetry, re-evaluation of patient's condition, participation in multidisciplinary rounds and medical decision making of high complexity in the care of this patient.

## 2021-08-02 NOTE — Procedures (Addendum)
Patient Name: Tara [name]  MRN: [id number]  Epilepsy Attending: Lora Havens  Referring Physician/Provider: Dr Roland Rack Duration: 08/02/2021 0244 to [phone]  Patient history: 55 year old female status post cardiac arrest. EEG to evaluate for seizure  Level of alertness: comatose  AEDs during EEG study: LEV  Technical aspects: This EEG study was done with scalp electrodes positioned according to the 10-20 International system of electrode placement. Electrical activity was acquired at a sampling rate of 500Hz  and reviewed with a high frequency filter of 70Hz  and a low frequency filter of 1Hz . EEG data were recorded continuously and digitally stored.   Description: EEG showed continuous generalized 3 to 6 Hz theta-delta slowing. Generalized periodic discharges with triphasic morphology at 1-1.5 Hz, more prominent when awake/stimulated. were also noted at the beginning of the study and resolved after around [address] on 08/02/2021. Hyperventilation and photic stimulation were not performed.     ABNORMALITY - Periodic discharges with triphasic morphology, generalized ( GPDs) - Continuous slow, generalized  IMPRESSION: This study initially showed generalized periodic discharges with triphasic morphology which is on the ictal-interictal continuum. After around [place] on 08/02/2021, EEG improved and was suggestive of moderate to severe diffuse encephalopathy, nonspecific etiology but could be secondary to sedation, toxic-metabolic etiology, anoxic/hypoxic brain injury. No seizures were seen throughout the recording.  Lezly Rumpf Barbra Sarks

## 2021-08-02 NOTE — Progress Notes (Signed)
RT note-Patient has been made comfort care, family a the bedside.

## 2021-08-02 NOTE — Progress Notes (Signed)
Family has elected to pursue comfort care and allow patient to pass in peace once remainder of family has seen her.  Erskine Emery MD PCCM

## 2021-08-02 NOTE — Procedures (Signed)
History: 55 year old female status post cardiac arrest  Sedation: None  Technique: This EEG was acquired with electrodes placed according to the International 10-20 electrode system (including Fp1, Fp2, F3, F4, C3, C4, P3, P4, O1, O2, T3, T4, T5, T6, A1, A2, Fz, Cz, Pz). The following electrodes were missing or displaced: none.   Background: There is significant slowing of background frequencies, predominantly consisting of irregular delta and theta frequencies.  There is also an organized posteriorly predominant rhythm of 7 to 8 Hz.  Throughout the study, there are generalized periodic discharges with sharp wave morphology with a frequency of 1 to 1.5 Hz.  This waxes and wanes in amplitude, but without definite evidence of evolution.  With stimulation, the EEG is reactive with evidence of increased faster frequencies and higher amplitudes.  Photic stimulation: Physiologic driving is not performed  EEG Abnormalities: 1) generalized periodic discharges(GPDs) with sharp wave morphology without evolution 2) generalized irregular slow activity  Clinical Interpretation: This EEG reveals evidence of significant cortical irritability in the form of periodic discharges that are on the ictal-interictal continuum suggesting significant risk for seizures.  Longer-term monitoring for further characterization is recommended.  Roland Rack, MD [facility] [phone]  If 7pm- 7am, please page neurology on call as listed in [place].

## 2021-08-02 NOTE — Progress Notes (Addendum)
NAME:  Tara [name], MRN:  [id number], DOB:  [birth year], LOS: 4 ADMISSION DATE:  08/02/2021, CONSULTATION DATE:  08/14/2021 REFERRING MD:  Maryan Rued, CHIEF COMPLAINT:  Post Cardiac Arrest   History of Present Illness:  Patient is a 55 year old female with a history of hypertension, recent issues with lower extremity weakness, shaking and recent weight loss presenting today as a cardiac arrest.  Patient's husband and EMS give the history.  Husband reports that over the last month or 2 patient has had worsening lower extremity weakness, shakiness, tingling in her legs and saw neurology for the first time today.  They had ordered an MRI and they had come home and she went inside and sat down.  Her husband went out to talk with the plumber and 5 minutes later came back in and noticed she was unresponsive sitting in the chair.  He did not notice that she was breathing and called 911.  He did start bystander CPR.  The call came in at 1115.  Fire arrived at 1118 and on their arrival they initiated CPR.  AED did suggest shock as patient was in V. fib.  She was shocked x2 when EMS arrived she was still in V. fib received 1 mg of epi and a third shock which put her back into sinus rhythm.  She was intubated with a 7.0 tube and did have spontaneous breaths.  Husband denies patient recently having any illnesses or complaining of chest pain or shortness of breath.  For the last month she had complained of fatigue.  She had still been eating and drinking.  She does not drink alcohol regularly but husband reports she does smoke heavily. CPR x 20 minutes  Upon arrival to the ED patient was Covid +, which was an incidental finding Na 130/ K 4.1 initially, then 5.2, Creatinine 1.52, Alk Phos 148, AST 290/ ALT 132/ Total Protein 5.1/ Troponin 48/ Lactate 7.2  She was intubated in the ED at 12:34 pm, ABG at 14:10 was 7.19/ 35/82/15/13/13.6,  This was on a rate of 16. She was overbreathing the vent. Saturation was 93%  WBC  is 9.2/ HGB is 10.8/ platelets are 73K  CT head was negative for acute stroke / global anoxic damage. 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 Poorly responsive. Ongoing cEEG.  Objective   Blood pressure 128/67, pulse 72, temperature 98.8 F (37.1 C), temperature source Oral, resp. rate (!) 26, height _0  (1.651 m), weight 72.2 kg, last menstrual period 08/26/2011, SpO2 94 %.    Vent Mode: PRVC FiO2 (%):  [50 %-70 %] 60 % Set Rate:  [26 bmp] 26 bmp Vt Set:  [450 mL] 450 mL PEEP:  [5 cmH20-8 cmH20] 8 cmH20 Plateau Pressure:  [17 cmH20-89 cmH20] 21 cmH20   Intake/Output Summary (Last 24 hours) at 08/02/2021 0926 Last data filed at 08/02/2021 0901 Gross per  24 hour  Intake 487.33 ml  Output 751 ml  Net -263.67 ml    Filed Weights   07/30/21 0500 07/31/21 0500 08/01/21 0500  Weight: 66.6 kg 71.1 kg 72.2 kg    Examination: Ill appearing woman on vent Has lower lip swelling, biting on ETT +Respiratory drive With aggressive stimulation I can get flexion LUE, nothing on RLE/LLE/RUE Pupils reactive, +corneals, +oculocephalic  ABG: 1.7/51/02/58  LFT downtrending Thrombocytopenia improving Creatinine bump   Resolved Hospital Problem list     Assessment & Plan:  Post V Fib Arrest,Shocked x 3 before ROSC'20 minutes CPR , etiology not clear possible primary arrhythmia Post arrest  vs. Pre arrest cardiomyopathy- ischemic eval on hold given mental status Acute respiratory failure in setting of Cardiac Arrest Multiple rib fractures Heavy Smoker, EtOH hx Bibasal pneumonia likely aspiration  Covid + ( Incidental Finding on admission )  Acute anoxic encephalopathy- brainstem reflexes intact, uppers seem intact per neuro, exam complicated by prior spinal cord issues CT Head with no acute stroke or global anoxia EEG negative Prior to admission concern for cervical myelopathy AKI , likely ATN postarrest, worsening Hyponatremia, Hypocalcemia, hypokalemia Shock liver PVD POA, now ischemic toes post arrest- ABIs okay but no flow in toes - Continue vent support - Complete course of unasyn - MRI T spine and MRA head today - cEEG per neurology - Local care to toes - Hold sedation - Usual supportive care - Would not offer HD unless we are using to help prognosticate for family - GOC discussion today as it sounds like patient was suffering PTA and may not have wanted this aggressive of intervention  Best Practice (right click and "Reselect all SmartList Selections" daily)   Diet/type: tubefeeds DVT prophylaxis: prophylactic heparin  GI prophylaxis: PPI Lines: Central line Foley:  Yes, and it is still needed Code Status: Limited code Last date of multidisciplinary goals of care discussion [11/2] -mom Vaughan Basta and husband , they agree to no more CPR but continue current life support measures. Will reassess after MRI.  Critical care time: 41 minutes  Erskine Emery MD PCCM

## 2021-08-02 NOTE — Progress Notes (Addendum)
EEG complete - results pending 

## 2021-08-02 NOTE — Progress Notes (Signed)
Nephrology Follow-Up Consult note   Assessment/Recommendations: Tara [name] is a/an 55 y.o. female with a past medical history significant for HTN, weight loss, lower extremity weakness who present w/ cardiac arrest complicated by acute renal failure   Anuric AKI: Crt near normal at baseline. No response to Lasix.  Likely related to cardiac arrest causing ATN. -Care is purely supportive at this time -No acute indication for dialysis at this time but likely will occur in the next 24 to 48 hours.  Continue goals of care conversations at this time to determine whether dialysis would be deemed appropriate and necessary.  If she has poor chance for neurological recovery would not recommend dialysis. -Continue to monitor daily Cr, Dose meds for GFR -Monitor Daily I/Os, Daily weight  -Maintain MAP>65 for optimal renal perfusion.  -Avoid nephrotoxic medications including NSAIDs and Vanc/Zosyn combo   V fib Cardiac arrest: Continue management of electrolyte disturbances as below.  Supportive care per primary team   VDRF: Vent management per primary team   Hypokalemia: Continue with supplementation as needed.  Improved today   Altered mental status: Related to arrest.  Undergoing imaging to determine whether we may see if some improvement or not.   Aspiration pneumonia: Finish out antibiotic course per primary team   COVID positive: Found on admission.  Remdesivir stopped due to LFTs. Continue management per primary team  Hypotension/shock: On norepinephrine.  Management per primary team   Recommendations conveyed to primary service.    [facility] 08/02/2021 9:44 AM  ___________________________________________________________  CC: Cardiac arrest  Interval History/Subjective: Patient is off sedation and not responsive today.  On a little norepinephrine overnight.   Medications:  Current Facility-Administered Medications  Medication Dose Route  Frequency Provider Last Rate Last Admin   0.9 %  sodium chloride infusion  250 mL Intravenous Continuous Rigoberto Noel, MD 10 mL/hr at 08/02/21 0914 Infusion Verify at 08/02/21 0914   Ampicillin-Sulbactam (UNASYN) 3 g in sodium chloride 0.9 % 100 mL IVPB  3 g Intravenous Q12H Jacky Kindle, MD   Stopped at 08/02/21 0906   chlorhexidine gluconate (MEDLINE KIT) (PERIDEX) 0.12 % solution 15 mL  15 mL Mouth Rinse BID Rigoberto Noel, MD   15 mL at 08/02/21 0732   Chlorhexidine Gluconate Cloth 2 % PADS 6 each  6 each Topical Daily Rigoberto Noel, MD   6 each at 08/01/21 2202   dexamethasone (DECADRON) tablet 6 mg  6 mg Per Tube Daily Rigoberto Noel, MD   6 mg at 08/02/21 0827   docusate (COLACE) 50 MG/5ML liquid 100 mg  100 mg Per Tube BID Blanchie Dessert, MD   100 mg at 08/02/21 0828   docusate (COLACE) 50 MG/5ML liquid 100 mg  100 mg Per Tube BID PRN Henri Medal, RPH       feeding supplement (VITAL 1.5 CAL) liquid 1,000 mL  1,000 mL Per Tube Continuous Jacky Kindle, MD 20 mL/hr at 08/02/21 0330 1,000 mL at 08/02/21 0330   fentaNYL (SUBLIMAZE) injection 50-200 mcg  50-200 mcg Intravenous Q30 min PRN Reome, Earle J, RPH   100 mcg at 08/25/2021 1752   heparin injection 5,000 Units  5,000 Units Subcutaneous Q12H Kara Mead V, MD   5,000 Units at 08/02/21 0827   insulin aspart (novoLOG) injection 0-6 Units  0-6 Units Subcutaneous Q4H Rigoberto Noel, MD   1 Units at 08/02/21 0356   levalbuterol (XOPENEX) nebulizer solution 0.63 mg  0.63 mg Nebulization Q6H  PRN Magdalen Spatz, NP       levETIRAcetam (KEPPRA) IVPB 1000 mg/100 mL premix  1,000 mg Intravenous Q12H Greta Doom, MD   Stopped at 08/02/21 0850   MEDLINE mouth rinse  15 mL Mouth Rinse 10 times per day Rigoberto Noel, MD   15 mL at 08/02/21 0548   metoCLOPramide (REGLAN) injection 10 mg  10 mg Intravenous Q6H Jacky Kindle, MD   10 mg at 08/02/21 0548   norepinephrine (LEVOPHED) 4mg  in 281mL premix infusion  0-40 mcg/min Intravenous  Titrated Estill Cotta, NP 15 mL/hr at 08/02/21 0914 4 mcg/min at 08/02/21 0914   ondansetron (ZOFRAN) injection 4 mg  4 mg Intravenous Q6H PRN Cristal Generous, NP       pantoprazole (PROTONIX) injection 40 mg  40 mg Intravenous Q24H Henri Medal, RPH   40 mg at 08/02/21 0827   polyethylene glycol (MIRALAX / GLYCOLAX) packet 17 g  17 g Per Tube Daily Blanchie Dessert, MD   17 g at 08/02/21 0827   polyethylene glycol (MIRALAX / GLYCOLAX) packet 17 g  17 g Per Tube Daily PRN Rigoberto Noel, MD       sodium chloride flush (NS) 0.9 % injection 10-40 mL  10-40 mL Intracatheter Q12H Kara Mead V, MD   20 mL at 08/02/21 0914   sodium chloride flush (NS) 0.9 % injection 10-40 mL  10-40 mL Intracatheter PRN Rigoberto Noel, MD   10 mL at 07/30/21 0917      Review of Systems: Unable to obtain due to the patient's altered mental status  Physical Exam: Vitals:   08/02/21 0845 08/02/21 0900  BP:    Pulse: 70 72  Resp: (!) 26 (!) 26  Temp:    SpO2: 94% 94%   Total I/O In: 230 [I.V.:47.1; IV Piggyback:182.9] Out: -   Intake/Output Summary (Last 24 hours) at 08/02/2021 0944 Last data filed at 08/02/2021 0901 Gross per 24 hour  Intake 487.33 ml  Output 751 ml  Net -263.67 ml   General: ill-appearing, lying in bed, not responsive HEENT: eyes closed, no nasal discharge, MMM CV: normal rate, no significant edema Lungs: ventilated, bilateral chest rise, coarse upper airway sounds Abd: soft, non-distended Skin: dusky toes, feet and hands warm, no edema Psych: sedated, not interactive Musculoskeletal: no obvious deformities Neuro: sedated, does not respond   Test Results I personally reviewed new and old clinical labs and radiology tests Lab Results  Component Value Date   NA 133 (L) 08/02/2021   K 4.1 08/02/2021   CL 95 (L) 08/02/2021   CO2 24 08/02/2021   BUN 56 (H) 08/02/2021   CREATININE 5.34 (H) 08/02/2021   CALCIUM 7.5 (L) 08/02/2021   ALBUMIN 2.1 (L) 08/02/2021   PHOS 3.5  08/02/2021

## 2021-08-02 NOTE — Progress Notes (Signed)
LTM leads removed by nurse for stat MRI. Skin breakdown unknown to tech. Results pending.

## 2021-08-03 DIAGNOSIS — Z66 Do not resuscitate: Secondary | ICD-10-CM

## 2021-08-03 DIAGNOSIS — I469 Cardiac arrest, cause unspecified: Secondary | ICD-10-CM | POA: Diagnosis not present

## 2021-08-03 DIAGNOSIS — J9601 Acute respiratory failure with hypoxia: Secondary | ICD-10-CM | POA: Diagnosis not present

## 2021-08-03 DIAGNOSIS — R570 Cardiogenic shock: Secondary | ICD-10-CM

## 2021-08-03 DIAGNOSIS — G931 Anoxic brain damage, not elsewhere classified: Secondary | ICD-10-CM | POA: Diagnosis not present

## 2021-08-03 DIAGNOSIS — Z7189 Other specified counseling: Secondary | ICD-10-CM

## 2021-08-03 LAB — CBC
HCT: 28 % — ABNORMAL LOW (ref 36.0–46.0)
Hemoglobin: 10 g/dL — ABNORMAL LOW (ref 12.0–15.0)
MCH: 34 pg (ref 26.0–34.0)
MCHC: 35.7 g/dL (ref 30.0–36.0)
MCV: 95.2 fL (ref 80.0–100.0)
Platelets: 113 10*3/uL — ABNORMAL LOW (ref 150–400)
RBC: 2.94 MIL/uL — ABNORMAL LOW (ref 3.87–5.11)
RDW: 11.8 % (ref 11.5–15.5)
WBC: 7.1 10*3/uL (ref 4.0–10.5)
nRBC: 0.3 % — ABNORMAL HIGH (ref 0.0–0.2)

## 2021-08-03 LAB — CULTURE, BLOOD (ROUTINE X 2)
Culture: NO GROWTH
Culture: NO GROWTH
Special Requests: ADEQUATE

## 2021-08-03 LAB — POCT I-STAT 7, (LYTES, BLD GAS, ICA,H+H)
Acid-Base Excess: 2 mmol/L (ref 0.0–2.0)
Bicarbonate: 26.1 mmol/L (ref 20.0–28.0)
Calcium, Ion: 1.08 mmol/L — ABNORMAL LOW (ref 1.15–1.40)
HCT: 33 % — ABNORMAL LOW (ref 36.0–46.0)
Hemoglobin: 11.2 g/dL — ABNORMAL LOW (ref 12.0–15.0)
O2 Saturation: 99 %
Patient temperature: 37.6
Potassium: 4.4 mmol/L (ref 3.5–5.1)
Sodium: 131 mmol/L — ABNORMAL LOW (ref 135–145)
TCO2: 27 mmol/L (ref 22–32)
pCO2 arterial: 40.2 mmHg (ref 32.0–48.0)
pH, Arterial: 7.423 (ref 7.350–7.450)
pO2, Arterial: 127 mmHg — ABNORMAL HIGH (ref 83.0–108.0)

## 2021-08-03 LAB — PHOSPHORUS: Phosphorus: 4.3 mg/dL (ref 2.5–4.6)

## 2021-08-03 NOTE — Progress Notes (Signed)
NAME:  Tara [name], MRN:  [id number], DOB:  [birth year], LOS: 5 ADMISSION DATE:  08/09/2021, CONSULTATION DATE:  08/17/2021 REFERRING MD:  Maryan Rued, CHIEF COMPLAINT:  Post Cardiac Arrest   History of Present Illness:  Patient is a 55 year old female with a history of hypertension, recent issues with lower extremity weakness, shaking and recent weight loss presenting today as a cardiac arrest.  Patient's husband and EMS give the history.  Husband reports that over the last month or 2 patient has had worsening lower extremity weakness, shakiness, tingling in her legs and saw neurology for the first time today.  They had ordered an MRI and they had come home and she went inside and sat down.  Her husband went out to talk with the plumber and 5 minutes later came back in and noticed she was unresponsive sitting in the chair.  He did not notice that she was breathing and called 911.  He did start bystander CPR.  The call came in at 1115.  Fire arrived at 1118 and on their arrival they initiated CPR.  AED did suggest shock as patient was in V. fib.  She was shocked x2 when EMS arrived she was still in V. fib received 1 mg of epi and a third shock which put her back into sinus rhythm.  She was intubated with a 7.0 tube and did have spontaneous breaths.  Husband denies patient recently having any illnesses or complaining of chest pain or shortness of breath.  For the last month she had complained of fatigue.  She had still been eating and drinking.  She does not drink alcohol regularly but husband reports she does smoke heavily. CPR x 20 minutes  Upon arrival to the ED patient was Covid +, which was an incidental finding Na 130/ K 4.1 initially, then 5.2, Creatinine 1.52, Alk Phos 148, AST 290/ ALT 132/ Total Protein 5.1/ Troponin 48/ Lactate 7.2  She was intubated in the ED at 12:34 pm, ABG at 14:10 was 7.19/ 35/82/15/13/13.6,  This was on a rate of 16. She was overbreathing the vent. Saturation was 93%  WBC  is 9.2/ HGB is 10.8/ platelets are 73K  CT head was negative for acute stroke / global anoxic damage. 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 Poorly responsive. Ongoing cEEG.  Objective   Blood pressure 100/69, pulse (!) 59, temperature 98.2 F (36.8 C), resp. rate (!) 26, height $RemoveBe'5\' 5"'DapiGiZlD$  (1.651 m), weight 72.2 kg, last menstrual period 08/26/2011, SpO2 100 %.    Vent Mode: PRVC FiO2 (%):  [60 %-100 %] 60 % Set Rate:  [26 bmp] 26 bmp Vt Set:  [450 mL] 450 mL PEEP:  [10 cmH20] 10 cmH20 Plateau Pressure:  [20 cmH20-22 cmH20] 22 cmH20   Intake/Output Summary (Last 24 hours) at 08/03/2021 0948 Last data filed at 08/03/2021 0700 Gross per 24 hour  Intake 689.76 ml  Output 15 ml  Net 674.76 ml   Filed Weights   07/30/21 0500 07/31/21 0500 08/01/21 0500  Weight: 66.6 kg 71.1 kg 72.2 kg    Examination: Acutely and chronically ill appearing Intubated not sedated but she is not responsive Does not withdraw to pain for me She does have a corneal reflex A line and CVC in place She is not overbreathing the ventilator   ABG 7.4 PCO2 40 PO2 127 CO2 27 WBC 7.1, Hgb 10 down from 11.2 Phos 4.3  Resolved Hospital Problem list     Assessment & Plan:  Post V Fib Arrest,Shocked x 3 before ROSC'20 minutes CPR , etiology not clear possible primary arrhythmia Post arrest vs. Pre arrest  cardiomyopathy- ischemic eval on hold given mental status Acute respiratory failure in setting of Cardiac Arrest Likely cardiogenic shock on pressors Multiple rib fractures Heavy Smoker, EtOH hx Bibasal pneumonia likely aspiration  Covid + ( Incidental Finding on admission )  Acute anoxic encephalopathy- brainstem reflexes intact, uppers seem intact per neuro, exam complicated by prior spinal cord issues CT Head with no acute stroke or global anoxia EEG negative Prior to admission concern for cervical myelopathy AKI , likely ATN postarrest, worsening Hyponatremia, Hypocalcemia, hypokalemia Shock liver PVD POA, now ischemic toes post arrest- ABIs okay but no flow in toes - continue full vent support - continue pressors as needed for goal MAP>65 - continue course of unasyn - discussed with neurology. MRI reviewed and although read as negative. Some concern per them for early ischemic changes.  - Local care to toes - Hold sedation - Usual supportive care - will transition to comfort measures after discussing with family.   Best Practice (right click and "Reselect all SmartList Selections" daily)   Diet/type: tubefeeds DVT prophylaxis: prophylactic heparin  GI prophylaxis: PPI Lines: Central line Foley:  Yes, and it is still needed Code Status: Limited code Last date of multidisciplinary goals of care discussion [-11/5 as documented elsewhere. Plan is to transition to comfort measures at some point, awaiting family.]  Critical Care Time 32 minutes  The patient is critically ill due to respiratory failure, shock, renal failure, cardiac arrest, encephalopathy multi organ failure.  Critical care was necessary to treat or prevent imminent or life-threatening deterioration.  Critical care was time spent personally by me on the following activities: development of treatment plan with patient and/or surrogate as well as nursing, discussions with consultants, evaluation of patient's response  to treatment, examination of patient, obtaining history from patient or surrogate, ordering and performing treatments and interventions, ordering and review of laboratory studies, ordering and review of radiographic studies, pulse oximetry, re-evaluation of patient's condition and participation in multidisciplinary rounds.   Critical Care Time devoted to patient care services described in this note is 32 minutes. This time reflects time of care of this [place] . This critical care time does not reflect separately billable procedures or procedure time, teaching time or supervisory time of PA/NP/Med student/Med Resident etc but could involve care discussion time.       Spero Geralds [facility] 08/03/2021 9:48 AM  Pager: see AMION  If no response to pager , please call critical care on call (see AMION) until 7pm After 7:00 pm call Elink

## 2021-08-03 NOTE — Progress Notes (Signed)
Nephrology Update Note  Family has elected comfort care. We will sign off at this time. If we are needed again in the future please let us know.

## 2021-08-03 NOTE — Progress Notes (Signed)
Transitioning to [place]. Will sign off. Please recall with questions if needed.  -- Amie Portland, MD Neurologist [facility] Pager: [phone]

## 2021-08-03 NOTE — Progress Notes (Addendum)
  PCCM Interval Note  Notified by bedside RN that patient's family (patient's husband and mother) left to go the cafeteria for lunch and since not returned.  Its been several hours.  Plans were to transition to comfort care today pending further arrival of family.  I called husband's and mother's cell, no answer, to discuss.  Generic message left for husband to return call to unit.    Will await further response from family.     Kennieth Rad, ACNP [facility] 08/03/2021, 3:34 PM  See Amion for pager If no response to pager, please call PCCM consult pager After 7:00 pm call Elink     Addendum: 1611, husband at bedside asking for results on 11/5 MRI findings.  Discussed with him that no acute findings were found to suggest a cause for her myelopathy.  Husband states there is one more person coming tomorrow to visit and then plans to transition to comfort.  Husband acknowledges that she has suffered enough and does not want to prolong things.

## 2021-08-03 NOTE — Progress Notes (Signed)
Attempted to call the family twice concerning comfort care measures and their plans. No answer. MD Shearon Stalls notified.

## 2021-08-04 LAB — CBC
HCT: 27.9 % — ABNORMAL LOW (ref 36.0–46.0)
Hemoglobin: 9.9 g/dL — ABNORMAL LOW (ref 12.0–15.0)
MCH: 33.2 pg (ref 26.0–34.0)
MCHC: 35.5 g/dL (ref 30.0–36.0)
MCV: 93.6 fL (ref 80.0–100.0)
Platelets: 121 10*3/uL — ABNORMAL LOW (ref 150–400)
RBC: 2.98 MIL/uL — ABNORMAL LOW (ref 3.87–5.11)
RDW: 12.1 % (ref 11.5–15.5)
WBC: 8.3 10*3/uL (ref 4.0–10.5)
nRBC: 0 % (ref 0.0–0.2)

## 2021-08-04 LAB — PHOSPHORUS: Phosphorus: 4.6 mg/dL (ref 2.5–4.6)

## 2021-08-05 LAB — VITAMIN B1: Vitamin B1 (Thiamine): 105.7 nmol/L (ref 66.5–200.0)

## 2021-08-05 LAB — COPPER, SERUM: Copper: 91 ug/dL (ref 80–158)

## 2021-08-10 LAB — VITAMIN E
Vitamin E (Alpha Tocopherol): 3.1 mg/L — ABNORMAL LOW (ref 7.0–25.1)
Vitamin E(Gamma Tocopherol): 0.8 mg/L (ref 0.5–5.5)

## 2021-08-28 NOTE — Progress Notes (Signed)
NAME:  Tara [name], MRN:  [id number], DOB:  [birth year], LOS: 6 ADMISSION DATE:  08/07/2021, CONSULTATION DATE:  08/13/2021 REFERRING MD:  Maryan Rued, CHIEF COMPLAINT:  Post Cardiac Arrest   History of Present Illness:  Patient is a 55 year old female with a history of hypertension, recent issues with lower extremity weakness, shaking and recent weight loss presenting today as a cardiac arrest.  Patient's husband and EMS give the history.  Husband reports that over the last month or 2 patient has had worsening lower extremity weakness, shakiness, tingling in her legs and saw neurology for the first time today.  They had ordered an MRI and they had come home and she went inside and sat down.  Her husband went out to talk with the plumber and 5 minutes later came back in and noticed she was unresponsive sitting in the chair.  He did not notice that she was breathing and called 911.  He did start bystander CPR.  The call came in at 1115.  Fire arrived at 1118 and on their arrival they initiated CPR.  AED did suggest shock as patient was in V. fib.  She was shocked x2 when EMS arrived she was still in V. fib received 1 mg of epi and a third shock which put her back into sinus rhythm.  She was intubated with a 7.0 tube and did have spontaneous breaths.  Husband denies patient recently having any illnesses or complaining of chest pain or shortness of breath.  For the last month she had complained of fatigue.  She had still been eating and drinking.  She does not drink alcohol regularly but husband reports she does smoke heavily. CPR x 20 minutes  Upon arrival to the ED patient was Covid +, which was an incidental finding Na 130/ K 4.1 initially, then 5.2, Creatinine 1.52, Alk Phos 148, AST 290/ ALT 132/ Total Protein 5.1/ Troponin 48/ Lactate 7.2  She was intubated in the ED at 12:34 pm, ABG at 14:10 was 7.19/ 35/82/15/13/13.6,  This was on a rate of 16. She was overbreathing the vent. Saturation was 93%  WBC  is 9.2/ HGB is 10.8/ platelets are 73K  CT head was negative for acute stroke / global anoxic damage. 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 Unresponsive  Objective   Blood pressure 118/80, pulse 60, temperature 98.1 F (36.7 C), resp. rate (!) 26, height $RemoveBe'5\' 5"'fndoXewPD$  (1.651 m), weight 72.2 kg, last menstrual period 08/26/2011, SpO2 95 %.    Vent Mode: PRVC FiO2 (%):  [30 %-60 %] 60 % Set Rate:  [26 bmp] 26 bmp Vt Set:  [450 mL] 450 mL PEEP:  [10 cmH20] 10 cmH20 Plateau Pressure:  [20 cmH20-22 cmH20] 22 cmH20   Intake/Output Summary (Last 24 hours) at 09-03-21 1102 Last data filed at Sep 03, 2021 0400 Gross  per 24 hour  Intake 405.08 ml  Output 10 ml  Net 395.08 ml   Filed Weights   07/30/21 0500 07/31/21 0500 08/01/21 0500  Weight: 66.6 kg 71.1 kg 72.2 kg    Examination: Chronically ill-appearing Intubated, unresponsive No withdrawal to pain She does have a corneal reflex Clear breath sounds S1-S2 appreciated Bowel sounds appreciated  Labs reviewed  Resolved Hospital Problem list     Assessment & Plan:   Post V. fib arrest -ROSC after 20 minutes of CPR -Etiology of event is unclear, possibly secondary to arrhythmia  Cardiomyopathy Multiple risk factors  Abdominal history of significant smoking, EtOH use  Incidental  COVID positive  Anoxic encephalopathy  CT head with no stroke carbromal anoxia EEG negative.  Shock liver  We will continue discussions with family  Keep sedation on hold Withdrawal of care once family ready Patient remains DNR status  Best Practice (right click and "Reselect all SmartList Selections" daily)   Diet/type: tubefeeds DVT prophylaxis: prophylactic heparin  GI prophylaxis: PPI Lines: Central line Foley:  Yes, and it is still needed Code Status: DNR Last date of multidisciplinary goals of care discussion [-11/5 as documented elsewhere. Plan is to transition to comfort measures at some point, awaiting family.]  The patient is critically ill with multiple organ systems failure and requires high complexity decision making for assessment and support, frequent evaluation and titration of therapies, application of advanced monitoring technologies and extensive interpretation of multiple databases. Critical Care Time devoted to patient care services described in this note independent of APP/resident time (if applicable)  is 30 minutes.   Sherrilyn Rist MD [facility] Personal pager: See Amion If unanswered, please page CCM On-call: [phone]

## 2021-08-28 NOTE — Procedures (Signed)
Extubation Procedure Note  Patient Details:   Name: Tara [name] DOB: 1965/11/14 MRN: [id number]   Airway Documentation:    Vent end date: 2021/08/14 Vent end time: 1323   Evaluation Pt extubated to RA per comfort orders.   Vilinda Blanks Aug 14, 2021, 1:23 PM

## 2021-08-28 NOTE — Death Summary Note (Signed)
DEATH SUMMARY   Patient Details  Name: Tara [name] MRN: [id number] DOB: 1966-02-25  Admission/Discharge Information   Admit Date:  08-23-21  Date of Death: Date of Death: 08-29-2021  Time of Death: Time of Death: 1904/01/07  Length of Stay: 2022/12/27  Referring Physician: Heywood Bene, PA-C   Reason(s) for Hospitalization  Patient was brought into the hospital following a cardiac arrest  Diagnoses  Preliminary cause of death:    Ventricular fibrillation arrest Ischemic cardiomyopathy Secondary Diagnoses (including complications and co-morbidities):  Active Problems:   Ventricular fibrillation (HCC)   Cardiac arrest (HCC)   Lab test positive for detection of COVID-19 virus   Coronary artery calcification Anoxic encephalopathy Shock liver Ischemic cardiomyopathy Deconditioning Failure to thrive Smoker Respiratory failure Acute kidney disease Brief Hospital Course (including significant findings, care, treatment, and services provided and events leading to death)  Tara [name] is a 55 y.o. year old female who sustained out of hospital cardiac arrest, noted to be in V. fib arrest and required shock x3.  Achieved ROSC after about 20 minutes. Spouse had initiated CPR following finding unresponsive Had had issues with fatigue for almost 2 months, had seen neurology on the day of event-concern for cervical spine pathology manifested with gait abnormality and muscle weakness Was intubated in the emergency department In the ED was noted to be COVID-positive which was an incidental finding Initial head CT was negative for stroke or global anoxic injury, no ICH, small chronic lacunar infarct in the right periventricular white matter Hemodynamic support, respiratory support, normothermia protocol Did not require pressors initially, minimal sedation that was weaned Global hypokinesis noted on echocardiogram, started on steroids and remdesivir Concern for anoxic encephalopathy with  duration of CPR, ATN post arrest  With worsening status, multiple conversation had with family especially with regards to anoxic brain injury and poor prognosis of recovery, underlying debility with worsening quality of life prior to event-will not desire ongoing aggressive intervention  Patient was transitioned to comfort measures only  She succumbed to her illness on 08/29/2021 at 1905 hrs.   Pertinent Labs and Studies  Significant Diagnostic Studies DG Abd 1 View  Result Date: 07/31/2021 CLINICAL DATA:  Orogastric tube placement. EXAM: ABDOMEN - 1 VIEW COMPARISON:  Abdominal radiograph 07/30/2021 FINDINGS: Enteric tube tip is below the diaphragm but not included on the image, with side hole projecting at the expected location of the gastric body. Small amount of scattered air is seen within the stomach, small bowel and colon. Retrocardiac left lower lobe opacity. IMPRESSION: Interval advancement of the enteric tube. The tip of the enteric tube is not included on the image but likely terminates at the level of the left mid abdomen. The side hole projects at the expected location of the gastric body. Retrocardiac left lower lobe airspace opacity, likely atelectasis. Electronically Signed   By: Ileana Roup M.D.   On: 07/31/2021 16:05   DG Abd 1 View  Result Date: 07/30/2021 CLINICAL DATA:  OG tube placement EXAM: ABDOMEN - 1 VIEW COMPARISON:  2021/08/23 FINDINGS: Enteric tube terminates in the gastric cardia with its side port in the distal esophagus. IMPRESSION: Enteric tube terminates in the gastric cardia with its side port in the distal esophagus. Electronically Signed   By: Julian Hy M.D.   On: 07/30/2021 00:37   DG Abd 1 View  Result Date: 23-Aug-2021 CLINICAL DATA:  OG tube placement EXAM: ABDOMEN - 1 VIEW COMPARISON:  None. FINDINGS: OG tube loops on itself in the distal esophagus  with the tip in the distal esophagus. Right base atelectasis or infiltrate. Nonobstructive bowel gas  pattern. IMPRESSION: NG tube loops on itself with the tip in the distal esophagus. Electronically Signed   By: Rolm Baptise M.D.   On: 08/21/2021 23:26   CT Head Wo Contrast  Result Date: 08/20/2021 CLINICAL DATA:  Altered mental status, status post cardiac arrest EXAM: CT HEAD WITHOUT CONTRAST TECHNIQUE: Contiguous axial images were obtained from the base of the skull through the vertex without intravenous contrast. COMPARISON:  02/28/2016 FINDINGS: Brain: A 0.7 by 0.5 cm hypodensity favoring chronic lacunar infarct is present in the right periventricular white matter on image 18 series 3. This has a chronic appearance, but was not present on the prior CT scan from 02/28/2016. There is well maintained gray-white differentiation with continued normal conspicuity of the basal ganglia and thalami. Mild progressive sulcal prominence particularly along the vertex, mildly advanced for age. No intracranial hemorrhage, mass lesion, or acute CVA. Vascular: There is atherosclerotic calcification of the cavernous carotid arteries bilaterally. Skull: Unremarkable Sinuses/Orbits: Chronic mucosal thickening in the maxillary sinuses, ethmoid air cells, and sphenoid sinuses noted compatible with chronic sinusitis. There are air fluid levels in the sphenoid sinuses which could reflect superimposed acute sinusitis but may also be alternatively related to the patient's intubation. Other: No supplemental non-categorized findings. IMPRESSION: 1. No compelling findings of acute stroke/global anoxic injury. No intracranial hemorrhage or acute intracranial findings. 2. A small chronic lacunar infarct in the right periventricular white matter is noted. Although this has chronic characteristics, it was not present on the 02/28/2016 CT scan. 3. Mildly age advanced cerebral atrophy manifesting as sulcal prominence along the vertex. 4. Chronic paranasal sinusitis. Air-fluid levels in the sphenoid sinuses could be from intubation or acute  sinusitis. Electronically Signed   By: Van Clines M.D.   On: 08/03/2021 13:42   CT Angio Chest PE W and/or Wo Contrast  Result Date: 08/09/2021 CLINICAL DATA:  Cardiac Arrest; PE suspected, high prob; Patient present in CT on ventilator EXAM: CT ANGIOGRAPHY CHEST WITH CONTRAST TECHNIQUE: Multidetector CT imaging of the chest was performed using the standard protocol during bolus administration of intravenous contrast. Multiplanar CT image reconstructions and MIPs were obtained to evaluate the vascular anatomy. CONTRAST:  64mL OMNIPAQUE IOHEXOL 350 MG/ML SOLN COMPARISON:  Current chest radiograph.  Prior chest CT, 06/14/2014. FINDINGS: Cardiovascular: Pulmonary arteries are well opacified. Study is somewhat degraded by motion. Allowing for the respiratory motion degradation, there is no evidence of a pulmonary embolism. Heart is normal in size and configuration. No pericardial effusion. Great vessels normal in caliber. Aorta is not opacified. Mild descending thoracic aorta atherosclerotic calcifications. Mediastinum/Nodes: Endotracheal tube tip lies 2.8 cm above the carina. Visualized thyroid is unremarkable. No mediastinal or hilar masses or enlarged lymph nodes. Trachea and esophagus are unremarkable. Lungs/Pleura: Near complete consolidation/atelectasis of the lower lobes, with more volume loss noted on the right. Patchy airspace opacities in the upper lobes, greater on the left, ground-glass opacities evident in the right middle lobe. Bilateral interstitial thickening, which is most evident in the upper lobes. No pneumothorax. Upper Abdomen: No acute abnormality. Musculoskeletal: Bilateral anterior rib fractures. On the right, these involve the second, third, fourth, fifth and sixth ribs, and likely the 7. There also more subtle fractures noted on the left, which are mole likely chronic or subacute. Possible subtle sternal fracture, although sternal assessment is limited by significant respiratory  motion. No vertebral fractures. Review of the MIP images confirms the above  findings. IMPRESSION: 1. No convincing pulmonary embolism. Study degraded by respiratory motion. 2. Lower lobes nearly opacified, likely a significant component, if not completely, atelectasis. 3. Additional patchy areas airspace opacities in both upper lobes and the right middle lobe, most evident in the left upper lobe, consistent with multifocal pneumonia, possibly due to areas of edema or lung contusion from CPR. 4. Interstitial thickening.  Suspect interstitial edema. 5. Anterior rib fractures, which are most evident on the right with a appear recent/acute. Left-sided rib fractures may be chronic or subacute. Right-sided fractures may be from CPR. 6. Well-positioned endotracheal tube. Aortic Atherosclerosis (ICD10-I70.0). Electronically Signed   By: Lajean Manes M.D.   On: 08/27/2021 17:26   MR ANGIO HEAD WO CONTRAST  Result Date: 08/02/2021 CLINICAL DATA:  Neuro deficit, acute, stroke suspected. Recent cardiac arrest. EXAM: MRA HEAD WITHOUT CONTRAST TECHNIQUE: Angiographic images of the Circle of Willis were acquired using MRA technique without intravenous contrast. COMPARISON:  No pertinent prior exam. FINDINGS: Anterior circulation: The internal carotid arteries are widely patent from skull base to carotid termini. ACAs and MCAs are patent without evidence of a proximal branch occlusion or significant proximal stenosis. No aneurysm is identified. Posterior circulation: The included portions of the intracranial vertebral arteries are widely patent to the basilar. The right PICA and left AICA appear dominant. Patent SCA origins are seen bilaterally. The basilar artery is widely patent. Posterior communicating arteries are diminutive or absent. Both PCAs are patent without evidence of a significant proximal stenosis. No aneurysm is identified. Anatomic variants: None. IMPRESSION: Negative head MRA. Electronically Signed   By: Logan Bores M.D.   On: 08/02/2021 12:25   MR ANGIO NECK WO CONTRAST  Result Date: 08/02/2021 CLINICAL DATA:  Stroke, follow up. Recent cardiac arrest. EXAM: MRA NECK WITHOUT CONTRAST TECHNIQUE: Angiographic images of the neck were acquired using MRA technique without intravenous contrast. Carotid stenosis measurements (when applicable) are obtained utilizing NASCET criteria, using the distal internal carotid diameter as the denominator. COMPARISON:  No pertinent prior exam. FINDINGS: Aortic arch: Standard 3 vessel aortic arch. Suboptimal assessment of the proximal arch vessels due to motion and noncontrast technique. Right carotid system: Patent without evidence of stenosis or dissection. Left carotid system: Patent without evidence of stenosis or dissection. Vertebral arteries: The vertebral arteries are patent and codominant with antegrade flow bilaterally. Assessment of the proximal V1 segments, including the vertebral origins, is limited by motion and noncontrast technique, particularly on the right. The more distal V1 segments, V2 segments, and proximal V3 segments are normal in appearance without evidence of a significant stenosis or dissection. Symmetric signal loss in the distal V3 segments is likely technical and related to vessel orientation. IMPRESSION: Patent cervical carotid and vertebral arteries without evidence of a significant stenosis or dissection within above described study limitations. Electronically Signed   By: Logan Bores M.D.   On: 08/02/2021 12:30   MR BRAIN WO CONTRAST  Result Date: 08/01/2021 CLINICAL DATA:  Cardiac arrest three days ago.  Abnormal neuro exam EXAM: MRI HEAD WITHOUT CONTRAST TECHNIQUE: Multiplanar, multiecho pulse sequences of the brain and surrounding structures were obtained without intravenous contrast. COMPARISON:  CT head 08/06/2021 FINDINGS: Brain: 3 mm focus of restricted diffusion right occipital cortex. Restricted diffusion in the body and tail hippocampus  bilaterally. These appear to be related to recent ischemia. Ventricle size normal. Small chronic infarct right parietal lobe. Chronic lacunar infarction in the periventricular white matter on the right. Mild chronic ischemia in the left pons.  Small chronic infarct left cerebellum. Negative for hemorrhage or mass. Vascular: Normal arterial flow voids in the skull base. Skull and upper cervical spine: Negative Sinuses/Orbits: Extensive mucosal edema in the paranasal sinuses. Air-fluid levels in the maxillary sinus bilaterally. Patient intubated. Bilateral mastoid effusion. Negative orbit Other: None IMPRESSION: Small area of acute infarct right occipital cortex. Restricted diffusion in the upper campus bilaterally compatible with recent ischemia Mild chronic ischemic change. Mucosal edema in the paranasal sinuses with air-fluid levels. Electronically Signed   By: Franchot Gallo M.D.   On: 08/01/2021 12:41   MR CERVICAL SPINE WO CONTRAST  Result Date: 08/01/2021 CLINICAL DATA:  Left arm weakness.  Cardiac arrest 3 days prior EXAM: MRI CERVICAL SPINE WITHOUT CONTRAST TECHNIQUE: Multiplanar, multisequence MR imaging of the cervical spine was performed. No intravenous contrast was administered. COMPARISON:  None. FINDINGS: Alignment: Normal alignment.  Straightening of the cervical lordosis Vertebrae: Negative for fracture or mass Cord: Normal signal and morphology. Posterior Fossa, vertebral arteries, paraspinal tissues: Patient is intubated. Mucosal edema throughout the paranasal sinuses. Air-fluid levels in the maxillary sinus bilaterally. Negative for soft tissue mass in the neck. Disc levels: C2-3: Negative C3-4: Mild uncinate spurring on the left with mild left foraminal narrowing C4-5: Negative C5-6: Mild disc and mild facet degeneration. Mild foraminal narrowing bilaterally C6-7: No significant stenosis.  Mild disc degeneration. C7-T1: Negative IMPRESSION: Negative for cord compression or cord infarct. Mild  cervical spine degenerative change Sinusitis with air-fluid levels. Electronically Signed   By: Franchot Gallo M.D.   On: 08/01/2021 12:44   MR THORACIC SPINE WO CONTRAST  Result Date: 08/02/2021 CLINICAL DATA:  Myelopathy, acute or progressive. Recent cardiac arrest. EXAM: MRI THORACIC SPINE WITHOUT CONTRAST TECHNIQUE: Multiplanar, multisequence MR imaging of the thoracic spine was performed. No intravenous contrast was administered. COMPARISON:  None. FINDINGS: Alignment:  Normal. Vertebrae: No fracture or suspicious marrow lesion. Mild degenerative endplate edema at T0-16. Cord:  Normal signal and morphology. Paraspinal and other soft tissues: Small bilateral pleural effusions with bilateral lower lobe atelectasis or consolidation. Disc levels: Small left paracentral disc protrusions at T6-7 and T7-8 and mild disc bulging at T8-9, T9-10, and T10-11 without spinal cord compression or significant stenosis. IMPRESSION: 1. Normal appearance of the thoracic spinal cord. 2. Mild thoracic disc degeneration without stenosis. 3. Small bilateral pleural effusions with bilateral lower lobe atelectasis or consolidation. Electronically Signed   By: Logan Bores M.D.   On: 08/02/2021 12:35   DG Chest Port 1 View  Result Date: 08/01/2021 CLINICAL DATA:  Respiratory failure EXAM: PORTABLE CHEST 1 VIEW COMPARISON:  07/30/2021 FINDINGS: Endotracheal tube 2 cm above the carina unchanged. Right jugular central venous catheter in the proximal SVC unchanged. NG tube enters the stomach. Progression of bibasilar airspace disease, left greater than right. Small pleural effusions. Pulmonary vascular congestion with mild progression. No pneumothorax. IMPRESSION: Pulmonary vascular congestion with bilateral airspace disease with disease which may be edema or pneumonia. Progressive bibasilar atelectasis/infiltrate. Electronically Signed   By: Franchot Gallo M.D.   On: 08/01/2021 07:52   DG Chest Port 1 View  Result Date:  07/30/2021 CLINICAL DATA:  Difficulty breathing EXAM: PORTABLE CHEST 1 VIEW COMPARISON:  Previous studies including the examination of 08/10/2021 FINDINGS: Transverse diameter of heart is increased. Central pulmonary vessels are less prominent. There is decrease in interstitial and alveolar densities in parahilar regions. Faint residual ground-glass densities seen in the left upper lung fields, possibly residual pulmonary edema or pneumonitis. Small patchy infiltrates in the medial lower  lung fields. Costophrenic angles are clear. There is no pneumothorax. Tip of endotracheal tube is 2.3 cm above the carina. Tip of enteric tube is seen in the fundus of the stomach with side port at the gastroesophageal junction. Tip of central venous catheter is seen in superior vena cava. IMPRESSION: Cardiomegaly. There is interval decrease in pulmonary vascular congestion. There is interval decrease in interstitial and alveolar densities in the parahilar regions, more so on the left side suggesting resolving pulmonary edema. Residual ground-glass densities in the left upper lung fields may suggest residual pulmonary edema or pneumonitis. Increased density is seen in medial aspects of both lower lung fields suggesting possible atelectasis/pneumonitis. Electronically Signed   By: Elmer Picker M.D.   On: 07/30/2021 08:08   DG CHEST PORT 1 VIEW  Result Date: 08/11/2021 CLINICAL DATA:  Central line placement. EXAM: PORTABLE CHEST 1 VIEW COMPARISON:  Radiograph and CTA earlier today. FINDINGS: There is a new right internal jugular central venous catheter with tip overlying the SVC. No pneumothorax. The endotracheal tube is been retracted, tip now 5.9 cm from the carina. Decreasing opacity in the right upper lobe from prior exam, however worsening of opacity throughout the left hemithorax. Stable heart size and mediastinal contours. Bibasilar opacity better appreciated on prior CT, without interval radiographic change. Slight  improvement in pulmonary edema. IMPRESSION: 1. New right internal jugular central venous catheter with tip overlying the SVC. No pneumothorax. 2. Endotracheal tube has been retracted, tip now 5.9 cm from the carina. 3. Improving right upper lobe aeration, however worsening opacity throughout the left hemithorax. Bibasilar opacification was better appreciated on CT. 4. Improving pulmonary edema. Electronically Signed   By: Keith Rake M.D.   On: 08/19/2021 19:10   DG Chest Port 1 View  Result Date: 08/14/2021 CLINICAL DATA:  Post CPR EXAM: PORTABLE CHEST 1 VIEW COMPARISON:  September 2015 FINDINGS: Low lung volumes. Pulmonary vascular congestion with edema. No pleural effusion. No pneumothorax. Cardiomediastinal contours are within normal limits. Endotracheal tube is just above the carina and retraction is recommended. IMPRESSION: Endotracheal tube just above carina.  Retraction recommended. Pulmonary vascular congestion with edema. Electronically Signed   By: Macy Mis M.D.   On: 08/18/2021 12:43   DG Abd Portable 1V  Result Date: 08/01/2021 CLINICAL DATA:  Feeding tube placement EXAM: PORTABLE ABDOMEN - 1 VIEW COMPARISON:  Abdominal radiograph 07/31/2021 FINDINGS: A feeding tube is present with tip projecting over the expected location of the stomach body, oriented towards the antrum. There is a loop of bowel just below the transverse colon which could also represent colon or dilated small bowel. Indistinct linear opacities at the right lung base along with trace right lateral pleural fluid. Retrocardiac airspace opacity on the left. Bilateral lower rib fractures. IMPRESSION: 1. Feeding tube tip: Stomach body, oriented towards the antrum. 2. Lower rib fractures bilaterally with continued airspace opacity in the lower lobes and trace right pleural fluid. 3. I cannot exclude a dilated loop of small bowel in the central abdomen, although the identified loop is only partially included and could  represent nondilated colon. Electronically Signed   By: Van Clines M.D.   On: 08/01/2021 17:36   EEG adult  Result Date: 08/02/2021 Greta Doom, MD     08/02/2021  2:54 AM History: 55 year old female status post cardiac arrest Sedation: None Technique: This EEG was acquired with electrodes placed according to the International 10-20 electrode system (including Fp1, Fp2, F3, F4, C3, C4, P3, P4, O1, O2, T3,  T4, T5, T6, A1, A2, Fz, Cz, Pz). The following electrodes were missing or displaced: none. Background: There is significant slowing of background frequencies, predominantly consisting of irregular delta and theta frequencies.  There is also an organized posteriorly predominant rhythm of 7 to 8 Hz. Throughout the study, there are generalized periodic discharges with sharp wave morphology with a frequency of 1 to 1.5 Hz.  This waxes and wanes in amplitude, but without definite evidence of evolution.  With stimulation, the EEG is reactive with evidence of increased faster frequencies and higher amplitudes. Photic stimulation: Physiologic driving is not performed EEG Abnormalities: 1) generalized periodic discharges(GPDs) with sharp wave morphology without evolution 2) generalized irregular slow activity Clinical Interpretation: This EEG reveals evidence of significant cortical irritability in the form of periodic discharges that are on the ictal-interictal continuum suggesting significant risk for seizures.  Longer-term monitoring for further characterization is recommended. Roland Rack, MD [facility] [phone] If 7pm- 7am, please page neurology on call as listed in [place].   EEG adult  Result Date: 08/15/2021 Lora Havens, MD     07/30/2021  3:05 AM Patient Name: Tara [name] MRN: [id number] Epilepsy Attending: Lora Havens Referring Physician/Provider: Eric Form, NP Date: 08/11/2021 Duration: 23.47 mins Patient history: 55yo F with ams s/p V fib arrest. EEG to  evaluate for seizure. Level of alertness:  comatose AEDs during EEG study: propofol Technical aspects: This EEG study was done with scalp electrodes positioned according to the 10-20 International system of electrode placement. Electrical activity was acquired at a sampling rate of 500Hz  and reviewed with a high frequency filter of 70Hz  and a low frequency filter of 1Hz . EEG data were recorded continuously and digitally stored. Description: EEG showed continuous generalized background suppression. EEG was not reactive to reactive stimuli. Hyperventilation and photic stimulation were not performed.   ABNORMALITY - Background suppression, generalized IMPRESSION: This study is suggestive of profound diffuse encephalopathy, nonspecific etiology but likely related to sedation, anoxic/hypoxic brain injury. No seizures or epileptiform discharges were seen throughout the recording. Priyanka Barbra Sarks   Overnight EEG with video  Result Date: 08/02/2021 Lora Havens, MD     08/03/2021  8:15 PM Patient Name: Tara [name] MRN: [id number] Epilepsy Attending: Lora Havens Referring Physician/Provider: Dr Roland Rack Duration: 08/02/2021 [id number] [phone] Patient history: 55 year old female status post cardiac arrest. EEG to evaluate for seizure Level of alertness: comatose AEDs during EEG study: LEV Technical aspects: This EEG study was done with scalp electrodes positioned according to the 10-20 International system of electrode placement. Electrical activity was acquired at a sampling rate of 500Hz  and reviewed with a high frequency filter of 70Hz  and a low frequency filter of 1Hz . EEG data were recorded continuously and digitally stored. Description: EEG showed continuous generalized 3 to 6 Hz theta-delta slowing. Generalized periodic discharges with triphasic morphology at 1-1.5 Hz, more prominent when awake/stimulated. were also noted at the beginning of the study and resolved after around [place] on 08/02/2021.  Hyperventilation and photic stimulation were not performed.   ABNORMALITY - Periodic discharges with triphasic morphology, generalized ( GPDs) - Continuous slow, generalized IMPRESSION: This study initially showed generalized periodic discharges with triphasic morphology which is on the ictal-interictal continuum. After around [place] on 08/02/2021, EEG improved and was suggestive of moderate to severe diffuse encephalopathy, nonspecific etiology but could be secondary to sedation, toxic-metabolic etiology, anoxic/hypoxic brain injury. No seizures were seen throughout the recording. Priyanka O Yadav   VAS [place] ABI WITH/WO TBI  Result Date: 08/01/2021  LOWER EXTREMITY DOPPLER STUDY Patient Name:  Tara [name]  Date of Exam:   08/01/2021 Medical Rec #: [id number]     Accession #:    [id number] Date of Birth: [birth year]    Patient Gender: F Patient Age:   52 years Exam Location:  [facility] Procedure:      VAS [place] ABI WITH/WO TBI Referring Phys: RAKESH ALVA --------------------------------------------------------------------------------  Indications: Blue toes. High Risk Factors: Hypertension, current smoker.  Limitations: Today's exam was very technically limited due to patient position,              contracture of left arm, and posturing of bilateral feet. Comparison Study: No prior study Performing Technologist: Maudry Mayhew MHA, RVT, RDCS, RDMS  Examination Guidelines: A complete evaluation includes at minimum, Doppler waveform signals and systolic blood pressure reading at the level of bilateral brachial, anterior tibial, and posterior tibial arteries, when vessel segments are accessible. Bilateral testing is considered an integral part of a complete examination. Photoelectric Plethysmograph (PPG) waveforms and toe systolic pressure readings are included as required and additional duplex testing as needed. Limited examinations for reoccurring indications may be performed as noted.  ABI Findings:  +---------+------------------+-----+----------+-------------------+ Right    Rt Pressure (mmHg)IndexWaveform  Comment             +---------+------------------+-----+----------+-------------------+ Brachial 116                    biphasic  Audibly triphasic   +---------+------------------+-----+----------+-------------------+ PTA      107               0.92 monophasicAudibly multiphasic +---------+------------------+-----+----------+-------------------+ DP       117               1.01 biphasic                      +---------+------------------+-----+----------+-------------------+ Great Toe                       Absent                        +---------+------------------+-----+----------+-------------------+ +---------+----------------+-----+----------+----------------------------------+ Left     Lt Pressure     IndexWaveform  Comment                                     (mmHg)                                                            +---------+----------------+-----+----------+----------------------------------+ Brachial                                Unable to evaluate due to                                                  contracture                        +---------+----------------+-----+----------+----------------------------------+  PTA      111             0.96 monophasic                                   +---------+----------------+-----+----------+----------------------------------+ DP       115             0.99 triphasic                                    +---------+----------------+-----+----------+----------------------------------+ Great Toe                     Absent                                       +---------+----------------+-----+----------+----------------------------------+ +-------+-----------+-----------+------------+------------+ ABI/TBIToday's ABIToday's TBIPrevious ABIPrevious TBI  +-------+-----------+-----------+------------+------------+ Right  1.01       0.00                                +-------+-----------+-----------+------------+------------+ Left   0.99       0.00                                +-------+-----------+-----------+------------+------------+   Summary: Right: Resting right ankle-brachial index is within normal range. No evidence of significant right lower extremity arterial disease. The right toe-brachial index is abnormal. Left: Resting left ankle-brachial index is within normal range. No evidence of significant left lower extremity arterial disease. The left toe-brachial index is abnormal.  *See table(s) above for measurements and observations.  Electronically signed by Monica Martinez MD on 08/01/2021 at 5:16:57 PM.    Final    ECHOCARDIOGRAM COMPLETE  Result Date: 08/11/2021    ECHOCARDIOGRAM REPORT   Patient Name:   Tara [name] Date of Exam: 08/16/2021 Medical Rec #:  [id number]    Height:       65.0 in Accession #:    [id number]   Weight:       139.0 lb Date of Birth:  1966-01-23   BSA:          1.695 m Patient Age:    31 years     BP:           113/95 mmHg Patient Gender: F            HR:           86 bpm. Exam Location:  Inpatient Procedure: 2D Echo, Cardiac Doppler and Color Doppler STAT ECHO Indications:    Cardiac arrest  History:        Patient has no prior history of Echocardiogram examinations.                 Risk Factors:Current Smoker. Cardiac arrest out for 20 minutes.                 ETOH abuse. Covid 19 positive.  Sonographer:    Merrie Roof RDCS Referring Phys: [id number] [place]  1. Left ventricular ejection fraction, by estimation, is 25 to 30%. The left ventricle has severely decreased function. The left ventricle demonstrates global hypokinesis. Left ventricular diastolic  function could not be evaluated.  2. Right ventricular systolic function is low normal. The right ventricular size is normal.  3. The mitral  valve is normal in structure. Trivial mitral valve regurgitation.  4. Aortic valve regurgitation is mild. Mild aortic valve sclerosis is present, with no evidence of aortic valve stenosis. FINDINGS  Left Ventricle: Left ventricular ejection fraction, by estimation, is 25 to 30%. The left ventricle has severely decreased function. The left ventricle demonstrates global hypokinesis. The left ventricular internal cavity size was normal in size. There is no left ventricular hypertrophy. Left ventricular diastolic function could not be evaluated. Right Ventricle: The right ventricular size is normal. Right vetricular wall thickness was not assessed. Right ventricular systolic function is low normal. Left Atrium: Left atrial size was normal in size. Right Atrium: Right atrial size was normal in size. Pericardium: There is no evidence of pericardial effusion. Mitral Valve: The mitral valve is normal in structure. There is mild thickening of the mitral valve leaflet(s). Trivial mitral valve regurgitation. Tricuspid Valve: The tricuspid valve is normal in structure. Tricuspid valve regurgitation is trivial. Aortic Valve: Aortic valve regurgitation is mild. Mild aortic valve sclerosis is present, with no evidence of aortic valve stenosis. Pulmonic Valve: The pulmonic valve was not assessed. Aorta: The aortic root is normal in size and structure. IAS/Shunts: No atrial level shunt detected by color flow Doppler.  LEFT VENTRICLE PLAX 2D LVIDd:         3.70 cm LVIDs:         3.10 cm LV PW:         1.00 cm LV IVS:        1.00 cm LVOT diam:     1.90 cm LVOT Area:     2.84 cm  LV Volumes (MOD) LV vol d, MOD A4C: 83.6 ml LV vol s, MOD A4C: 54.0 ml LV SV MOD A4C:     83.6 ml LEFT ATRIUM             Index        RIGHT ATRIUM           Index LA diam:        3.50 cm 2.07 cm/m   RA Area:     10.30 cm LA Vol (A2C):   57.2 ml 33.75 ml/m  RA Volume:   21.10 ml  12.45 ml/m LA Vol (A4C):   42.7 ml 25.20 ml/m LA Biplane Vol: 52.0 ml 30.68  ml/m   AORTA Ao Root diam: 2.80 cm  SHUNTS Systemic Diam: 1.90 cm Dorris Carnes MD Electronically signed by Dorris Carnes MD Signature Date/Time: 08/08/2021/6:31:43 PM    Final     Microbiology Recent Results (from the past 240 hour(s))  Urine Culture     Status: None   Collection Time: 08/18/2021 12:26 PM   Specimen: Urine, Catheterized  Result Value Ref Range Status   Specimen Description URINE, CATHETERIZED  Final   Special Requests NONE  Final   Culture   Final    NO GROWTH Performed at [facility], [address], [place], [place] [address]    Report Status 07/31/2021 FINAL  Final  Resp Panel by RT-PCR (Flu A&B, Covid) Nasopharyngeal Swab     Status: Abnormal   Collection Time: 08/15/2021 12:46 PM   Specimen: Nasopharyngeal Swab; Nasopharyngeal(NP) swabs in vial transport medium  Result Value Ref Range Status   SARS Coronavirus 2 by RT PCR POSITIVE (A) NEGATIVE Final    Comment: RESULT  CALLED TO, READ BACK BY AND VERIFIED WITH: Felipa Emory RN [id number] 08/17/2021 A BROWNING (NOTE) SARS-CoV-2 target nucleic acids are DETECTED.  The SARS-CoV-2 RNA is generally detectable in upper respiratory specimens during the acute phase of infection. Positive results are indicative of the presence of the identified virus, but do not rule out bacterial infection or co-infection with other pathogens not detected by the test. Clinical correlation with patient history and other diagnostic information is necessary to determine patient infection status. The expected result is Negative.  Fact Sheet for Patients: EntrepreneurPulse.com.au  Fact Sheet for Healthcare Providers: IncredibleEmployment.be  This test is not yet approved or cleared by the [place] FDA and  has been authorized for detection and/or diagnosis of SARS-CoV-2 by FDA under an Emergency Use Authorization (EUA).  This EUA will remain in effect (meaning this test can  be used) for the duration of  the  COVID-19 declaration under Section 564(b)(1) of the Act, 21 U.S.C. section 360bbb-3(b)(1), unless the authorization is terminated or revoked sooner.     Influenza A by PCR NEGATIVE NEGATIVE Final   Influenza B by PCR NEGATIVE NEGATIVE Final    Comment: (NOTE) The Xpert Xpress SARS-CoV-2/FLU/RSV plus assay is intended as an aid in the diagnosis of influenza from Nasopharyngeal swab specimens and should not be used as a sole basis for treatment. Nasal washings and aspirates are unacceptable for Xpert Xpress SARS-CoV-2/FLU/RSV testing.  Fact Sheet for Patients: EntrepreneurPulse.com.au  Fact Sheet for Healthcare Providers: IncredibleEmployment.be  This test is not yet approved or cleared by the [place] FDA and has been authorized for detection and/or diagnosis of SARS-CoV-2 by FDA under an Emergency Use Authorization (EUA). This EUA will remain in effect (meaning this test can be used) for the duration of the COVID-19 declaration under Section 564(b)(1) of the Act, 21 U.S.C. section 360bbb-3(b)(1), unless the authorization is terminated or revoked.  Performed at [facility], [place] [address] [place], [place] [address]   Blood culture (routine x 2)     Status: None   Collection Time: 08/07/2021  3:11 PM   Specimen: BLOOD  Result Value Ref Range Status   Specimen Description BLOOD SITE NOT SPECIFIED  Final   Special Requests   Final    BOTTLES DRAWN AEROBIC AND ANAEROBIC Blood Culture adequate volume   Culture   Final    NO GROWTH 5 DAYS Performed at [facility], [address], [place], [place] [address]    Report Status 08/03/2021 FINAL  Final  Blood culture (routine x 2)     Status: None   Collection Time: 08/23/2021  6:36 PM   Specimen: BLOOD  Result Value Ref Range Status   Specimen Description BLOOD SITE NOT SPECIFIED  Final   Special Requests   Final    BOTTLES DRAWN AEROBIC ONLY Blood Culture results may not be  optimal due to an inadequate volume of blood received in culture bottles   Culture   Final    NO GROWTH 5 DAYS Performed at [facility], [place] [address], [place],  [address]    Report Status 08/03/2021 FINAL  Final  MRSA Next Gen by PCR, Nasal     Status: None   Collection Time: 08/12/2021  8:12 PM   Specimen: Nasal Mucosa; Nasal Swab  Result Value Ref Range Status   MRSA by PCR Next Gen NOT DETECTED NOT DETECTED Final    Comment: (NOTE) The GeneXpert MRSA Assay (FDA approved for NASAL specimens  only), is one component of a comprehensive MRSA colonization surveillance program. It is not intended to diagnose MRSA infection nor to guide or monitor treatment for MRSA infections. Test performance is not FDA approved in patients less than 32 years old. Performed at [facility], [place] [address] [place], [place] [address]     Lab Basic Metabolic Panel: Recent Labs  Lab 07/31/21 0105 08/01/21 0357 08/02/21 0349 08/02/21 0429 08/03/21 0013 08/03/21 0500 09-02-21 0301  NA 131* 132*  131* 133* 133* 131*  --   --   K 3.9 3.4*  3.4* 4.4 4.1 4.4  --   --   CL 97* 94*  --  95*  --   --   --   CO2 20* 22  --  24  --   --   --   GLUCOSE 126* 138*  --  174*  --   --   --   BUN 23* 36*  --  56*  --   --   --   CREATININE 2.93* 4.22*  --  5.34*  --   --   --   CALCIUM 7.3* 7.0*  --  7.5*  --   --   --   MG  --   --   --  1.9  --   --   --   PHOS 3.9 3.6  --  3.5  --  4.3 4.6   Liver Function Tests: Recent Labs  Lab 07/31/21 0105 08/01/21 0357 08/02/21 0429  AST >10,000* 3,326* 1,145*  ALT 1,600* 1,128* 785*  ALKPHOS 125 145* 237*  BILITOT 2.7* 3.1* 3.9*  PROT 4.5* 4.3* 4.4*  ALBUMIN 2.3* 2.2* 2.1*   No results for input(s): LIPASE, AMYLASE in the last 168 hours. No results for input(s): AMMONIA in the last 168 hours. CBC: Recent Labs  Lab 07/31/21 0105 08/01/21 0357 08/02/21 0349 08/02/21 0429 08/03/21 0013 08/03/21 0500 September 02, 2021 0301  WBC 3.0*  3.2*  --  6.2  --  7.1 8.3  HGB 9.6* 9.6*  9.9* 10.9* 9.5* 11.2* 10.0* 9.9*  HCT 29.0* 27.6*  29.0* 32.0* 27.6* 33.0* 28.0* 27.9*  MCV 100.7* 98.2  --  97.9  --  95.2 93.6  PLT 47* 75*  --  114*  --  113* 121*   Cardiac Enzymes: Recent Labs  Lab 08/01/21 1100 08/02/21 0429  CKTOTAL 1,684* 1,077*   Sepsis Labs: Recent Labs  Lab 08/01/21 0357 08/02/21 0429 08/03/21 0500 September 02, 2021 0301  WBC 3.2* 6.2 7.1 8.3    Procedures/Operations     Jeffree Cazeau A [place] Caporaso 08/06/2021, 2:25 PM

## 2021-08-28 NOTE — Progress Notes (Signed)
Nutrition Brief Note  Chart reviewed. Per RN and notes, pt transitioning to comfort care later today once family arrives. No further nutrition interventions planned at this time.  Please re-consult as needed.    Gustavus Bryant, MS, RD, LDN Inpatient Clinical Dietitian Please see AMiON for contact information.

## 2021-08-28 DEATH — deceased
